# Patient Record
Sex: Male | Born: 1950 | Race: Black or African American | Hispanic: No | Marital: Married | Smoking: Never smoker
Health system: Southern US, Community
[De-identification: ages and names within clinical notes are randomized; demographics above are authoritative.]

## PROBLEM LIST (undated history)

## (undated) DIAGNOSIS — I10 Essential (primary) hypertension: Secondary | ICD-10-CM

## (undated) DIAGNOSIS — E119 Type 2 diabetes mellitus without complications: Secondary | ICD-10-CM

## (undated) DIAGNOSIS — E785 Hyperlipidemia, unspecified: Secondary | ICD-10-CM

## (undated) DIAGNOSIS — N400 Enlarged prostate without lower urinary tract symptoms: Secondary | ICD-10-CM

## (undated) HISTORY — DX: Type 2 diabetes mellitus without complications: E11.9

## (undated) HISTORY — DX: Essential (primary) hypertension: I10

## (undated) HISTORY — DX: Benign prostatic hyperplasia without lower urinary tract symptoms: N40.0

## (undated) HISTORY — DX: Hyperlipidemia, unspecified: E78.5

---

## 2012-11-16 ENCOUNTER — Encounter: Payer: Self-pay | Admitting: *Deleted

## 2013-01-29 ENCOUNTER — Encounter: Payer: Self-pay | Admitting: Nurse Practitioner

## 2013-01-29 ENCOUNTER — Ambulatory Visit (INDEPENDENT_AMBULATORY_CARE_PROVIDER_SITE_OTHER): Payer: PRIVATE HEALTH INSURANCE | Admitting: Nurse Practitioner

## 2013-01-29 VITALS — BP 159/89 | HR 86 | Temp 97.4°F | Ht 72.0 in | Wt 230.0 lb

## 2013-01-29 DIAGNOSIS — N400 Enlarged prostate without lower urinary tract symptoms: Secondary | ICD-10-CM

## 2013-01-29 DIAGNOSIS — E119 Type 2 diabetes mellitus without complications: Secondary | ICD-10-CM | POA: Insufficient documentation

## 2013-01-29 DIAGNOSIS — E1169 Type 2 diabetes mellitus with other specified complication: Secondary | ICD-10-CM | POA: Insufficient documentation

## 2013-01-29 DIAGNOSIS — I10 Essential (primary) hypertension: Secondary | ICD-10-CM

## 2013-01-29 DIAGNOSIS — E785 Hyperlipidemia, unspecified: Secondary | ICD-10-CM

## 2013-01-29 LAB — COMPLETE METABOLIC PANEL WITH GFR
ALT: 17 U/L (ref 0–53)
Alkaline Phosphatase: 59 U/L (ref 39–117)
Creat: 1.2 mg/dL (ref 0.50–1.35)
Sodium: 137 mEq/L (ref 135–145)
Total Bilirubin: 1.6 mg/dL — ABNORMAL HIGH (ref 0.3–1.2)
Total Protein: 7.2 g/dL (ref 6.0–8.3)

## 2013-01-29 LAB — POCT GLYCOSYLATED HEMOGLOBIN (HGB A1C): Hemoglobin A1C: 6.8

## 2013-01-29 MED ORDER — LISINOPRIL 20 MG PO TABS: 20.0000 mg | ORAL_TABLET | Freq: Every day | ORAL | Status: DC

## 2013-01-29 MED ORDER — ATORVASTATIN CALCIUM 40 MG PO TABS: 40.0000 mg | ORAL_TABLET | Freq: Every day | ORAL | Status: DC

## 2013-01-29 MED ORDER — METFORMIN HCL 500 MG PO TABS: 500.0000 mg | ORAL_TABLET | Freq: Two times a day (BID) | ORAL | Status: DC

## 2013-01-29 MED ORDER — AMLODIPINE BESYLATE 10 MG PO TABS: 10.0000 mg | ORAL_TABLET | Freq: Every day | ORAL | Status: DC

## 2013-01-29 MED ORDER — HYDROCHLOROTHIAZIDE 25 MG PO TABS: 25.0000 mg | ORAL_TABLET | Freq: Every day | ORAL | Status: DC

## 2013-01-29 NOTE — Patient Instructions (Addendum)
Health Maintenance, Males A healthy lifestyle and preventative care can promote health and wellness.  Maintain regular health, dental, and eye exams.  Eat a healthy diet. Foods like vegetables, fruits, whole grains, low-fat dairy products, and lean protein foods contain the nutrients you need without too many calories. Decrease your intake of foods high in solid fats, added sugars, and salt. Get information about a proper diet from your caregiver, if necessary.  Regular physical exercise is one of the most important things you can do for your health. Most adults should get at least 150 minutes of moderate-intensity exercise (any activity that increases your heart rate and causes you to sweat) each week. In addition, most adults need muscle-strengthening exercises on 2 or more days a week.   Maintain a healthy weight. The body mass index (BMI) is a screening tool to identify possible weight problems. It provides an estimate of body fat based on height and weight. Your caregiver can help determine your BMI, and can help you achieve or maintain a healthy weight. For adults 20 years and older:  A BMI below 18.5 is considered underweight.  A BMI of 18.5 to 24.9 is normal.  A BMI of 25 to 29.9 is considered overweight.  A BMI of 30 and above is considered obese.  Maintain normal blood lipids and cholesterol by exercising and minimizing your intake of saturated fat. Eat a balanced diet with plenty of fruits and vegetables. Blood tests for lipids and cholesterol should begin at age 20 and be repeated every 5 years. If your lipid or cholesterol levels are high, you are over 50, or you are a high risk for heart disease, you may need your cholesterol levels checked more frequently.Ongoing high lipid and cholesterol levels should be treated with medicines, if diet and exercise are not effective.  If you smoke, find out from your caregiver how to quit. If you do not use tobacco, do not start.  If you  choose to drink alcohol, do not exceed 2 drinks per day. One drink is considered to be 12 ounces (355 mL) of beer, 5 ounces (148 mL) of wine, or 1.5 ounces (44 mL) of liquor.  Avoid use of street drugs. Do not share needles with anyone. Ask for help if you need support or instructions about stopping the use of drugs.  High blood pressure causes heart disease and increases the risk of stroke. Blood pressure should be checked at least every 1 to 2 years. Ongoing high blood pressure should be treated with medicines if weight loss and exercise are not effective.  If you are 45 to 64 years old, ask your caregiver if you should take aspirin to prevent heart disease.  Diabetes screening involves taking a blood sample to check your fasting blood sugar level. This should be done once every 3 years, after age 45, if you are within normal weight and without risk factors for diabetes. Testing should be considered at a younger age or be carried out more frequently if you are overweight and have at least 1 risk factor for diabetes.  Colorectal cancer can be detected and often prevented. Most routine colorectal cancer screening begins at the age of 50 and continues through age 75. However, your caregiver may recommend screening at an earlier age if you have risk factors for colon cancer. On a yearly basis, your caregiver may provide home test kits to check for hidden blood in the stool. Use of a small camera at the end of a tube,   to directly examine the colon (sigmoidoscopy or colonoscopy), can detect the earliest forms of colorectal cancer. Talk to your caregiver about this at age 50, when routine screening begins. Direct examination of the colon should be repeated every 5 to 10 years through age 75, unless early forms of pre-cancerous polyps or small growths are found.  Hepatitis C blood testing is recommended for all people born from 1945 through 1965 and any individual with known risks for hepatitis C.  Healthy  men should no longer receive prostate-specific antigen (PSA) blood tests as part of routine cancer screening. Consult with your caregiver about prostate cancer screening.  Testicular cancer screening is not recommended for adolescents or adult males who have no symptoms. Screening includes self-exam, caregiver exam, and other screening tests. Consult with your caregiver about any symptoms you have or any concerns you have about testicular cancer.  Practice safe sex. Use condoms and avoid high-risk sexual practices to reduce the spread of sexually transmitted infections (STIs).  Use sunscreen with a sun protection factor (SPF) of 30 or greater. Apply sunscreen liberally and repeatedly throughout the day. You should seek shade when your shadow is shorter than you. Protect yourself by wearing long sleeves, pants, a wide-brimmed hat, and sunglasses year round, whenever you are outdoors.  Notify your caregiver of new moles or changes in moles, especially if there is a change in shape or color. Also notify your caregiver if a mole is larger than the size of a pencil eraser.  A one-time screening for abdominal aortic aneurysm (AAA) and surgical repair of large AAAs by sound wave imaging (ultrasonography) is recommended for ages 65 to 75 years who are current or former smokers.  Stay current with your immunizations. Document Released: 02/05/2008 Document Revised: 11/01/2011 Document Reviewed: 01/04/2011 [employer] Patient Information 2014 [employer].  

## 2013-01-29 NOTE — Progress Notes (Signed)
Subjective:    Patient ID: Jonathon Adkins, male    DOB: Dec 26, 1950, 62 y.o.   MRN: [id number]  Hypertension This is a chronic problem. The current episode started more than 1 year ago. The problem has been waxing and waning since onset. The problem is uncontrolled. Pertinent negatives include no blurred vision, chest pain, headaches, orthopnea, palpitations, peripheral edema or shortness of breath. There are no associated agents to hypertension. Risk factors for coronary artery disease include dyslipidemia, diabetes mellitus, male gender and obesity. Past treatments include diuretics and beta blockers. The current treatment provides moderate improvement. There are no compliance problems.   Hyperlipidemia This is a chronic problem. The current episode started more than 1 year ago. The problem is controlled. Recent lipid tests were reviewed and are normal. Exacerbating diseases include diabetes. Pertinent negatives include no chest pain or shortness of breath. Current antihyperlipidemic treatment includes statins. The current treatment provides significant improvement of lipids. There are no compliance problems.  Risk factors for coronary artery disease include diabetes mellitus, hypertension and male sex.  Diabetes He presents for his follow-up diabetic visit. He has type 2 diabetes mellitus. No MedicAlert identification noted. The initial diagnosis of diabetes was made 2 years ago. His disease course has been stable. There are no hypoglycemic associated symptoms. Pertinent negatives for hypoglycemia include no headaches. Pertinent negatives for diabetes include no blurred vision, no chest pain, no foot paresthesias, no foot ulcerations, no polydipsia, no polyphagia, no polyuria, no visual change and no weight loss. There are no hypoglycemic complications. Symptoms are stable. There are no diabetic complications. Risk factors for coronary artery disease include dyslipidemia, hypertension and male sex. Current  diabetic treatment includes oral agent (monotherapy) and diet. He is compliant with treatment all of the time. His weight is stable. He is following a generally healthy diet. When asked about meal planning, he reported none. He has not had a previous visit with a dietician. He participates in exercise daily. There is no change in his home blood glucose trend. (Patient doesn't check blood sugars at home.) An ACE inhibitor/angiotensin II receptor blocker is not being taken. He does not see a podiatrist.Eye exam current: October 2012- Patient encouraged too make appointment.  BPH Dr. Isabel Caprice- every 6 months- Last PSA was less than 0.4.    Review of Systems  Constitutional: Negative for weight loss.  Eyes: Negative for blurred vision.  Respiratory: Negative for shortness of breath.   Cardiovascular: Negative for chest pain, palpitations and orthopnea.  Endocrine: Negative for polydipsia, polyphagia and polyuria.  Neurological: Negative for headaches.  All other systems reviewed and are negative.       Objective:   Physical Exam  Constitutional: He is oriented to person, place, and time. He appears well-developed and well-nourished.  HENT:  Head: Normocephalic.  Right Ear: External ear normal.  Left Ear: External ear normal.  Nose: Nose normal.  Mouth/Throat: Oropharynx is clear and moist.  Eyes: EOM are normal. Pupils are equal, round, and reactive to light.  Neck: Normal range of motion. Neck supple. No thyromegaly present.  Cardiovascular: Normal rate, regular rhythm, normal heart sounds and intact distal pulses.   No murmur heard. Pulmonary/Chest: Effort normal and breath sounds normal. He has no wheezes. He has no rales.  Abdominal: Soft. Bowel sounds are normal.  Genitourinary:  DRE deferred to Dr. Isabel Caprice  Musculoskeletal: Normal range of motion.  Neurological: He is alert and oriented to person, place, and time.  Skin: Skin is warm and dry.  Psychiatric: He has a normal mood  and affect. His behavior is normal. Judgment and thought content normal.   BP 159/89  Pulse 86  Temp(Src) 97.4 F (36.3 C) (Oral)  Ht 6' (1.829 m)  Wt 230 lb (104.327 kg)  BMI 31.19 kg/m2 Results for orders placed in visit on 01/29/13  POCT GLYCOSYLATED HEMOGLOBIN (HGB A1C)      Result Value Range   Hemoglobin A1C 6.6 %            Assessment & Plan:   1. Diabetes   2. Other and unspecified hyperlipidemia   3. HTN (hypertension)   4. BPH (benign prostatic hyperplasia)    Orders Placed This Encounter  Procedures  . NMR Lipoprofile with Lipids  . COMPLETE METABOLIC PANEL WITH GFR  . POCT glycosylated hemoglobin (Hb A1C)   Meds ordered this encounter  Medications  . DISCONTD: amLODipine (NORVASC) 10 MG tablet    Sig: Take 10 mg by mouth daily.  Marland Kitchen DISCONTD: hydrochlorothiazide (HYDRODIURIL) 25 MG tablet    Sig: Take 25 mg by mouth daily.  Marland Kitchen DISCONTD: metFORMIN (GLUCOPHAGE) 500 MG tablet    Sig: Take 500 mg by mouth 2 (two) times daily with a meal.  . DISCONTD: atorvastatin (LIPITOR) 40 MG tablet    Sig: Take 40 mg by mouth daily.  . cholecalciferol (VITAMIN D) 1000 UNITS tablet    Sig: Take 1,000 Units by mouth daily.  . finasteride (PROSCAR) 5 MG tablet    Sig: Take 5 mg by mouth daily.  Marland Kitchen HYDROcodone-acetaminophen (VICODIN) 5-500 MG per tablet    Sig: Take 1 tablet by mouth every 6 (six) hours as needed for pain.  Marland Kitchen amLODipine (NORVASC) 10 MG tablet    Sig: Take 1 tablet (10 mg total) by mouth daily.    Dispense:  90 tablet    Refill:  1    Order Specific Question:  Supervising Provider    Answer:  Ernestina Penna [[id number]]  . atorvastatin (LIPITOR) 40 MG tablet    Sig: Take 1 tablet (40 mg total) by mouth daily.    Dispense:  90 tablet    Refill:  1    Order Specific Question:  Supervising Provider    Answer:  Ernestina Penna [[id number]]  . hydrochlorothiazide (HYDRODIURIL) 25 MG tablet    Sig: Take 1 tablet (25 mg total) by mouth daily.    Dispense:  90 tablet     Refill:  1    Order Specific Question:  Supervising Provider    Answer:  Ernestina Penna [[id number]]  . metFORMIN (GLUCOPHAGE) 500 MG tablet    Sig: Take 1 tablet (500 mg total) by mouth 2 (two) times daily with a meal.    Dispense:  180 tablet    Refill:  1    Order Specific Question:  Supervising Provider    Answer:  Ernestina Penna [[id number]]  . lisinopril (PRINIVIL,ZESTRIL) 20 MG tablet    Sig: Take 1 tablet (20 mg total) by mouth daily.    Dispense:  90 tablet    Refill:  1    Order Specific Question:  Supervising Provider    Answer:  Ernestina Penna [[id number]]   Started patient on lisinopril Diet and exercise encouraged Follow- up in 3 months  Mary-Margaret Daphine Deutscher, FNP

## 2013-01-30 LAB — NMR LIPOPROFILE WITH LIPIDS
HDL Particle Number: 36 umol/L (ref >=30.5)
HDL Size: 8.7 nm — ABNORMAL LOW (ref >=9.2)
HDL-C: 52 mg/dL (ref >=40)
LDL (calc): 58 mg/dL (ref <100)
LDL Particle Number: 768 nmol/L (ref <1000)
LDL Size: 20.9 nm (ref >20.5)
LP-IR Score: 59 — ABNORMAL HIGH (ref <=45)
VLDL Size: 50.2 nm — ABNORMAL HIGH (ref <=46.6)

## 2013-01-31 ENCOUNTER — Telehealth: Payer: Self-pay | Admitting: *Deleted

## 2013-01-31 NOTE — Telephone Encounter (Signed)
Pt has been on amlodipine and hctz but rx for lisinopril sent in was there a medication change.

## 2013-01-31 NOTE — Telephone Encounter (Signed)
Chart says on amlodipine, HCTZ and lisinopril- Call pharmacy and see how long has been on and who called in rx

## 2013-01-31 NOTE — Telephone Encounter (Signed)
Actually stated patient on lisinopril at appointment because blood pressure was elevated

## 2013-01-31 NOTE — Telephone Encounter (Signed)
PICKED UP AMLODIPINE 5/30- #90 PICKED UP HCTZ 6/9- #90   PICKED UP LISINOPRIL 20 ON 6/9- #90-BUT HASNT GOT THIS SINCE 2011 AND THEN IT WAS 10MG -PER [facility]  ??? WHAT TO DO  MMM TO ADDRESS

## 2013-01-31 NOTE — Telephone Encounter (Signed)
DUP NOTE 

## 2013-05-11 ENCOUNTER — Ambulatory Visit (INDEPENDENT_AMBULATORY_CARE_PROVIDER_SITE_OTHER): Payer: PRIVATE HEALTH INSURANCE | Admitting: Nurse Practitioner

## 2013-05-11 ENCOUNTER — Encounter: Payer: Self-pay | Admitting: Nurse Practitioner

## 2013-05-11 VITALS — BP 149/85 | HR 88 | Temp 98.0°F | Ht 72.0 in | Wt 228.0 lb

## 2013-05-11 DIAGNOSIS — E785 Hyperlipidemia, unspecified: Secondary | ICD-10-CM

## 2013-05-11 DIAGNOSIS — N390 Urinary tract infection, site not specified: Secondary | ICD-10-CM

## 2013-05-11 DIAGNOSIS — I10 Essential (primary) hypertension: Secondary | ICD-10-CM

## 2013-05-11 DIAGNOSIS — E119 Type 2 diabetes mellitus without complications: Secondary | ICD-10-CM

## 2013-05-11 DIAGNOSIS — R300 Dysuria: Secondary | ICD-10-CM

## 2013-05-11 DIAGNOSIS — N400 Enlarged prostate without lower urinary tract symptoms: Secondary | ICD-10-CM

## 2013-05-11 LAB — POCT UA - MICROSCOPIC ONLY: Crystals, Ur, HPF, POC: NEGATIVE

## 2013-05-11 LAB — POCT URINALYSIS DIPSTICK: pH, UA: 5

## 2013-05-11 LAB — POCT GLYCOSYLATED HEMOGLOBIN (HGB A1C): Hemoglobin A1C: 6.9

## 2013-05-11 LAB — POCT UA - MICROALBUMIN: Microalbumin Ur, POC: 100 mg/L

## 2013-05-11 MED ORDER — METFORMIN HCL 500 MG PO TABS: 500.0000 mg | ORAL_TABLET | Freq: Two times a day (BID) | ORAL | Status: DC

## 2013-05-11 MED ORDER — ATORVASTATIN CALCIUM 40 MG PO TABS: 40.0000 mg | ORAL_TABLET | Freq: Every day | ORAL | Status: DC

## 2013-05-11 MED ORDER — HYDROCHLOROTHIAZIDE 25 MG PO TABS: 25.0000 mg | ORAL_TABLET | Freq: Every day | ORAL | Status: DC

## 2013-05-11 MED ORDER — FINASTERIDE 5 MG PO TABS: 5.0000 mg | ORAL_TABLET | Freq: Every day | ORAL | Status: DC

## 2013-05-11 MED ORDER — CIPROFLOXACIN HCL 500 MG PO TABS: 500.0000 mg | ORAL_TABLET | Freq: Two times a day (BID) | ORAL | Status: DC

## 2013-05-11 MED ORDER — AMLODIPINE BESYLATE 10 MG PO TABS: 10.0000 mg | ORAL_TABLET | Freq: Every day | ORAL | Status: DC

## 2013-05-11 NOTE — Progress Notes (Signed)
Subjective:    Patient ID: Jonathon Adkins, male    DOB: 05-Aug-1951, 62 y.o.   MRN: [id number]  Hypertension This is a chronic problem. The current episode started more than 1 year ago. The problem has been waxing and waning since onset. The problem is uncontrolled. Pertinent negatives include no blurred vision, chest pain, headaches, orthopnea, palpitations, peripheral edema or shortness of breath. There are no associated agents to hypertension. Risk factors for coronary artery disease include dyslipidemia, diabetes mellitus, male gender, obesity and family history. Past treatments include diuretics and calcium channel blockers. The current treatment provides moderate improvement. There are no compliance problems.  There is no history of a thyroid problem. There is no history of sleep apnea.  Hyperlipidemia This is a chronic problem. The current episode started more than 1 year ago. The problem is controlled. Recent lipid tests were reviewed and are normal. Exacerbating diseases include diabetes. He has no history of hypothyroidism. Pertinent negatives include no chest pain or shortness of breath. Current antihyperlipidemic treatment includes statins. The current treatment provides significant improvement of lipids. There are no compliance problems.  Risk factors for coronary artery disease include diabetes mellitus, hypertension, male sex, family history and dyslipidemia.  Diabetes He presents for his follow-up diabetic visit. He has type 2 diabetes mellitus. No MedicAlert identification noted. The initial diagnosis of diabetes was made 2 years ago. His disease course has been stable. There are no hypoglycemic associated symptoms. Pertinent negatives for hypoglycemia include no headaches or tremors. Pertinent negatives for diabetes include no blurred vision, no chest pain, no fatigue, no foot paresthesias, no foot ulcerations, no polydipsia, no polyphagia, no polyuria, no visual change and no weight loss.  There are no hypoglycemic complications. Symptoms are stable. There are no diabetic complications. Risk factors for coronary artery disease include dyslipidemia, hypertension, male sex and family history. Current diabetic treatment includes oral agent (monotherapy) and diet. He is compliant with treatment all of the time. His weight is stable. He is following a generally healthy diet. When asked about meal planning, he reported none. He has not had a previous visit with a dietician. He participates in exercise daily. There is no change in his home blood glucose trend. His breakfast blood glucose is taken between 8-9 am. His breakfast blood glucose range is generally 90-110 mg/dl. (Patient doesn't check blood sugars at home.) An ACE inhibitor/angiotensin II receptor blocker is not being taken. He does not see a podiatrist.Eye exam is current (This year ( can't remember date)).  Dysuria  This is a new problem. The current episode started in the past 7 days (Sunday). The problem occurs intermittently. The problem has been gradually improving. The quality of the pain is described as burning. The pain is at a severity of 6/10. The pain is mild. There has been no fever. Associated symptoms include frequency and urgency. Pertinent negatives include no chills, nausea or vomiting. He has tried nothing for the symptoms. There is no history of kidney stones.  BPH Dr. Isabel Caprice- every 6 months- Last PSA was less than 0.4.    Review of Systems  Constitutional: Negative for chills, weight loss and fatigue.  Eyes: Negative for blurred vision.  Respiratory: Negative for shortness of breath.   Cardiovascular: Negative for chest pain, palpitations and orthopnea.  Gastrointestinal: Negative for nausea and vomiting.  Endocrine: Negative for polydipsia, polyphagia and polyuria.  Genitourinary: Positive for dysuria, urgency and frequency.  Neurological: Negative for tremors and headaches.  All other systems reviewed and are  negative.       Objective:   Physical Exam  Constitutional: He is oriented to person, place, and time. He appears well-developed and well-nourished.  HENT:  Head: Normocephalic.  Right Ear: External ear normal.  Left Ear: External ear normal.  Nose: Nose normal.  Mouth/Throat: Oropharynx is clear and moist.  Eyes: EOM are normal. Pupils are equal, round, and reactive to light.  Neck: Normal range of motion. Neck supple. No thyromegaly present.  Cardiovascular: Normal rate, regular rhythm, normal heart sounds and intact distal pulses.   No murmur heard. Pulmonary/Chest: Effort normal and breath sounds normal. He has no wheezes. He has no rales.  Abdominal: Soft. Bowel sounds are normal.  Genitourinary:  DRE deferred to Dr. Isabel Caprice  Musculoskeletal: Normal range of motion.  Neurological: He is alert and oriented to person, place, and time.  Skin: Skin is warm and dry.  Psychiatric: He has a normal mood and affect. His behavior is normal. Judgment and thought content normal.   BP 149/85  Pulse 88  Temp(Src) 98 F (36.7 C) (Oral)  Ht 6' (1.829 m)  Wt 228 lb (103.42 kg)  BMI 30.92 kg/m2 Results for orders placed in visit on 05/11/13  POCT GLYCOSYLATED HEMOGLOBIN (HGB A1C)      Result Value Range   Hemoglobin A1C 6.9%    POCT UA - MICROSCOPIC ONLY      Result Value Range   WBC, Ur, HPF, POC 20-40     RBC, urine, microscopic 30-40     Bacteria, U Microscopic few     Mucus, UA trace     Epithelial cells, urine per micros occ     Crystals, Ur, HPF, POC neg     Casts, Ur, LPF, POC neg     WBC Clumps few    POCT URINALYSIS DIPSTICK      Result Value Range   Color, UA yellow     Clarity, UA cloudy     Glucose, UA neg     Bilirubin, UA neg     Ketones, UA mod     Spec Grav, UA 1.020     Blood, UA mod     pH, UA 5.0     Protein, UA mod     Urobilinogen, UA negative     Nitrite, UA +     Leukocytes, UA moderate (2+)            Assessment & Plan:   1. Diabetes    2. Other and unspecified hyperlipidemia   3. HTN (hypertension)   4. Dysuria   5. BPH (benign prostatic hyperplasia)    Orders Placed This Encounter  Procedures  . Urine culture  . CMP14+EGFR  . NMR, lipoprofile  . POCT glycosylated hemoglobin (Hb A1C)  . POCT UA - Microscopic Only  . POCT urinalysis dipstick  . POCT UA - Microalbumin    Meds ordered this encounter  Medications  . metFORMIN (GLUCOPHAGE) 500 MG tablet    Sig: Take 1 tablet (500 mg total) by mouth 2 (two) times daily with a meal.    Dispense:  180 tablet    Refill:  1    Order Specific Question:  Supervising Provider    Answer:  Ernestina Penna [[id number]]  . hydrochlorothiazide (HYDRODIURIL) 25 MG tablet    Sig: Take 1 tablet (25 mg total) by mouth daily.    Dispense:  90 tablet    Refill:  1    Order Specific Question:  Supervising Provider  Answer:  Ernestina Penna [[id number]]  . finasteride (PROSCAR) 5 MG tablet    Sig: Take 1 tablet (5 mg total) by mouth daily.    Dispense:  90 tablet    Refill:  1  . atorvastatin (LIPITOR) 40 MG tablet    Sig: Take 1 tablet (40 mg total) by mouth daily.    Dispense:  90 tablet    Refill:  1    Order Specific Question:  Supervising Provider    Answer:  Ernestina Penna [[id number]]  . amLODipine (NORVASC) 10 MG tablet    Sig: Take 1 tablet (10 mg total) by mouth daily.    Dispense:  90 tablet    Refill:  1    Order Specific Question:  Supervising Provider    Answer:  Ernestina Penna [[id number]]  . ciprofloxacin (CIPRO) 500 MG tablet    Sig: Take 1 tablet (500 mg total) by mouth 2 (two) times daily.    Dispense:  10 tablet    Refill:  0    Order Specific Question:  Supervising Provider    Answer:  Deborra Medina   Continue all meds Force fluids Diet and exercise encouraged Follow- up in 3 months  Mary-Margaret Daphine Deutscher, FNP

## 2013-05-11 NOTE — Patient Instructions (Signed)
Health Maintenance, Males A healthy lifestyle and preventative care can promote health and wellness.  Maintain regular health, dental, and eye exams.  Eat a healthy diet. Foods like vegetables, fruits, whole grains, low-fat dairy products, and lean protein foods contain the nutrients you need without too many calories. Decrease your intake of foods high in solid fats, added sugars, and salt. Get information about a proper diet from your caregiver, if necessary.  Regular physical exercise is one of the most important things you can do for your health. Most adults should get at least 150 minutes of moderate-intensity exercise (any activity that increases your heart rate and causes you to sweat) each week. In addition, most adults need muscle-strengthening exercises on 2 or more days a week.   Maintain a healthy weight. The body mass index (BMI) is a screening tool to identify possible weight problems. It provides an estimate of body fat based on height and weight. Your caregiver can help determine your BMI, and can help you achieve or maintain a healthy weight. For adults 20 years and older:  A BMI below 18.5 is considered underweight.  A BMI of 18.5 to 24.9 is normal.  A BMI of 25 to 29.9 is considered overweight.  A BMI of 30 and above is considered obese.  Maintain normal blood lipids and cholesterol by exercising and minimizing your intake of saturated fat. Eat a balanced diet with plenty of fruits and vegetables. Blood tests for lipids and cholesterol should begin at age 20 and be repeated every 5 years. If your lipid or cholesterol levels are high, you are over 50, or you are a high risk for heart disease, you may need your cholesterol levels checked more frequently.Ongoing high lipid and cholesterol levels should be treated with medicines, if diet and exercise are not effective.  If you smoke, find out from your caregiver how to quit. If you do not use tobacco, do not start.  If you  choose to drink alcohol, do not exceed 2 drinks per day. One drink is considered to be 12 ounces (355 mL) of beer, 5 ounces (148 mL) of wine, or 1.5 ounces (44 mL) of liquor.  Avoid use of street drugs. Do not share needles with anyone. Ask for help if you need support or instructions about stopping the use of drugs.  High blood pressure causes heart disease and increases the risk of stroke. Blood pressure should be checked at least every 1 to 2 years. Ongoing high blood pressure should be treated with medicines if weight loss and exercise are not effective.  If you are 45 to 64 years old, ask your caregiver if you should take aspirin to prevent heart disease.  Diabetes screening involves taking a blood sample to check your fasting blood sugar level. This should be done once every 3 years, after age 45, if you are within normal weight and without risk factors for diabetes. Testing should be considered at a younger age or be carried out more frequently if you are overweight and have at least 1 risk factor for diabetes.  Colorectal cancer can be detected and often prevented. Most routine colorectal cancer screening begins at the age of 50 and continues through age 75. However, your caregiver may recommend screening at an earlier age if you have risk factors for colon cancer. On a yearly basis, your caregiver may provide home test kits to check for hidden blood in the stool. Use of a small camera at the end of a tube,   to directly examine the colon (sigmoidoscopy or colonoscopy), can detect the earliest forms of colorectal cancer. Talk to your caregiver about this at age 50, when routine screening begins. Direct examination of the colon should be repeated every 5 to 10 years through age 75, unless early forms of pre-cancerous polyps or small growths are found.  Hepatitis C blood testing is recommended for all people born from 1945 through 1965 and any individual with known risks for hepatitis C.  Healthy  men should no longer receive prostate-specific antigen (PSA) blood tests as part of routine cancer screening. Consult with your caregiver about prostate cancer screening.  Testicular cancer screening is not recommended for adolescents or adult males who have no symptoms. Screening includes self-exam, caregiver exam, and other screening tests. Consult with your caregiver about any symptoms you have or any concerns you have about testicular cancer.  Practice safe sex. Use condoms and avoid high-risk sexual practices to reduce the spread of sexually transmitted infections (STIs).  Use sunscreen with a sun protection factor (SPF) of 30 or greater. Apply sunscreen liberally and repeatedly throughout the day. You should seek shade when your shadow is shorter than you. Protect yourself by wearing long sleeves, pants, a wide-brimmed hat, and sunglasses year round, whenever you are outdoors.  Notify your caregiver of new moles or changes in moles, especially if there is a change in shape or color. Also notify your caregiver if a mole is larger than the size of a pencil eraser.  A one-time screening for abdominal aortic aneurysm (AAA) and surgical repair of large AAAs by sound wave imaging (ultrasonography) is recommended for ages 65 to 75 years who are current or former smokers.  Stay current with your immunizations. Document Released: 02/05/2008 Document Revised: 11/01/2011 Document Reviewed: 01/04/2011 [employer] Patient Information 2014 [employer].  

## 2013-05-11 NOTE — Addendum Note (Signed)
Addended by: Prescott Gum on: 05/11/2013 01:05 PM   Modules accepted: Orders

## 2013-05-13 ENCOUNTER — Other Ambulatory Visit: Payer: Self-pay | Admitting: Nurse Practitioner

## 2013-05-13 LAB — CMP14+EGFR
ALT: 21 IU/L (ref 0–44)
Albumin/Globulin Ratio: 1.7 (ref 1.1–2.5)
BUN: 19 mg/dL (ref 8–27)
Calcium: 9.4 mg/dL (ref 8.6–10.2)
Creatinine, Ser: 1.23 mg/dL (ref 0.76–1.27)
GFR calc non Af Amer: 62 mL/min/{1.73_m2} (ref >59)
Globulin, Total: 2.7 g/dL (ref 1.5–4.5)
Glucose: 146 mg/dL — ABNORMAL HIGH (ref 65–99)
Total Protein: 7.2 g/dL (ref 6.0–8.5)

## 2013-05-13 LAB — URINE CULTURE

## 2013-05-13 LAB — NMR, LIPOPROFILE
HDL Cholesterol by NMR: 51 mg/dL (ref >=40)
HDL Particle Number: 32.9 umol/L (ref >=30.5)
Small LDL Particle Number: 504 nmol/L (ref <=527)
Triglycerides by NMR: 61 mg/dL (ref <150)

## 2013-05-13 MED ORDER — LOSARTAN POTASSIUM 50 MG PO TABS: 50.0000 mg | ORAL_TABLET | Freq: Every day | ORAL | Status: DC

## 2013-08-27 ENCOUNTER — Encounter: Payer: Self-pay | Admitting: Nurse Practitioner

## 2013-08-27 ENCOUNTER — Ambulatory Visit (INDEPENDENT_AMBULATORY_CARE_PROVIDER_SITE_OTHER): Payer: PRIVATE HEALTH INSURANCE | Admitting: Nurse Practitioner

## 2013-08-27 VITALS — BP 155/81 | HR 76 | Temp 97.3°F | Ht 72.0 in | Wt 235.0 lb

## 2013-08-27 DIAGNOSIS — I10 Essential (primary) hypertension: Secondary | ICD-10-CM

## 2013-08-27 DIAGNOSIS — E785 Hyperlipidemia, unspecified: Secondary | ICD-10-CM

## 2013-08-27 DIAGNOSIS — E119 Type 2 diabetes mellitus without complications: Secondary | ICD-10-CM

## 2013-08-27 LAB — POCT GLYCOSYLATED HEMOGLOBIN (HGB A1C): Hemoglobin A1C: 6.5

## 2013-08-27 NOTE — Progress Notes (Signed)
Subjective:    Patient ID: Jonathon Adkins, male    DOB: 12-Sep-1950, 63 y.o.   MRN: [id number]  Patient here today for follow up- no changes since last visit.  Hypertension This is a chronic problem. The current episode started more than 1 year ago. The problem has been waxing and waning since onset. The problem is uncontrolled. Pertinent negatives include no blurred vision, chest pain, headaches, orthopnea, palpitations, peripheral edema or shortness of breath. There are no associated agents to hypertension. Risk factors for coronary artery disease include dyslipidemia, diabetes mellitus, male gender, obesity and family history. Past treatments include diuretics and calcium channel blockers. The current treatment provides moderate improvement. There are no compliance problems.  There is no history of a thyroid problem. There is no history of sleep apnea.  Hyperlipidemia This is a chronic problem. The current episode started more than 1 year ago. The problem is controlled. Recent lipid tests were reviewed and are normal. Exacerbating diseases include diabetes. He has no history of hypothyroidism. Pertinent negatives include no chest pain or shortness of breath. Current antihyperlipidemic treatment includes statins. The current treatment provides significant improvement of lipids. There are no compliance problems.  Risk factors for coronary artery disease include diabetes mellitus, hypertension, male sex, family history and dyslipidemia.  Diabetes He presents for his follow-up diabetic visit. He has type 2 diabetes mellitus. No MedicAlert identification noted. The initial diagnosis of diabetes was made 2 years ago. His disease course has been stable. There are no hypoglycemic associated symptoms. Pertinent negatives for hypoglycemia include no headaches or tremors. Pertinent negatives for diabetes include no blurred vision, no chest pain, no fatigue, no foot paresthesias, no foot ulcerations, no polydipsia,  no polyphagia, no polyuria, no visual change and no weight loss. There are no hypoglycemic complications. Symptoms are stable. There are no diabetic complications. Risk factors for coronary artery disease include dyslipidemia, hypertension, male sex and family history. Current diabetic treatment includes oral agent (monotherapy) and diet. He is compliant with treatment all of the time. His weight is stable. He is following a generally healthy diet. When asked about meal planning, he reported none. He has not had a previous visit with a dietician. He participates in exercise daily. There is no change in his home blood glucose trend. His breakfast blood glucose is taken between 8-9 am. His breakfast blood glucose range is generally 90-110 mg/dl. (Patient doesn't check blood sugars at home.) An ACE inhibitor/angiotensin II receptor blocker is not being taken. He does not see a podiatrist.Eye exam is current (This year ( can't remember date)).  Dysuria  This is a new problem. The current episode started in the past 7 days (Sunday). The problem occurs intermittently. The problem has been gradually improving. The quality of the pain is described as burning. The pain is at a severity of 6/10. The pain is mild. There has been no fever. Associated symptoms include frequency and urgency. Pertinent negatives include no chills, nausea or vomiting. He has tried nothing for the symptoms. There is no history of kidney stones.  BPH Dr. Risa Grill- every 6 months- Last PSA was less than 0.4.    Review of Systems  Constitutional: Negative for chills, weight loss and fatigue.  Eyes: Negative for blurred vision.  Respiratory: Negative for shortness of breath.   Cardiovascular: Negative for chest pain, palpitations and orthopnea.  Gastrointestinal: Negative for nausea and vomiting.  Endocrine: Negative for polydipsia, polyphagia and polyuria.  Genitourinary: Positive for dysuria, urgency and frequency.  Neurological:  Negative for tremors and headaches.  All other systems reviewed and are negative.       Objective:   Physical Exam  Constitutional: He is oriented to person, place, and time. He appears well-developed and well-nourished.  HENT:  Head: Normocephalic.  Right Ear: External ear normal.  Left Ear: External ear normal.  Nose: Nose normal.  Mouth/Throat: Oropharynx is clear and moist.  Eyes: EOM are normal. Pupils are equal, round, and reactive to light.  Neck: Normal range of motion. Neck supple. No thyromegaly present.  Cardiovascular: Normal rate, regular rhythm, normal heart sounds and intact distal pulses.   No murmur heard. Pulmonary/Chest: Effort normal and breath sounds normal. He has no wheezes. He has no rales.  Abdominal: Soft. Bowel sounds are normal.  Genitourinary:  DRE deferred to Dr. Risa Grill  Musculoskeletal: Normal range of motion.  Neurological: He is alert and oriented to person, place, and time.  Skin: Skin is warm and dry.  Psychiatric: He has a normal mood and affect. His behavior is normal. Judgment and thought content normal.   BP 155/81  Pulse 76  Temp(Src) 97.3 F (36.3 C) (Oral)  Ht 6' (1.829 m)  Wt 235 lb (106.595 kg)  BMI 31.86 kg/m2 Results for orders placed in visit on 08/27/13  POCT GLYCOSYLATED HEMOGLOBIN (HGB A1C)      Result Value Range   Hemoglobin A1C 6.5            Assessment & Plan:   1. Type II or unspecified type diabetes mellitus without mention of complication, not stated as uncontrolled   2. Essential hypertension, benign   3. Other and unspecified hyperlipidemia    Orders Placed This Encounter  Procedures  . CMP14+EGFR  . NMR, lipoprofile  . POCT glycosylated hemoglobin (Hb A1C)    Hemoccult cards given  Continue all meds Labs pending Diet and exercise encouraged Health maintenance reviewed Follow up in 3 months  [place], FNP

## 2013-08-27 NOTE — Patient Instructions (Signed)
Health Maintenance, Males A healthy lifestyle and preventative care can promote health and wellness.  Maintain regular health, dental, and eye exams.  Eat a healthy diet. Foods like vegetables, fruits, whole grains, low-fat dairy products, and lean protein foods contain the nutrients you need without too many calories. Decrease your intake of foods high in solid fats, added sugars, and salt. Get information about a proper diet from your caregiver, if necessary.  Regular physical exercise is one of the most important things you can do for your health. Most adults should get at least 150 minutes of moderate-intensity exercise (any activity that increases your heart rate and causes you to sweat) each week. In addition, most adults need muscle-strengthening exercises on 2 or more days a week.   Maintain a healthy weight. The body mass index (BMI) is a screening tool to identify possible weight problems. It provides an estimate of body fat based on height and weight. Your caregiver can help determine your BMI, and can help you achieve or maintain a healthy weight. For adults 20 years and older:  A BMI below 18.5 is considered underweight.  A BMI of 18.5 to 24.9 is normal.  A BMI of 25 to 29.9 is considered overweight.  A BMI of 30 and above is considered obese.  Maintain normal blood lipids and cholesterol by exercising and minimizing your intake of saturated fat. Eat a balanced diet with plenty of fruits and vegetables. Blood tests for lipids and cholesterol should begin at age 84 and be repeated every 5 years. If your lipid or cholesterol levels are high, you are over 50, or you are a high risk for heart disease, you may need your cholesterol levels checked more frequently.Ongoing high lipid and cholesterol levels should be treated with medicines, if diet and exercise are not effective.  If you smoke, find out from your caregiver how to quit. If you do not use tobacco, do not start.  Lung  cancer screening is recommended for adults aged 62 80 years who are at high risk for developing lung cancer because of a history of smoking. Yearly low-dose computed tomography (CT) is recommended for people who have at least a 30-pack-year history of smoking and are a current smoker or have quit within the past 15 years. A pack year of smoking is smoking an average of 1 pack of cigarettes a day for 1 year (for example: 1 pack a day for 30 years or 2 packs a day for 15 years). Yearly screening should continue until the smoker has stopped smoking for at least 15 years. Yearly screening should also be stopped for people who develop a health problem that would prevent them from having lung cancer treatment.  If you choose to drink alcohol, do not exceed 2 drinks per day. One drink is considered to be 12 ounces (355 mL) of beer, 5 ounces (148 mL) of wine, or 1.5 ounces (44 mL) of liquor.  Avoid use of street drugs. Do not share needles with anyone. Ask for help if you need support or instructions about stopping the use of drugs.  High blood pressure causes heart disease and increases the risk of stroke. Blood pressure should be checked at least every 1 to 2 years. Ongoing high blood pressure should be treated with medicines if weight loss and exercise are not effective.  If you are 32 to 65 years old, ask your caregiver if you should take aspirin to prevent heart disease.  Diabetes screening involves taking a blood  sample to check your fasting blood sugar level. This should be done once every 3 years, after age 80, if you are within normal weight and without risk factors for diabetes. Testing should be considered at a younger age or be carried out more frequently if you are overweight and have at least 1 risk factor for diabetes.  Colorectal cancer can be detected and often prevented. Most routine colorectal cancer screening begins at the age of 72 and continues through age 62. However, your caregiver may  recommend screening at an earlier age if you have risk factors for colon cancer. On a yearly basis, your caregiver may provide home test kits to check for hidden blood in the stool. Use of a small camera at the end of a tube, to directly examine the colon (sigmoidoscopy or colonoscopy), can detect the earliest forms of colorectal cancer. Talk to your caregiver about this at age 14, when routine screening begins. Direct examination of the colon should be repeated every 5 to 10 years through age 100, unless early forms of pre-cancerous polyps or small growths are found.  Hepatitis C blood testing is recommended for all people born from [birth year] through 1965 and any individual with known risks for hepatitis C.  Healthy men should no longer receive prostate-specific antigen (PSA) blood tests as part of routine cancer screening. Consult with your caregiver about prostate cancer screening.  Testicular cancer screening is not recommended for adolescents or adult males who have no symptoms. Screening includes self-exam, caregiver exam, and other screening tests. Consult with your caregiver about any symptoms you have or any concerns you have about testicular cancer.  Practice safe sex. Use condoms and avoid high-risk sexual practices to reduce the spread of sexually transmitted infections (STIs).  Use sunscreen. Apply sunscreen liberally and repeatedly throughout the day. You should seek shade when your shadow is shorter than you. Protect yourself by wearing long sleeves, pants, a wide-brimmed hat, and sunglasses year round, whenever you are outdoors.  Notify your caregiver of new moles or changes in moles, especially if there is a change in shape or color. Also notify your caregiver if a mole is larger than the size of a pencil eraser.  A one-time screening for abdominal aortic aneurysm (AAA) and surgical repair of large AAAs by sound wave imaging (ultrasonography) is recommended for ages 8 to 68 years who are  current or former smokers.  Stay current with your immunizations. Document Released: 02/05/2008 Document Revised: 12/04/2012 Document Reviewed: 01/04/2011 [facility] Patient Information 2014 [place], [place].

## 2013-08-28 LAB — NMR, LIPOPROFILE
Cholesterol: 148 mg/dL (ref <200)
HDL Cholesterol by NMR: 55 mg/dL (ref >=40)
HDL Particle Number: 37.4 umol/L (ref >=30.5)
LDL PARTICLE NUMBER: 1149 nmol/L — AB (ref <1000)
LDL Size: 20.6 nm (ref >20.5)
LDLC SERPL CALC-MCNC: 80 mg/dL (ref <100)
LP-IR Score: 46 — ABNORMAL HIGH (ref <=45)
Small LDL Particle Number: 675 nmol/L — ABNORMAL HIGH (ref <=527)
TRIGLYCERIDES BY NMR: 64 mg/dL (ref <150)

## 2013-08-28 LAB — CMP14+EGFR
ALT: 18 IU/L (ref 0–44)
AST: 18 IU/L (ref 0–40)
Albumin/Globulin Ratio: 1.4 (ref 1.1–2.5)
Albumin: 4 g/dL (ref 3.6–4.8)
Alkaline Phosphatase: 64 IU/L (ref 39–117)
BILIRUBIN TOTAL: 1 mg/dL (ref 0.0–1.2)
BUN/Creatinine Ratio: 14 (ref 10–22)
BUN: 18 mg/dL (ref 8–27)
CALCIUM: 9.7 mg/dL (ref 8.6–10.2)
CHLORIDE: 103 mmol/L (ref 97–108)
CO2: 25 mmol/L (ref 18–29)
Creatinine, Ser: 1.33 mg/dL — ABNORMAL HIGH (ref 0.76–1.27)
GFR calc Af Amer: 65 mL/min/{1.73_m2} (ref >59)
GFR calc non Af Amer: 56 mL/min/{1.73_m2} — ABNORMAL LOW (ref >59)
Globulin, Total: 2.9 g/dL (ref 1.5–4.5)
Glucose: 161 mg/dL — ABNORMAL HIGH (ref 65–99)
POTASSIUM: 4 mmol/L (ref 3.5–5.2)
SODIUM: 141 mmol/L (ref 134–144)
TOTAL PROTEIN: 6.9 g/dL (ref 6.0–8.5)

## 2013-08-31 ENCOUNTER — Other Ambulatory Visit: Payer: Self-pay

## 2013-09-05 ENCOUNTER — Telehealth: Payer: Self-pay | Admitting: *Deleted

## 2013-09-05 NOTE — Telephone Encounter (Signed)
Labs discussed  

## 2013-10-18 LAB — SPECIMEN STATUS REPORT

## 2013-10-18 LAB — FECAL OCCULT BLOOD, IMMUNOCHEMICAL: FECAL OCCULT BLD: POSITIVE — AB

## 2013-10-25 ENCOUNTER — Encounter: Payer: Self-pay | Admitting: *Deleted

## 2013-10-31 ENCOUNTER — Telehealth: Payer: Self-pay | Admitting: Nurse Practitioner

## 2013-10-31 NOTE — Telephone Encounter (Signed)
Patient had hemocult cards and they were positive mmm wants him to repeat and patient will come by and pick and repeat and bring back to [place]

## 2013-11-14 ENCOUNTER — Other Ambulatory Visit: Payer: Self-pay | Admitting: *Deleted

## 2013-11-14 DIAGNOSIS — N400 Enlarged prostate without lower urinary tract symptoms: Secondary | ICD-10-CM

## 2013-11-14 MED ORDER — FINASTERIDE 5 MG PO TABS: 5.0000 mg | ORAL_TABLET | Freq: Every day | ORAL | Status: DC

## 2013-11-26 ENCOUNTER — Encounter: Payer: Self-pay | Admitting: Nurse Practitioner

## 2013-11-26 ENCOUNTER — Ambulatory Visit (INDEPENDENT_AMBULATORY_CARE_PROVIDER_SITE_OTHER): Payer: PRIVATE HEALTH INSURANCE | Admitting: Nurse Practitioner

## 2013-11-26 VITALS — BP 147/84 | HR 74 | Temp 96.7°F | Ht 72.0 in | Wt 231.0 lb

## 2013-11-26 DIAGNOSIS — I10 Essential (primary) hypertension: Secondary | ICD-10-CM

## 2013-11-26 DIAGNOSIS — N400 Enlarged prostate without lower urinary tract symptoms: Secondary | ICD-10-CM

## 2013-11-26 DIAGNOSIS — E119 Type 2 diabetes mellitus without complications: Secondary | ICD-10-CM

## 2013-11-26 DIAGNOSIS — E559 Vitamin D deficiency, unspecified: Secondary | ICD-10-CM

## 2013-11-26 DIAGNOSIS — E785 Hyperlipidemia, unspecified: Secondary | ICD-10-CM

## 2013-11-26 DIAGNOSIS — Z1212 Encounter for screening for malignant neoplasm of rectum: Secondary | ICD-10-CM

## 2013-11-26 LAB — POCT GLYCOSYLATED HEMOGLOBIN (HGB A1C)

## 2013-11-26 MED ORDER — ATORVASTATIN CALCIUM 40 MG PO TABS: 40.0000 mg | ORAL_TABLET | Freq: Every day | ORAL | Status: DC

## 2013-11-26 MED ORDER — VITAMIN D 1000 UNITS PO TABS: 1000.0000 [IU] | ORAL_TABLET | Freq: Every day | ORAL | Status: AC

## 2013-11-26 MED ORDER — AMLODIPINE BESYLATE 10 MG PO TABS: 10.0000 mg | ORAL_TABLET | Freq: Every day | ORAL | Status: DC

## 2013-11-26 MED ORDER — LOSARTAN POTASSIUM 50 MG PO TABS: 50.0000 mg | ORAL_TABLET | Freq: Every day | ORAL | Status: DC

## 2013-11-26 MED ORDER — HYDROCHLOROTHIAZIDE 25 MG PO TABS: 25.0000 mg | ORAL_TABLET | Freq: Every day | ORAL | Status: DC

## 2013-11-26 MED ORDER — METFORMIN HCL 500 MG PO TABS: 500.0000 mg | ORAL_TABLET | Freq: Two times a day (BID) | ORAL | Status: DC

## 2013-11-26 NOTE — Patient Instructions (Signed)

## 2013-11-26 NOTE — Progress Notes (Signed)
Subjective:    Patient ID: Jonathon Adkins, male    DOB: 1951-08-11, 63 y.o.   MRN: [id number]  Patient here today for follow up- no changes since last visit.  Hypertension This is a chronic problem. The current episode started more than 1 year ago. The problem has been waxing and waning since onset. The problem is uncontrolled. Pertinent negatives include no blurred vision, chest pain, headaches, orthopnea, palpitations, peripheral edema or shortness of breath. There are no associated agents to hypertension. Risk factors for coronary artery disease include dyslipidemia, diabetes mellitus, male gender, obesity and family history. Past treatments include diuretics and calcium channel blockers. The current treatment provides moderate improvement. There are no compliance problems.  There is no history of a thyroid problem. There is no history of sleep apnea.  Hyperlipidemia This is a chronic problem. The current episode started more than 1 year ago. The problem is controlled. Recent lipid tests were reviewed and are normal. Exacerbating diseases include diabetes. He has no history of hypothyroidism. Pertinent negatives include no chest pain or shortness of breath. Current antihyperlipidemic treatment includes statins. The current treatment provides significant improvement of lipids. There are no compliance problems.  Risk factors for coronary artery disease include diabetes mellitus, hypertension, male sex, family history and dyslipidemia.  Diabetes He presents for his follow-up diabetic visit. He has type 2 diabetes mellitus. No MedicAlert identification noted. The initial diagnosis of diabetes was made 2 years ago. His disease course has been stable. There are no hypoglycemic associated symptoms. Pertinent negatives for hypoglycemia include no headaches or tremors. Pertinent negatives for diabetes include no blurred vision, no chest pain, no fatigue, no foot paresthesias, no foot ulcerations, no polydipsia,  no polyphagia, no polyuria, no visual change and no weight loss. There are no hypoglycemic complications. Symptoms are stable. There are no diabetic complications. Risk factors for coronary artery disease include dyslipidemia, hypertension, male sex and family history. Current diabetic treatment includes oral agent (monotherapy) and diet. He is compliant with treatment all of the time. His weight is stable. He is following a generally healthy diet. When asked about meal planning, he reported none. He has not had a previous visit with a dietician. He participates in exercise daily. There is no change in his home blood glucose trend. His breakfast blood glucose is taken between 8-9 am. His breakfast blood glucose range is generally 90-110 mg/dl. (Patient doesn't check blood sugars at home.) An ACE inhibitor/angiotensin II receptor blocker is not being taken. He does not see a podiatrist.Eye exam is current (This year ( can't remember date)).  Dysuria  This is a new problem. The current episode started in the past 7 days (Sunday). The problem occurs intermittently. The problem has been gradually improving. The quality of the pain is described as burning. The pain is at a severity of 6/10. The pain is mild. There has been no fever. Associated symptoms include frequency and urgency. Pertinent negatives include no chills, nausea or vomiting. He has tried nothing for the symptoms. There is no history of kidney stones.  BPH Dr. Isabel Caprice- every 6 months- Last PSA was less than 0.4.    Review of Systems  Constitutional: Negative for chills, weight loss and fatigue.  Eyes: Negative for blurred vision.  Respiratory: Negative for shortness of breath.   Cardiovascular: Negative for chest pain, palpitations and orthopnea.  Gastrointestinal: Negative for nausea and vomiting.  Endocrine: Negative for polydipsia, polyphagia and polyuria.  Genitourinary: Positive for dysuria, urgency and frequency.  Neurological:  Negative for tremors and headaches.  All other systems reviewed and are negative.       Objective:   Physical Exam  Constitutional: He is oriented to person, place, and time. He appears well-developed and well-nourished.  HENT:  Head: Normocephalic.  Right Ear: External ear normal.  Left Ear: External ear normal.  Nose: Nose normal.  Mouth/Throat: Oropharynx is clear and moist.  Eyes: EOM are normal. Pupils are equal, round, and reactive to light.  Neck: Normal range of motion. Neck supple. No thyromegaly present.  Cardiovascular: Normal rate, regular rhythm, normal heart sounds and intact distal pulses.   No murmur heard. Pulmonary/Chest: Effort normal and breath sounds normal. He has no wheezes. He has no rales.  Abdominal: Soft. Bowel sounds are normal.  Genitourinary:  DRE deferred to Dr. Risa Grill  Musculoskeletal: Normal range of motion.  Neurological: He is alert and oriented to person, place, and time.  Skin: Skin is warm and dry.  Psychiatric: He has a normal mood and affect. His behavior is normal. Judgment and thought content normal.   BP 147/84  Pulse 74  Temp(Src) 96.7 F (35.9 C) (Oral)  Ht 6' (1.829 m)  Wt 231 lb (104.781 kg)  BMI 31.32 kg/m2 Results for orders placed in visit on 11/26/13  POCT GLYCOSYLATED HEMOGLOBIN (HGB A1C)      Result Value Ref Range   Hemoglobin A1C 6.6%            Assessment & Plan:   1. Screening for malignant neoplasm of the rectum   2. BPH (benign prostatic hyperplasia)   3. Diabetes   4. HTN (hypertension)   5. Other and unspecified hyperlipidemia   6. Unspecified vitamin D deficiency    Orders Placed This Encounter  Procedures  . Fecal occult blood, imunochemical  . CMP14+EGFR  . NMR, lipoprofile  . POCT glycosylated hemoglobin (Hb A1C)   Meds ordered this encounter  Medications  . metFORMIN (GLUCOPHAGE) 500 MG tablet    Sig: Take 1 tablet (500 mg total) by mouth 2 (two) times daily with a meal.    Dispense:   180 tablet    Refill:  1    Order Specific Question:  Supervising Provider    Answer:  Chipper Herb [[id number]]  . amLODipine (NORVASC) 10 MG tablet    Sig: Take 1 tablet (10 mg total) by mouth daily.    Dispense:  90 tablet    Refill:  1    Order Specific Question:  Supervising Provider    Answer:  Chipper Herb [[id number]]  . hydrochlorothiazide (HYDRODIURIL) 25 MG tablet    Sig: Take 1 tablet (25 mg total) by mouth daily.    Dispense:  90 tablet    Refill:  1    Order Specific Question:  Supervising Provider    Answer:  Chipper Herb [[id number]]  . atorvastatin (LIPITOR) 40 MG tablet    Sig: Take 1 tablet (40 mg total) by mouth daily.    Dispense:  90 tablet    Refill:  1    Order Specific Question:  Supervising Provider    Answer:  Chipper Herb [[id number]]  . losartan (COZAAR) 50 MG tablet    Sig: Take 1 tablet (50 mg total) by mouth daily.    Dispense:  90 tablet    Refill:  1    Order Specific Question:  Supervising Provider    Answer:  Chipper Herb [[id number]]  . cholecalciferol (VITAMIN D) 1000 UNITS tablet  Sig: Take 1 tablet (1,000 Units total) by mouth daily.    Dispense:  90 tablet    Refill:  1    Order Specific Question:  Supervising Provider    Answer:  Chipper Herb [[id number]]    Labs pending Health maintenance reviewed Diet and exercise encouraged Continue all meds Follow up  In 3 months    [place], FNP

## 2013-11-28 ENCOUNTER — Other Ambulatory Visit: Payer: Self-pay | Admitting: Nurse Practitioner

## 2013-11-28 LAB — CMP14+EGFR
ALK PHOS: 71 IU/L (ref 39–117)
ALT: 16 IU/L (ref 0–44)
AST: 11 IU/L (ref 0–40)
Albumin/Globulin Ratio: 1.6 (ref 1.1–2.5)
Albumin: 4.4 g/dL (ref 3.6–4.8)
BUN/Creatinine Ratio: 11 (ref 10–22)
BUN: 13 mg/dL (ref 8–27)
CALCIUM: 10 mg/dL (ref 8.6–10.2)
CHLORIDE: 100 mmol/L (ref 97–108)
CO2: 24 mmol/L (ref 18–29)
CREATININE: 1.23 mg/dL (ref 0.76–1.27)
GFR calc Af Amer: 71 mL/min/{1.73_m2} (ref >59)
GFR calc non Af Amer: 62 mL/min/{1.73_m2} (ref >59)
GLOBULIN, TOTAL: 2.8 g/dL (ref 1.5–4.5)
Glucose: 144 mg/dL — ABNORMAL HIGH (ref 65–99)
POTASSIUM: 4.2 mmol/L (ref 3.5–5.2)
Sodium: 141 mmol/L (ref 134–144)
Total Bilirubin: 0.9 mg/dL (ref 0.0–1.2)
Total Protein: 7.2 g/dL (ref 6.0–8.5)

## 2013-11-28 LAB — NMR, LIPOPROFILE
Cholesterol: 135 mg/dL (ref <200)
HDL CHOLESTEROL BY NMR: 49 mg/dL (ref >=40)
HDL Particle Number: 34.7 umol/L (ref >=30.5)
LDL PARTICLE NUMBER: 1037 nmol/L — AB (ref <1000)
LDL Size: 20.6 nm (ref >20.5)
LDLC SERPL CALC-MCNC: 72 mg/dL (ref <100)
LP-IR SCORE: 68 — AB (ref <=45)
Small LDL Particle Number: 578 nmol/L — ABNORMAL HIGH (ref <=527)
Triglycerides by NMR: 69 mg/dL (ref <150)

## 2013-11-28 LAB — FECAL OCCULT BLOOD, IMMUNOCHEMICAL: FECAL OCCULT BLD: POSITIVE — AB

## 2013-12-03 ENCOUNTER — Telehealth: Payer: Self-pay | Admitting: Nurse Practitioner

## 2013-12-03 DIAGNOSIS — R195 Other fecal abnormalities: Secondary | ICD-10-CM

## 2013-12-03 NOTE — Telephone Encounter (Signed)
Heme positive stool. Patient needs to see a GI. Any preference? Left message for patient to return call.

## 2013-12-10 ENCOUNTER — Encounter: Payer: Self-pay | Admitting: *Deleted

## 2013-12-12 NOTE — Telephone Encounter (Signed)
Patient prefers [facility]. Referral placed.

## 2013-12-18 ENCOUNTER — Encounter (INDEPENDENT_AMBULATORY_CARE_PROVIDER_SITE_OTHER): Payer: Self-pay | Admitting: *Deleted

## 2014-01-09 ENCOUNTER — Other Ambulatory Visit (INDEPENDENT_AMBULATORY_CARE_PROVIDER_SITE_OTHER): Payer: Self-pay | Admitting: *Deleted

## 2014-01-09 ENCOUNTER — Encounter (INDEPENDENT_AMBULATORY_CARE_PROVIDER_SITE_OTHER): Payer: Self-pay | Admitting: Internal Medicine

## 2014-01-09 ENCOUNTER — Ambulatory Visit (INDEPENDENT_AMBULATORY_CARE_PROVIDER_SITE_OTHER): Payer: PRIVATE HEALTH INSURANCE | Admitting: Internal Medicine

## 2014-01-09 ENCOUNTER — Telehealth (INDEPENDENT_AMBULATORY_CARE_PROVIDER_SITE_OTHER): Payer: Self-pay | Admitting: *Deleted

## 2014-01-09 VITALS — BP 148/70 | HR 72 | Temp 98.1°F | Ht 73.0 in | Wt 234.3 lb

## 2014-01-09 DIAGNOSIS — R195 Other fecal abnormalities: Secondary | ICD-10-CM

## 2014-01-09 DIAGNOSIS — Z1211 Encounter for screening for malignant neoplasm of colon: Secondary | ICD-10-CM

## 2014-01-09 MED ORDER — PEG-KCL-NACL-NASULF-NA ASC-C 100 G PO SOLR: 1.0000 | Freq: Once | ORAL | Status: DC

## 2014-01-09 NOTE — Telephone Encounter (Signed)
Patient needs movi prep 

## 2014-01-09 NOTE — Patient Instructions (Signed)
Colonoscopy with Dr. Rehman. The risks and benefits such as perforation, bleeding, and infection were reviewed with the patient and is agreeable. 

## 2014-01-09 NOTE — Progress Notes (Signed)
Subjective:     Patient ID: Jonathon Adkins, male   DOB: 30-Jul-1951, 63 y.o.   MRN: [id number]  HPI Referred to our office by Dr Laurance Flatten for hem positive stools. Has had 2 positive stool cards over the past 4 months. He tells me has has never noticed any blood. There has been no change in his stools. Stools are usually brown in color. No black stools. Appetite is good. No weight loss. No abdominal pain. Sometimes he will have rectal pain if he sits on something hard. He has never undergone a colonoscopy in the past.  Review of Systems see hpi      Past Medical History  Diagnosis Date  . Hypertension   . Hyperlipidemia   . Diabetes mellitus without complication   . Enlarged prostate     History reviewed. No pertinent past surgical history.  Allergies  Allergen Reactions  . Codeine   . Lisinopril     Current Outpatient Prescriptions on File Prior to Visit  Medication Sig Dispense Refill  . amLODipine (NORVASC) 10 MG tablet Take 1 tablet (10 mg total) by mouth daily.  90 tablet  1  . atorvastatin (LIPITOR) 40 MG tablet Take 1 tablet (40 mg total) by mouth daily.  90 tablet  1  . cholecalciferol (VITAMIN D) 1000 UNITS tablet Take 1 tablet (1,000 Units total) by mouth daily.  90 tablet  1  . finasteride (PROSCAR) 5 MG tablet Take 1 tablet (5 mg total) by mouth daily.  90 tablet  0  . losartan (COZAAR) 50 MG tablet Take 1 tablet (50 mg total) by mouth daily.  90 tablet  1  . metFORMIN (GLUCOPHAGE) 500 MG tablet Take 1 tablet (500 mg total) by mouth 2 (two) times daily with a meal.  180 tablet  1  . hydrochlorothiazide (HYDRODIURIL) 25 MG tablet Take 1 tablet (25 mg total) by mouth daily.  90 tablet  1   No current facility-administered medications on file prior to visit.   Married, one child in good health.  Objective:   Physical Exam  Filed Vitals:   01/09/14 1056  BP: 148/70  Pulse: 72  Temp: 98.1 F (36.7 C)  Height: 6\' 1"  (1.854 m)  Weight: 234 lb 4.8 oz (106.278 kg)    Alert and oriented. Skin warm and dry. Oral mucosa is moist.   . Sclera anicteric, conjunctivae is pink. Thyroid not enlarged. No cervical lymphadenopathy. Lungs clear. Heart regular rate and rhythm.  Abdomen is soft. Bowel sounds are positive. No hepatomegaly. No abdominal masses felt. No tenderness.  No edema to lower extremities. Stool brown and guaiac negative.      Assessment:     Heme positive stool. Colonic neoplasm needs to be ruled out. Colonic polyps are also in the differential. Patient has never undergone a colonoscopy in the past.    Plan:     Colonoscopy with Dr. Laural Golden. The risks and benefits such as perforation, bleeding, and infection were reviewed with the patient and is agreeable.

## 2014-01-22 ENCOUNTER — Encounter (HOSPITAL_COMMUNITY): Payer: Self-pay | Admitting: Pharmacy Technician

## 2014-02-06 ENCOUNTER — Encounter (HOSPITAL_COMMUNITY)
Admission: RE | Disposition: A | Discharge status: Home / Self Care | Payer: Self-pay | Source: Ambulatory Visit | Attending: Internal Medicine

## 2014-02-06 ENCOUNTER — Ambulatory Visit (HOSPITAL_COMMUNITY)
Admission: RE | Admit: 2014-02-06 | Discharge: 2014-02-06 | Disposition: A | Discharge status: Home / Self Care | Payer: PRIVATE HEALTH INSURANCE | Source: Ambulatory Visit | Attending: Internal Medicine | Admitting: Internal Medicine

## 2014-02-06 ENCOUNTER — Encounter (HOSPITAL_COMMUNITY): Payer: Self-pay | Admitting: *Deleted

## 2014-02-06 DIAGNOSIS — E785 Hyperlipidemia, unspecified: Secondary | ICD-10-CM | POA: Insufficient documentation

## 2014-02-06 DIAGNOSIS — D126 Benign neoplasm of colon, unspecified: Secondary | ICD-10-CM | POA: Insufficient documentation

## 2014-02-06 DIAGNOSIS — Z885 Allergy status to narcotic agent status: Secondary | ICD-10-CM | POA: Insufficient documentation

## 2014-02-06 DIAGNOSIS — K5730 Diverticulosis of large intestine without perforation or abscess without bleeding: Secondary | ICD-10-CM | POA: Insufficient documentation

## 2014-02-06 DIAGNOSIS — Z888 Allergy status to other drugs, medicaments and biological substances status: Secondary | ICD-10-CM | POA: Insufficient documentation

## 2014-02-06 DIAGNOSIS — I10 Essential (primary) hypertension: Secondary | ICD-10-CM | POA: Insufficient documentation

## 2014-02-06 DIAGNOSIS — E119 Type 2 diabetes mellitus without complications: Secondary | ICD-10-CM | POA: Insufficient documentation

## 2014-02-06 DIAGNOSIS — K921 Melena: Secondary | ICD-10-CM | POA: Insufficient documentation

## 2014-02-06 DIAGNOSIS — N400 Enlarged prostate without lower urinary tract symptoms: Secondary | ICD-10-CM | POA: Insufficient documentation

## 2014-02-06 DIAGNOSIS — Z79899 Other long term (current) drug therapy: Secondary | ICD-10-CM | POA: Insufficient documentation

## 2014-02-06 HISTORY — PX: COLONOSCOPY: SHX5424

## 2014-02-06 LAB — GLUCOSE, CAPILLARY: Glucose-Capillary: 123 mg/dL — ABNORMAL HIGH (ref 70–99)

## 2014-02-06 SURGERY — COLONOSCOPY
Anesthesia: Moderate Sedation

## 2014-02-06 MED ORDER — MEPERIDINE HCL 50 MG/ML IJ SOLN
INTRAMUSCULAR | Status: AC
  Filled 2014-02-06: qty 1

## 2014-02-06 MED ORDER — STERILE WATER FOR IRRIGATION IR SOLN
Status: DC | PRN
  Administered 2014-02-06: 13:00:00

## 2014-02-06 MED ORDER — MIDAZOLAM HCL 5 MG/5ML IJ SOLN
INTRAMUSCULAR | Status: DC | PRN
  Administered 2014-02-06: 2 mg via INTRAVENOUS
  Administered 2014-02-06: 1 mg via INTRAVENOUS
  Administered 2014-02-06: 2 mg via INTRAVENOUS

## 2014-02-06 MED ORDER — MEPERIDINE HCL 50 MG/ML IJ SOLN
INTRAMUSCULAR | Status: DC | PRN
  Administered 2014-02-06 (×2): 25 mg via INTRAVENOUS

## 2014-02-06 MED ORDER — MIDAZOLAM HCL 5 MG/5ML IJ SOLN
INTRAMUSCULAR | Status: AC
  Filled 2014-02-06: qty 10

## 2014-02-06 MED ORDER — SPOT INK MARKER SYRINGE KIT
PACK | SUBMUCOSAL | Status: DC | PRN
  Administered 2014-02-06: 1 mL via SUBMUCOSAL

## 2014-02-06 NOTE — Op Note (Signed)
COLONOSCOPY PROCEDURE REPORT  PATIENT:  Jonathon Adkins  MR#:  [id number] Birthdate:  [birth year], 63 y.o., male Endoscopist:  Dr. Rogene Houston, MD Referred By:  Ms. Chevis Pretty, FNP  Procedure Date: 02/06/2014  Procedure:   Colonoscopy with snare polypectomy.  Indications:  Patient is 63 year old African American male who has heme positive stools. He has no GI symptoms. He does not take NSAIDs.  Informed Consent:  The procedure and risks were reviewed with the patient and informed consent was obtained.  Medications:  Demerol 50 mg IV Versed 5 mg IV  Description of procedure:  After a digital rectal exam was performed, that colonoscope was advanced from the anus through the rectum and colon to the area of the cecum, ileocecal valve and appendiceal orifice. The cecum was deeply intubated. These structures were well-seen and photographed for the record. From the level of the cecum and ileocecal valve, the scope was slowly and cautiously withdrawn. The mucosal surfaces were carefully surveyed utilizing scope tip to flexion to facilitate fold flattening as needed. The scope was pulled down into the rectum where a thorough exam including retroflexion was performed.  Findings:   Prep satisfactory. 7 mm polyp snared from cecum. 20 mm polyp covered with fresh blood on a short stalk snared from proximal sigmoid colon. Spot dye injected proximal to polypectomy site. 3 mm polyp on a stalk removed using snare and submitted separately. Mild sigmoid colon diverticulosis.   Therapeutic/Diagnostic Maneuvers Performed:  See above  Complications: None  Cecal Withdrawal Time:  24  minutes  Impression:  Examination performed to cecum. 7 mm polyp snared from cecum. 20 mm polyp with stigmata of bleeding snared from proximal sigmoid colon. Spot dye injected proximal to polypectomy site. Small polyp snared from stalk of large sigmoid colon polyp Mild sigmoid colon  diverticulosis   Recommendations:  Standard instructions given. No aspirin or NSAIDs for 10 days. I will contact patient with biopsy results and further recommendations.  REHMAN,NAJEEB U  02/06/2014 1:33 PM  CC: Dr. Redge Gainer, MD & Dr. Rayne Du ref. provider found

## 2014-02-06 NOTE — H&P (Addendum)
Jonathon Adkins is an 63 y.o. male.   Chief Complaint: Patient's here for colonoscopy. HPI: Patient is 63 year old African male who was recently found to have heme-positive stool therefore referred over for colonoscopy. He denies rectal bleeding or melena. He has had intermittent constipation secondary to his medications but he is trying to control dietary measures. Patient does not take OTC NSAIDs.  Family history is negative for CRC.  Past Medical History  Diagnosis Date  . Hypertension   . Hyperlipidemia   . Diabetes mellitus without complication   . Enlarged prostate     History reviewed. No pertinent past surgical history.  Family History  Problem Relation Age of Onset  . Diabetes Mother   . Coronary artery disease Father    Social History:  reports that he has never smoked. He does not have any smokeless tobacco history on file. He reports that he does not drink alcohol or use illicit drugs.  Allergies:  Allergies  Allergen Reactions  . Codeine   . Lisinopril     Medications Prior to Admission  Medication Sig Dispense Refill  . acetaminophen (TYLENOL) 500 MG chewable tablet Chew 500 mg by mouth every 6 (six) hours as needed for pain.      Marland Kitchen amLODipine (NORVASC) 10 MG tablet Take 1 tablet (10 mg total) by mouth daily.  90 tablet  1  . atorvastatin (LIPITOR) 40 MG tablet Take 1 tablet (40 mg total) by mouth daily.  90 tablet  1  . cholecalciferol (VITAMIN D) 1000 UNITS tablet Take 1 tablet (1,000 Units total) by mouth daily.  90 tablet  1  . finasteride (PROSCAR) 5 MG tablet Take 1 tablet (5 mg total) by mouth daily.  90 tablet  0  . hydrochlorothiazide (HYDRODIURIL) 25 MG tablet Take 1 tablet (25 mg total) by mouth daily.  90 tablet  1  . losartan (COZAAR) 50 MG tablet Take 1 tablet (50 mg total) by mouth daily.  90 tablet  1  . metFORMIN (GLUCOPHAGE) 500 MG tablet Take 1 tablet (500 mg total) by mouth 2 (two) times daily with a meal.  180 tablet  1    Results for orders  placed during the hospital encounter of 02/06/14 (from the past 48 hour(s))  GLUCOSE, CAPILLARY     Status: Abnormal   Collection Time    02/06/14 11:46 AM      Result Value Ref Range   Glucose-Capillary 123 (*) 70 - 99 mg/dL   Comment 1 Documented in Chart     No results found.  ROS  Blood pressure 150/96, pulse 90, temperature 98.2 F (36.8 C), temperature source Oral, resp. rate 18, height 6\' 1"  (1.854 m), weight 234 lb (106.142 kg), SpO2 97.00%. Physical Exam  Constitutional: He appears well-developed and well-nourished.  HENT:  Mouth/Throat: Oropharynx is clear and moist.  Eyes: Conjunctivae are normal. No scleral icterus.  Neck: No thyromegaly present.  Cardiovascular: Normal rate, regular rhythm and normal heart sounds.   No murmur heard. Respiratory: Effort normal and breath sounds normal.  GI: Soft. He exhibits no distension and no mass. There is no tenderness.  Musculoskeletal: He exhibits no edema.  Lymphadenopathy:    He has no cervical adenopathy.  Neurological: He is alert.  Skin: Skin is warm and dry.     Assessment/Plan Heme-positive stool. Diagnostic colonoscopy.  Jonathon Adkins U 02/06/2014, 12:41 PM

## 2014-02-06 NOTE — Discharge Instructions (Signed)
Resume usual medications and high fiber diet. No aspirin or NSAIDs for 10 days. No driving for 24 hours. Physician will call with biopsy results    Colonoscopy, Care After Refer to this sheet in the next few weeks. These instructions provide you with information on caring for yourself after your procedure. Your health care provider may also give you more specific instructions. Your treatment has been planned according to current medical practices, but problems sometimes occur. Call your health care provider if you have any problems or questions after your procedure. WHAT TO EXPECT AFTER THE PROCEDURE  After your procedure, it is typical to have the following:  A small amount of blood in your stool.  Moderate amounts of gas and mild abdominal cramping or bloating. HOME CARE INSTRUCTIONS  Do not drive, operate machinery, or sign important documents for 24 hours.  You may shower and resume your regular physical activities, but move at a slower pace for the first 24 hours.  Take frequent rest periods for the first 24 hours.  Walk around or put a warm pack on your abdomen to help reduce abdominal cramping and bloating.  Drink enough fluids to keep your urine clear or pale yellow.  You may resume your normal diet as instructed by your health care provider. Avoid heavy or fried foods that are hard to digest.  Avoid drinking alcohol for 24 hours or as instructed by your health care provider.  Only take over-the-counter or prescription medicines as directed by your health care provider.  If a tissue sample (biopsy) was taken during your procedure:  Do not take aspirin or blood thinners for 7 days, or as instructed by your health care provider.  Do not drink alcohol for 7 days, or as instructed by your health care provider.  Eat soft foods for the first 24 hours. SEEK MEDICAL CARE IF: You have persistent spotting of blood in your stool 2-3 days after the procedure. SEEK IMMEDIATE  MEDICAL CARE IF:  You have more than a small spotting of blood in your stool.  You pass large blood clots in your stool.  Your abdomen is swollen (distended).  You have nausea or vomiting.  You have a fever.  You have increasing abdominal pain that is not relieved with medicine. Document Released: 03/23/2004 Document Revised: 05/30/2013 Document Reviewed: 04/16/2013 [facility] Patient Information 2015 [place], [place]. This information is not intended to replace advice given to you by your health care provider. Make sure you discuss any questions you have with your health care provider. High-Fiber Diet Fiber is found in fruits, vegetables, and grains. A high-fiber diet encourages the addition of more whole grains, legumes, fruits, and vegetables in your diet. The recommended amount of fiber for adult males is 38 g per day. For adult females, it is 25 g per day. Pregnant and lactating women should get 28 g of fiber per day. If you have a digestive or bowel problem, ask your caregiver for advice before adding high-fiber foods to your diet. Eat a variety of high-fiber foods instead of only a select few type of foods.  PURPOSE  To increase stool bulk.  To make bowel movements more regular to prevent constipation.  To lower cholesterol.  To prevent overeating. WHEN IS THIS DIET USED?  It may be used if you have constipation and hemorrhoids.  It may be used if you have uncomplicated diverticulosis (intestine condition) and irritable bowel syndrome.  It may be used if you need help with weight management.  It may be used if you want to add it to your diet as a protective measure against atherosclerosis, diabetes, and cancer. SOURCES OF FIBER  Whole-grain breads and cereals.  Fruits, such as apples, oranges, bananas, berries, prunes, and pears.  Vegetables, such as green peas, carrots, sweet potatoes, beets, broccoli, cabbage, spinach, and artichokes.  Legumes, such split peas, soy,  lentils.  Almonds. FIBER CONTENT IN FOODS Starches and Grains / Dietary Fiber (g)  Cheerios, 1 cup / 3 g  Corn Flakes cereal, 1 cup / 0.7 g  Rice crispy treat cereal, 1 cup / 0.3 g  Instant oatmeal (cooked),  cup / 2 g  Frosted wheat cereal, 1 cup / 5.1 g  Brown, long-grain rice (cooked), 1 cup / 3.5 g  White, long-grain rice (cooked), 1 cup / 0.6 g  Enriched macaroni (cooked), 1 cup / 2.5 g Legumes / Dietary Fiber (g)  Baked beans (canned, plain, or vegetarian),  cup / 5.2 g  Kidney beans (canned),  cup / 6.8 g  Pinto beans (cooked),  cup / 5.5 g Breads and Crackers / Dietary Fiber (g)  Plain or honey graham crackers, 2 squares / 0.7 g  Saltine crackers, 3 squares / 0.3 g  Plain, salted pretzels, 10 pieces / 1.8 g  Whole-wheat bread, 1 slice / 1.9 g  White bread, 1 slice / 0.7 g  Raisin bread, 1 slice / 1.2 g  Plain bagel, 3 oz / 2 g  Flour tortilla, 1 oz / 0.9 g  Corn tortilla, 1 small / 1.5 g  Hamburger or hotdog bun, 1 small / 0.9 g Fruits / Dietary Fiber (g)  Apple with skin, 1 medium / 4.4 g  Sweetened applesauce,  cup / 1.5 g  Banana,  medium / 1.5 g  Grapes, 10 grapes / 0.4 g  Orange, 1 small / 2.3 g  Raisin, 1.5 oz / 1.6 g  Melon, 1 cup / 1.4 g Vegetables / Dietary Fiber (g)  Green beans (canned),  cup / 1.3 g  Carrots (cooked),  cup / 2.3 g  Broccoli (cooked),  cup / 2.8 g  Peas (cooked),  cup / 4.4 g  Mashed potatoes,  cup / 1.6 g  Lettuce, 1 cup / 0.5 g  Corn (canned),  cup / 1.6 g  Tomato,  cup / 1.1 g Document Released: 08/09/2005 Document Revised: 02/08/2012 Document Reviewed: 11/11/2011 [employer] Patient Information 2015 [place], [place]. This information is not intended to replace advice given to you by your health care provider. Make sure you discuss any questions you have with your health care provider. Diverticulosis Diverticulosis is the condition that develops when small pouches (diverticula) form in  the wall of your colon. Your colon, or large intestine, is where water is absorbed and stool is formed. The pouches form when the inside layer of your colon pushes through weak spots in the outer layers of your colon. CAUSES  No one knows exactly what causes diverticulosis. RISK FACTORS  Being older than 24. Your risk for this condition increases with age. Diverticulosis is rare in people younger than 40 years. By age 50, almost everyone has it.  Eating a low-fiber diet.  Being frequently constipated.  Being overweight.  Not getting enough exercise.  Smoking.  Taking over-the-counter pain medicines, like aspirin and ibuprofen. SYMPTOMS  Most people with diverticulosis do not have symptoms. DIAGNOSIS  Because diverticulosis often has no symptoms, health care providers often discover the condition during an exam for other colon  problems. In many cases, a health care provider will diagnose diverticulosis while using a flexible scope to examine the colon (colonoscopy). TREATMENT  If you have never developed an infection related to diverticulosis, you may not need treatment. If you have had an infection before, treatment may include:  Eating more fruits, vegetables, and grains.  Taking a fiber supplement.  Taking a live bacteria supplement (probiotic).  Taking medicine to relax your colon. HOME CARE INSTRUCTIONS   Drink at least 6-8 glasses of water each day to prevent constipation.  Try not to strain when you have a bowel movement.  Keep all follow-up appointments. If you have had an infection before:  Increase the fiber in your diet as directed by your health care provider or dietitian.  Take a dietary fiber supplement if your health care provider approves.  Only take medicines as directed by your health care provider. SEEK MEDICAL CARE IF:   You have abdominal pain.  You have bloating.  You have cramps.  You have not gone to the bathroom in 3 days. SEEK IMMEDIATE  MEDICAL CARE IF:   Your pain gets worse.  Yourbloating becomes very bad.  You have a fever or chills, and your symptoms suddenly get worse.  You begin vomiting.  You have bowel movements that are bloody or black. MAKE SURE YOU:  Understand these instructions.  Will watch your condition.  Will get help right away if you are not doing well or get worse. Document Released: 05/06/2004 Document Revised: 08/14/2013 Document Reviewed: 07/04/2013 [facility] Patient Information 2015 [place], [place]. This information is not intended to replace advice given to you by your health care provider. Make sure you discuss any questions you have with your health care provider. Colon Polyps Polyps are lumps of extra tissue growing inside the body. Polyps can grow in the large intestine (colon). Most colon polyps are noncancerous (benign). However, some colon polyps can become cancerous over time. Polyps that are larger than a pea may be harmful. To be safe, caregivers remove and test all polyps. CAUSES  Polyps form when mutations in the genes cause your cells to grow and divide even though no more tissue is needed. RISK FACTORS There are a number of risk factors that can increase your chances of getting colon polyps. They include:  Being older than 50 years.  Family history of colon polyps or colon cancer.  Long-term colon diseases, such as colitis or Crohn disease.  Being overweight.  Smoking.  Being inactive.  Drinking too much alcohol. SYMPTOMS  Most small polyps do not cause symptoms. If symptoms are present, they may include:  Blood in the stool. The stool may look dark red or black.  Constipation or diarrhea that lasts longer than 1 week. DIAGNOSIS People often do not know they have polyps until their caregiver finds them during a regular checkup. Your caregiver can use 4 tests to check for polyps:  Digital rectal exam. The caregiver wears gloves and feels inside the rectum. This  test would find polyps only in the rectum.  Barium enema. The caregiver puts a liquid called barium into your rectum before taking X-rays of your colon. Barium makes your colon look white. Polyps are dark, so they are easy to see in the X-ray pictures.  Sigmoidoscopy. A thin, flexible tube (sigmoidoscope) is placed into your rectum. The sigmoidoscope has a light and tiny camera in it. The caregiver uses the sigmoidoscope to look at the last third of your colon.  Colonoscopy. This test  is like sigmoidoscopy, but the caregiver looks at the entire colon. This is the most common method for finding and removing polyps. TREATMENT  Any polyps will be removed during a sigmoidoscopy or colonoscopy. The polyps are then tested for cancer. PREVENTION  To help lower your risk of getting more colon polyps:  Eat plenty of fruits and vegetables. Avoid eating fatty foods.  Do not smoke.  Avoid drinking alcohol.  Exercise every day.  Lose weight if recommended by your caregiver.  Eat plenty of calcium and folate. Foods that are rich in calcium include milk, cheese, and broccoli. Foods that are rich in folate include chickpeas, kidney beans, and spinach. HOME CARE INSTRUCTIONS Keep all follow-up appointments as directed by your caregiver. You may need periodic exams to check for polyps. SEEK MEDICAL CARE IF: You notice bleeding during a bowel movement. Document Released: 05/05/2004 Document Revised: 11/01/2011 Document Reviewed: 10/19/2011 [facility] Patient Information 2015 [place], [place]. This information is not intended to replace advice given to you by your health care provider. Make sure you discuss any questions you have with your health care provider.

## 2014-02-08 ENCOUNTER — Encounter (HOSPITAL_COMMUNITY): Payer: Self-pay | Admitting: Internal Medicine

## 2014-02-11 ENCOUNTER — Encounter (INDEPENDENT_AMBULATORY_CARE_PROVIDER_SITE_OTHER): Payer: Self-pay | Admitting: *Deleted

## 2014-02-11 ENCOUNTER — Other Ambulatory Visit: Payer: Self-pay | Admitting: Nurse Practitioner

## 2014-03-01 ENCOUNTER — Encounter: Payer: Self-pay | Admitting: Nurse Practitioner

## 2014-03-01 ENCOUNTER — Ambulatory Visit (INDEPENDENT_AMBULATORY_CARE_PROVIDER_SITE_OTHER): Payer: 59 | Admitting: Nurse Practitioner

## 2014-03-01 VITALS — BP 138/66 | HR 70 | Temp 98.1°F | Ht 73.0 in | Wt 236.0 lb

## 2014-03-01 DIAGNOSIS — I10 Essential (primary) hypertension: Secondary | ICD-10-CM

## 2014-03-01 DIAGNOSIS — Z6831 Body mass index (BMI) 31.0-31.9, adult: Secondary | ICD-10-CM

## 2014-03-01 DIAGNOSIS — Z713 Dietary counseling and surveillance: Secondary | ICD-10-CM

## 2014-03-01 DIAGNOSIS — E119 Type 2 diabetes mellitus without complications: Secondary | ICD-10-CM

## 2014-03-01 DIAGNOSIS — E785 Hyperlipidemia, unspecified: Secondary | ICD-10-CM

## 2014-03-01 DIAGNOSIS — N400 Enlarged prostate without lower urinary tract symptoms: Secondary | ICD-10-CM

## 2014-03-01 LAB — POCT GLYCOSYLATED HEMOGLOBIN (HGB A1C): Hemoglobin A1C: 6.8

## 2014-03-01 NOTE — Progress Notes (Signed)
Subjective:    Patient ID: Jonathon Adkins, male    DOB: 1951-08-07, 63 y.o.   MRN: [id number]  Patient here today for follow up- no changes since last visit.  Hypertension This is a chronic problem. The current episode started more than 1 year ago. The problem has been waxing and waning since onset. The problem is uncontrolled. Pertinent negatives include no blurred vision, chest pain, headaches, orthopnea, palpitations, peripheral edema or shortness of breath. There are no associated agents to hypertension. Risk factors for coronary artery disease include dyslipidemia, diabetes mellitus, male gender, obesity and family history. Past treatments include diuretics and calcium channel blockers. The current treatment provides moderate improvement. There are no compliance problems.  There is no history of a thyroid problem. There is no history of sleep apnea.  Hyperlipidemia This is a chronic problem. The current episode started more than 1 year ago. The problem is controlled. Recent lipid tests were reviewed and are normal. Exacerbating diseases include diabetes. He has no history of hypothyroidism. Pertinent negatives include no chest pain or shortness of breath. Current antihyperlipidemic treatment includes statins. The current treatment provides significant improvement of lipids. There are no compliance problems.  Risk factors for coronary artery disease include diabetes mellitus, hypertension, male sex, family history and dyslipidemia.  Diabetes He presents for his follow-up diabetic visit. He has type 2 diabetes mellitus. No MedicAlert identification noted. The initial diagnosis of diabetes was made 2 years ago. His disease course has been stable. There are no hypoglycemic associated symptoms. Pertinent negatives for hypoglycemia include no headaches or tremors. Pertinent negatives for diabetes include no blurred vision, no chest pain, no fatigue, no foot paresthesias, no foot ulcerations, no polydipsia,  no polyphagia, no polyuria, no visual change and no weight loss. There are no hypoglycemic complications. Symptoms are stable. There are no diabetic complications. Risk factors for coronary artery disease include dyslipidemia, hypertension, male sex and family history. Current diabetic treatment includes oral agent (monotherapy) and diet. He is compliant with treatment all of the time. His weight is stable. He is following a generally healthy diet. When asked about meal planning, he reported none. He has not had a previous visit with a dietician. He participates in exercise daily. There is no change in his home blood glucose trend. His breakfast blood glucose is taken between 8-9 am. His breakfast blood glucose range is generally 90-110 mg/dl. (Patient doesn't check blood sugars at home.) An ACE inhibitor/angiotensin II receptor blocker is not being taken. He does not see a podiatrist.Eye exam is current (This year ( can't remember date)).  BPH Dr. Risa Grill- every 6 months- Last PSA was less than 0.4.    Review of Systems  Constitutional: Negative for weight loss and fatigue.  Eyes: Negative for blurred vision.  Respiratory: Negative for shortness of breath.   Cardiovascular: Negative for chest pain, palpitations and orthopnea.  Endocrine: Negative for polydipsia, polyphagia and polyuria.  Neurological: Negative for tremors and headaches.  All other systems reviewed and are negative.      Objective:   Physical Exam  Constitutional: He is oriented to person, place, and time. He appears well-developed and well-nourished.  HENT:  Head: Normocephalic.  Right Ear: External ear normal.  Left Ear: External ear normal.  Nose: Nose normal.  Mouth/Throat: Oropharynx is clear and moist.  Eyes: EOM are normal. Pupils are equal, round, and reactive to light.  Neck: Normal range of motion. Neck supple. No thyromegaly present.  Cardiovascular: Normal rate, regular rhythm, normal heart  sounds and intact  distal pulses.   No murmur heard. Pulmonary/Chest: Effort normal and breath sounds normal. He has no wheezes. He has no rales.  Abdominal: Soft. Bowel sounds are normal.  Genitourinary:  DRE deferred to Dr. Risa Grill  Musculoskeletal: Normal range of motion.  Neurological: He is alert and oriented to person, place, and time.  Skin: Skin is warm and dry.  Psychiatric: He has a normal mood and affect. His behavior is normal. Judgment and thought content normal.   BP 138/66  Pulse 70  Temp(Src) 98.1 F (36.7 C) (Oral)  Ht $R'6\' 1"'Aw$  (1.854 m)  Wt 236 lb (107.049 kg)  BMI 31.14 kg/m2 Results for orders placed in visit on 03/01/14  POCT GLYCOSYLATED HEMOGLOBIN (HGB A1C)      Result Value Ref Range   Hemoglobin A1C 6.8            Assessment & Plan:   1. Hyperlipidemia   2. Essential hypertension   3. Type II or unspecified type diabetes mellitus without mention of complication, not stated as uncontrolled   4. BPH (benign prostatic hyperplasia)    Orders Placed This Encounter  Procedures  . CMP14+EGFR  . NMR, lipoprofile  . POCT glycosylated hemoglobin (Hb A1C)    Labs pending Health maintenance reviewed Diet and exercise encouraged Continue all meds Follow up  In 3 months    [place], FNP

## 2014-03-01 NOTE — Patient Instructions (Signed)

## 2014-03-02 LAB — CMP14+EGFR
A/G RATIO: 1.5 (ref 1.1–2.5)
ALBUMIN: 4.3 g/dL (ref 3.6–4.8)
ALT: 18 IU/L (ref 0–44)
AST: 16 IU/L (ref 0–40)
Alkaline Phosphatase: 69 IU/L (ref 39–117)
BILIRUBIN TOTAL: 1.6 mg/dL — AB (ref 0.0–1.2)
BUN / CREAT RATIO: 11 (ref 10–22)
BUN: 13 mg/dL (ref 8–27)
CO2: 24 mmol/L (ref 18–29)
Calcium: 10 mg/dL (ref 8.6–10.2)
Chloride: 99 mmol/L (ref 97–108)
Creatinine, Ser: 1.18 mg/dL (ref 0.76–1.27)
GFR calc non Af Amer: 65 mL/min/{1.73_m2} (ref >59)
GFR, EST AFRICAN AMERICAN: 75 mL/min/{1.73_m2} (ref >59)
Globulin, Total: 2.8 g/dL (ref 1.5–4.5)
Glucose: 153 mg/dL — ABNORMAL HIGH (ref 65–99)
Potassium: 4.2 mmol/L (ref 3.5–5.2)
Sodium: 137 mmol/L (ref 134–144)
Total Protein: 7.1 g/dL (ref 6.0–8.5)

## 2014-03-02 LAB — NMR, LIPOPROFILE
CHOLESTEROL: 153 mg/dL (ref 100–199)
HDL Cholesterol by NMR: 51 mg/dL (ref >39)
HDL Particle Number: 35.3 umol/L (ref >=30.5)
LDL PARTICLE NUMBER: 741 nmol/L (ref <1000)
LDL SIZE: 20.3 nm (ref >20.5)
LDLC SERPL CALC-MCNC: 83 mg/dL (ref 0–99)
LP-IR Score: 49 — ABNORMAL HIGH (ref <=45)
SMALL LDL PARTICLE NUMBER: 399 nmol/L (ref <=527)
Triglycerides by NMR: 97 mg/dL (ref 0–149)

## 2014-05-12 ENCOUNTER — Other Ambulatory Visit: Payer: Self-pay | Admitting: Nurse Practitioner

## 2014-06-12 ENCOUNTER — Ambulatory Visit (INDEPENDENT_AMBULATORY_CARE_PROVIDER_SITE_OTHER): Payer: 59 | Admitting: Nurse Practitioner

## 2014-06-12 ENCOUNTER — Encounter: Payer: Self-pay | Admitting: Nurse Practitioner

## 2014-06-12 VITALS — BP 147/81 | HR 79 | Temp 97.5°F | Ht 73.0 in | Wt 236.8 lb

## 2014-06-12 DIAGNOSIS — N400 Enlarged prostate without lower urinary tract symptoms: Secondary | ICD-10-CM

## 2014-06-12 DIAGNOSIS — E1310 Other specified diabetes mellitus with ketoacidosis without coma: Secondary | ICD-10-CM

## 2014-06-12 DIAGNOSIS — E119 Type 2 diabetes mellitus without complications: Secondary | ICD-10-CM

## 2014-06-12 DIAGNOSIS — E785 Hyperlipidemia, unspecified: Secondary | ICD-10-CM

## 2014-06-12 DIAGNOSIS — I10 Essential (primary) hypertension: Secondary | ICD-10-CM

## 2014-06-12 DIAGNOSIS — E1110 Type 2 diabetes mellitus with ketoacidosis without coma: Secondary | ICD-10-CM

## 2014-06-12 LAB — POCT GLYCOSYLATED HEMOGLOBIN (HGB A1C): Hemoglobin A1C: 6.5

## 2014-06-12 LAB — POCT UA - MICROALBUMIN: MICROALBUMIN (UR) POC: NEGATIVE mg/L

## 2014-06-12 MED ORDER — LOSARTAN POTASSIUM 50 MG PO TABS
50.0000 mg | ORAL_TABLET | Freq: Every day | ORAL | Status: DC
Start: 2014-06-12 — End: 2014-12-23

## 2014-06-12 MED ORDER — METFORMIN HCL 500 MG PO TABS: 500.0000 mg | ORAL_TABLET | Freq: Two times a day (BID) | ORAL | Status: DC

## 2014-06-12 MED ORDER — AMLODIPINE BESYLATE 10 MG PO TABS: 10.0000 mg | ORAL_TABLET | Freq: Every day | ORAL | Status: DC

## 2014-06-12 MED ORDER — HYDROCHLOROTHIAZIDE 25 MG PO TABS: 25.0000 mg | ORAL_TABLET | Freq: Every day | ORAL | Status: DC

## 2014-06-12 MED ORDER — FINASTERIDE 5 MG PO TABS: ORAL_TABLET | ORAL | Status: DC

## 2014-06-12 MED ORDER — ATORVASTATIN CALCIUM 40 MG PO TABS: 40.0000 mg | ORAL_TABLET | Freq: Every day | ORAL | Status: DC

## 2014-06-12 NOTE — Progress Notes (Signed)
Subjective:    Patient ID: Jonathon Adkins, male    DOB: Sep 05, 1950, 63 y.o.   MRN: [id number]  Patient here today for follow up- no changes since last visit.  Hypertension This is a chronic problem. The current episode started more than 1 year ago. The problem has been waxing and waning since onset. The problem is uncontrolled. Pertinent negatives include no blurred vision, chest pain, headaches, orthopnea, palpitations, peripheral edema or shortness of breath. There are no associated agents to hypertension. Risk factors for coronary artery disease include dyslipidemia, diabetes mellitus, male gender, obesity and family history. Past treatments include diuretics and calcium channel blockers. The current treatment provides moderate improvement. There are no compliance problems.  There is no history of a thyroid problem. There is no history of sleep apnea.  Hyperlipidemia This is a chronic problem. The current episode started more than 1 year ago. The problem is controlled. Recent lipid tests were reviewed and are normal. Exacerbating diseases include diabetes. He has no history of hypothyroidism. Pertinent negatives include no chest pain or shortness of breath. Current antihyperlipidemic treatment includes statins. The current treatment provides significant improvement of lipids. There are no compliance problems.  Risk factors for coronary artery disease include diabetes mellitus, hypertension, male sex, family history and dyslipidemia.  Diabetes He presents for his follow-up diabetic visit. He has type 2 diabetes mellitus. No MedicAlert identification noted. The initial diagnosis of diabetes was made 2 years ago. His disease course has been stable. There are no hypoglycemic associated symptoms. Pertinent negatives for hypoglycemia include no headaches or tremors. Pertinent negatives for diabetes include no blurred vision, no chest pain, no fatigue, no foot paresthesias, no foot ulcerations, no polydipsia,  no polyphagia, no polyuria, no visual change and no weight loss. There are no hypoglycemic complications. Symptoms are stable. There are no diabetic complications. Risk factors for coronary artery disease include dyslipidemia, hypertension, male sex and family history. Current diabetic treatment includes oral agent (monotherapy) and diet. He is compliant with treatment all of the time. His weight is stable. He is following a generally healthy diet. When asked about meal planning, he reported none. He has not had a previous visit with a dietician. He participates in exercise daily. There is no change in his home blood glucose trend. His breakfast blood glucose is taken between 8-9 am. His breakfast blood glucose range is generally 90-110 mg/dl. (Patient doesn't check blood sugars at home.) An ACE inhibitor/angiotensin II receptor blocker is not being taken. He does not see a podiatrist.Eye exam is current (This year ( can't remember date)).  BPH Dr. Risa Grill- every 6 months- Last PSA was less than 0.4.    Review of Systems  Constitutional: Negative for weight loss and fatigue.  Eyes: Negative for blurred vision.  Respiratory: Negative for shortness of breath.   Cardiovascular: Negative for chest pain, palpitations and orthopnea.  Endocrine: Negative for polydipsia, polyphagia and polyuria.  Neurological: Negative for tremors and headaches.  All other systems reviewed and are negative.      Objective:   Physical Exam  Constitutional: He is oriented to person, place, and time. He appears well-developed and well-nourished.  HENT:  Head: Normocephalic.  Right Ear: External ear normal.  Left Ear: External ear normal.  Nose: Nose normal.  Mouth/Throat: Oropharynx is clear and moist.  Eyes: EOM are normal. Pupils are equal, round, and reactive to light.  Neck: Normal range of motion. Neck supple. No thyromegaly present.  Cardiovascular: Normal rate, regular rhythm, normal heart  sounds and intact  distal pulses.   No murmur heard. Pulmonary/Chest: Effort normal and breath sounds normal. He has no wheezes. He has no rales.  Abdominal: Soft. Bowel sounds are normal.  Genitourinary:  DRE deferred to Dr. Risa Grill  Musculoskeletal: Normal range of motion.  Neurological: He is alert and oriented to person, place, and time.  Skin: Skin is warm and dry.  Psychiatric: He has a normal mood and affect. His behavior is normal. Judgment and thought content normal.   BP 147/81  Pulse 79  Temp(Src) 97.5 F (36.4 C) (Oral)  Ht _0  (1.854 m)  Wt 236 lb 12.8 oz (107.412 kg)  BMI 31.25 kg/m2 Results for orders placed in visit on 06/12/14  POCT GLYCOSYLATED HEMOGLOBIN (HGB A1C)      Result Value Ref Range   Hemoglobin A1C 6.5            Assessment & Plan:   1. Hyperlipidemia with target LDL less than 100 - atorvastatin (LIPITOR) 40 MG tablet; Take 1 tablet (40 mg total) by mouth daily.  Dispense: 90 tablet; Refill: 1 Low fat diet - NMR, lipoprofile  2. Essential hypertension Avoid NA+ in diet - CMP14+EGFR - losartan (COZAAR) 50 MG tablet; Take 1 tablet (50 mg total) by mouth daily.  Dispense: 90 tablet; Refill: 1 - hydrochlorothiazide (HYDRODIURIL) 25 MG tablet; Take 1 tablet (25 mg total) by mouth daily.  Dispense: 90 tablet; Refill: 1 - amLODipine (NORVASC) 10 MG tablet; Take 1 tablet (10 mg total) by mouth daily.  Dispense: 90 tablet; Refill: 1  3. BPH (benign prostatic hyperplasia) - finasteride (PROSCAR) 5 MG tablet; TAKE ONE TABLET BY MOUTH ONCE DAILY  Dispense: 90 tablet; Refill: 1  4. Type 2 diabetes mellitus without complication Low carb diet - POCT glycosylated hemoglobin (Hb A1C) - POCT UA - Microalbumin  - metFORMIN (GLUCOPHAGE) 500 MG tablet; Take 1 tablet (500 mg total) by mouth 2 (two) times daily with a meal.  Dispense: 180 tablet; Refill: 1    Labs pending Health maintenance reviewed Diet and exercise encouraged Continue all meds Follow up  In 3 months    [place], FNP

## 2014-06-12 NOTE — Patient Instructions (Signed)
What are Advance Directives? A living will allows you to document your wishes concerning medical treatments at the end of life.   Before your living will can guide medical decision-making two physicians must certify: You are unable to make medical decisions,  You are in the medical condition specified in the state's living will law (such as "terminal illness" or "permanent unconsciousness"),  Other requirements also may apply, depending upon the state. A medical power of attorney (or healthcare proxy) allows you to appoint a person you trust as your healthcare agent (or surrogate decision maker), who is authorized to make medical decisions on your behalf.   Before a medical power of attorney goes into effect a person's physician must conclude that they are unable to make their own medical decisions. In addition: If a person regains the ability to make decisions, the agent cannot continue to act on the person's behalf.  Many states have additional requirements that apply only to decisions about life-sustaining medical treatments.  For example, before your agent can refuse a life-sustaining treatment on your behalf, a second physician may have to confirm your doctor's assessment that you are incapable of making treatment decisions. What Else Do I Need to Know?  Advance directives are legally valid throughout the [place]. While you do not need a lawyer to fill out an advance directive, your advance directive becomes legally valid as soon as you sign them in front of the required witnesses. The laws governing advance directives vary from state to state, so it is important to complete and sign advance directives that comply with your state's law. Also, advance directives can have different titles in different states.  Emergency medical technicians cannot honor living wills or medical powers of attorney. Once emergency personnel have been called, they must do what is necessary to stabilize a person  for transfer to a hospital, both from accident sites and from a home or other facility. After a physician fully evaluates the person's condition and determines the underlying conditions, advance directives can be implemented.  One state's advance directive does not always work in another state. Some states do honor advance directives from another state; others will honor out-of-state advance directives as long as they are similar to the state's own law; and some states do not have an answer to this question. The best solution is if you spend a significant amount of time in more than one state, you should complete the advance directives for all the states you spend a significant amount of time in.  Advance directives do not expire. An advance directive remains in effect until you change it. If you complete a new advance directive, it invalidates the previous one.  You should review your advance directives periodically to ensure that they still reflect your wishes. If you want to change anything in an advance directive once you have completed it, you should complete a whole new document. Search         [facility], [email or web address]

## 2014-06-13 ENCOUNTER — Telehealth: Payer: Self-pay | Admitting: *Deleted

## 2014-06-13 LAB — CMP14+EGFR
ALK PHOS: 70 IU/L (ref 39–117)
ALT: 19 IU/L (ref 0–44)
AST: 17 IU/L (ref 0–40)
Albumin/Globulin Ratio: 1.4 (ref 1.1–2.5)
Albumin: 4.2 g/dL (ref 3.6–4.8)
BILIRUBIN TOTAL: 1.8 mg/dL — AB (ref 0.0–1.2)
BUN/Creatinine Ratio: 12 (ref 10–22)
BUN: 13 mg/dL (ref 8–27)
CHLORIDE: 97 mmol/L (ref 97–108)
CO2: 26 mmol/L (ref 18–29)
Calcium: 10 mg/dL (ref 8.6–10.2)
Creatinine, Ser: 1.11 mg/dL (ref 0.76–1.27)
GFR calc non Af Amer: 69 mL/min/{1.73_m2} (ref >59)
GFR, EST AFRICAN AMERICAN: 80 mL/min/{1.73_m2} (ref >59)
Globulin, Total: 3.1 g/dL (ref 1.5–4.5)
Glucose: 141 mg/dL — ABNORMAL HIGH (ref 65–99)
POTASSIUM: 4 mmol/L (ref 3.5–5.2)
Sodium: 137 mmol/L (ref 134–144)
Total Protein: 7.3 g/dL (ref 6.0–8.5)

## 2014-06-13 LAB — NMR, LIPOPROFILE
Cholesterol: 138 mg/dL (ref 100–199)
HDL Cholesterol by NMR: 52 mg/dL (ref >39)
HDL Particle Number: 36.4 umol/L (ref >=30.5)
LDL PARTICLE NUMBER: 919 nmol/L (ref <1000)
LDL SIZE: 20.6 nm (ref >20.5)
LDLC SERPL CALC-MCNC: 72 mg/dL (ref 0–99)
LP-IR Score: 47 — ABNORMAL HIGH (ref <=45)
Small LDL Particle Number: 417 nmol/L (ref <=527)
Triglycerides by NMR: 71 mg/dL (ref 0–149)

## 2014-06-13 NOTE — Telephone Encounter (Signed)
Message copied by Shelbie Ammons on Thu Jun 13, 2014 10:07 AM ------      Message from: Chevis Pretty      Created: Thu Jun 13, 2014  8:53 AM       mircoalbumin negative      Hgba1c discussed at appointment      Kidney and liver function stable      Cholesterol looks good      Continue current meds- low fat diet and exercise and recheck in 3 months       ------

## 2014-06-13 NOTE — Telephone Encounter (Signed)
Labs are good.

## 2014-09-18 ENCOUNTER — Encounter (INDEPENDENT_AMBULATORY_CARE_PROVIDER_SITE_OTHER): Payer: Self-pay

## 2014-09-18 ENCOUNTER — Ambulatory Visit (INDEPENDENT_AMBULATORY_CARE_PROVIDER_SITE_OTHER): Payer: 59 | Admitting: Nurse Practitioner

## 2014-09-18 ENCOUNTER — Encounter: Payer: Self-pay | Admitting: Nurse Practitioner

## 2014-09-18 VITALS — BP 143/83 | HR 88 | Temp 97.9°F | Ht 73.0 in | Wt 239.0 lb

## 2014-09-18 DIAGNOSIS — E785 Hyperlipidemia, unspecified: Secondary | ICD-10-CM

## 2014-09-18 DIAGNOSIS — I10 Essential (primary) hypertension: Secondary | ICD-10-CM | POA: Diagnosis not present

## 2014-09-18 DIAGNOSIS — E1110 Type 2 diabetes mellitus with ketoacidosis without coma: Secondary | ICD-10-CM

## 2014-09-18 DIAGNOSIS — N400 Enlarged prostate without lower urinary tract symptoms: Secondary | ICD-10-CM

## 2014-09-18 DIAGNOSIS — E1310 Other specified diabetes mellitus with ketoacidosis without coma: Secondary | ICD-10-CM | POA: Diagnosis not present

## 2014-09-18 LAB — POCT GLYCOSYLATED HEMOGLOBIN (HGB A1C): HEMOGLOBIN A1C: 6.9

## 2014-09-18 NOTE — Patient Instructions (Signed)

## 2014-09-18 NOTE — Progress Notes (Signed)
Subjective:    Patient ID: Jonathon Adkins, male    DOB: 05-13-1951, 64 y.o.   MRN: [id number]  Patient here today for follow up- no changes since last visit.  Hypertension This is a chronic problem. The current episode started more than 1 year ago. The problem is unchanged. The problem is controlled. Pertinent negatives include no chest pain, headaches, palpitations or shortness of breath. Risk factors for coronary artery disease include dyslipidemia, diabetes mellitus and obesity. Past treatments include angiotensin blockers, calcium channel blockers and diuretics. The current treatment provides moderate improvement. Compliance problems include diet and exercise.   Hyperlipidemia This is a chronic problem. The current episode started more than 1 year ago. The problem is controlled. Recent lipid tests were reviewed and are normal. Exacerbating diseases include diabetes and obesity. He has no history of hypothyroidism. Pertinent negatives include no chest pain or shortness of breath. Current antihyperlipidemic treatment includes statins. The current treatment provides moderate improvement of lipids. Compliance problems include adherence to diet and adherence to exercise.  Risk factors for coronary artery disease include diabetes mellitus, dyslipidemia, hypertension and male sex.  Diabetes He presents for his follow-up diabetic visit. He has type 2 diabetes mellitus. No MedicAlert identification noted. His disease course has been stable. Pertinent negatives for hypoglycemia include no headaches or tremors. Pertinent negatives for diabetes include no chest pain, no fatigue, no polydipsia, no polyphagia, no polyuria and no visual change. There are no hypoglycemic complications. Symptoms are stable. There are no diabetic complications. Risk factors for coronary artery disease include diabetes mellitus, dyslipidemia, hypertension, male sex and obesity. Current diabetic treatment includes oral agent (monotherapy).  He is compliant with treatment most of the time. His weight is stable. When asked about meal planning, he reported none. He has not had a previous visit with a dietitian. He rarely participates in exercise. His breakfast blood glucose is taken between 8-9 am. His breakfast blood glucose range is generally 130-140 mg/dl. His overall blood glucose range is 130-140 mg/dl. An ACE inhibitor/angiotensin II receptor blocker is being taken. He does not see a podiatrist.Eye exam is not current.  BPH Dr. Risa Grill- every 6 months- Last PSA was less than 0.4.    Review of Systems  Constitutional: Negative for fatigue.  Respiratory: Negative for shortness of breath.   Cardiovascular: Negative for chest pain and palpitations.  Endocrine: Negative for polydipsia, polyphagia and polyuria.  Neurological: Negative for tremors and headaches.  All other systems reviewed and are negative.      Objective:   Physical Exam  Constitutional: He is oriented to person, place, and time. He appears well-developed and well-nourished.  HENT:  Head: Normocephalic.  Right Ear: External ear normal.  Left Ear: External ear normal.  Nose: Nose normal.  Mouth/Throat: Oropharynx is clear and moist.  Eyes: EOM are normal. Pupils are equal, round, and reactive to light.  Neck: Normal range of motion. Neck supple. No thyromegaly present.  Cardiovascular: Normal rate, regular rhythm, normal heart sounds and intact distal pulses.   No murmur heard. Pulmonary/Chest: Effort normal and breath sounds normal. He has no wheezes. He has no rales.  Abdominal: Soft. Bowel sounds are normal.  Genitourinary:  DRE deferred to Dr. Risa Grill  Musculoskeletal: Normal range of motion.  Neurological: He is alert and oriented to person, place, and time.  Skin: Skin is warm and dry.  Psychiatric: He has a normal mood and affect. His behavior is normal. Judgment and thought content normal.   BP 143/83 mmHg  Pulse 88  Temp(Src) 97.9 F (36.6 C)  (Oral)  Ht _0  (1.854 m)  Wt 239 lb (108.41 kg)  BMI 31.54 kg/m2  Results for orders placed or performed in visit on 09/18/14  POCT glycosylated hemoglobin (Hb A1C)  Result Value Ref Range   Hemoglobin A1C 6.9%           Assessment & Plan:    1. DM (diabetes mellitus) type 2, uncontrolled, with ketoacidosis Low carb diet - POCT glycosylated hemoglobin (Hb A1C)  2. Hyperlipidemia Low fat diet - NMR, lipoprofile  3. Essential hypertension Do not add salt to diet - CMP14+EGFR  4. BPH (benign prostatic hyperplasia) Keep follow up appointment with Dr. Risa Grill    Labs pending Health maintenance reviewed Diet and exercise encouraged Continue all meds Follow up  In 3 months   [place], FNP

## 2014-09-19 LAB — CMP14+EGFR
ALBUMIN: 4.3 g/dL (ref 3.6–4.8)
ALK PHOS: 76 IU/L (ref 39–117)
ALT: 19 IU/L (ref 0–44)
AST: 19 IU/L (ref 0–40)
Albumin/Globulin Ratio: 1.3 (ref 1.1–2.5)
BUN/Creatinine Ratio: 14 (ref 10–22)
BUN: 14 mg/dL (ref 8–27)
CO2: 25 mmol/L (ref 18–29)
Calcium: 10 mg/dL (ref 8.6–10.2)
Chloride: 100 mmol/L (ref 97–108)
Creatinine, Ser: 1.03 mg/dL (ref 0.76–1.27)
GFR calc Af Amer: 88 mL/min/{1.73_m2} (ref >59)
GFR calc non Af Amer: 76 mL/min/{1.73_m2} (ref >59)
Globulin, Total: 3.2 g/dL (ref 1.5–4.5)
Glucose: 162 mg/dL — ABNORMAL HIGH (ref 65–99)
Potassium: 4.1 mmol/L (ref 3.5–5.2)
SODIUM: 139 mmol/L (ref 134–144)
TOTAL PROTEIN: 7.5 g/dL (ref 6.0–8.5)
Total Bilirubin: 1.5 mg/dL — ABNORMAL HIGH (ref 0.0–1.2)

## 2014-09-19 LAB — NMR, LIPOPROFILE
CHOLESTEROL: 135 mg/dL (ref 100–199)
HDL Cholesterol by NMR: 53 mg/dL (ref >39)
HDL PARTICLE NUMBER: 36 umol/L (ref >=30.5)
LDL PARTICLE NUMBER: 906 nmol/L (ref <1000)
LDL Size: 20.5 nm (ref >20.5)
LDL-C: 68 mg/dL (ref 0–99)
LP-IR Score: 79 — ABNORMAL HIGH (ref <=45)
SMALL LDL PARTICLE NUMBER: 443 nmol/L (ref <=527)
TRIGLYCERIDES BY NMR: 72 mg/dL (ref 0–149)

## 2014-12-23 ENCOUNTER — Encounter: Payer: Self-pay | Admitting: Nurse Practitioner

## 2014-12-23 ENCOUNTER — Ambulatory Visit (INDEPENDENT_AMBULATORY_CARE_PROVIDER_SITE_OTHER): Payer: BLUE CROSS/BLUE SHIELD | Admitting: Nurse Practitioner

## 2014-12-23 VITALS — BP 128/87 | HR 90 | Temp 97.0°F | Ht 73.0 in | Wt 242.0 lb

## 2014-12-23 DIAGNOSIS — E1110 Type 2 diabetes mellitus with ketoacidosis without coma: Secondary | ICD-10-CM

## 2014-12-23 DIAGNOSIS — E119 Type 2 diabetes mellitus without complications: Secondary | ICD-10-CM | POA: Diagnosis not present

## 2014-12-23 DIAGNOSIS — I10 Essential (primary) hypertension: Secondary | ICD-10-CM

## 2014-12-23 DIAGNOSIS — E785 Hyperlipidemia, unspecified: Secondary | ICD-10-CM

## 2014-12-23 DIAGNOSIS — N400 Enlarged prostate without lower urinary tract symptoms: Secondary | ICD-10-CM | POA: Diagnosis not present

## 2014-12-23 DIAGNOSIS — E1310 Other specified diabetes mellitus with ketoacidosis without coma: Secondary | ICD-10-CM | POA: Diagnosis not present

## 2014-12-23 LAB — POCT GLYCOSYLATED HEMOGLOBIN (HGB A1C): HEMOGLOBIN A1C: 7.3

## 2014-12-23 MED ORDER — HYDROCHLOROTHIAZIDE 25 MG PO TABS: 25.0000 mg | ORAL_TABLET | Freq: Every day | ORAL | Status: DC

## 2014-12-23 MED ORDER — LOSARTAN POTASSIUM 50 MG PO TABS: 50.0000 mg | ORAL_TABLET | Freq: Every day | ORAL | Status: DC

## 2014-12-23 MED ORDER — FINASTERIDE 5 MG PO TABS: ORAL_TABLET | ORAL | Status: DC

## 2014-12-23 MED ORDER — ATORVASTATIN CALCIUM 40 MG PO TABS: 40.0000 mg | ORAL_TABLET | Freq: Every day | ORAL | Status: DC

## 2014-12-23 MED ORDER — METFORMIN HCL 500 MG PO TABS: 500.0000 mg | ORAL_TABLET | Freq: Two times a day (BID) | ORAL | Status: DC

## 2014-12-23 MED ORDER — AMLODIPINE BESYLATE 10 MG PO TABS: 10.0000 mg | ORAL_TABLET | Freq: Every day | ORAL | Status: DC

## 2014-12-23 NOTE — Patient Instructions (Signed)
Fat and Cholesterol Control Diet Fat and cholesterol levels in your blood and organs are influenced by your diet. High levels of fat and cholesterol may lead to diseases of the heart, small and large blood vessels, gallbladder, liver, and pancreas. CONTROLLING FAT AND CHOLESTEROL WITH DIET Although exercise and lifestyle factors are important, your diet is key. That is because certain foods are known to raise cholesterol and others to lower it. The goal is to balance foods for their effect on cholesterol and more importantly, to replace saturated and trans fat with other types of fat, such as monounsaturated fat, polyunsaturated fat, and omega-3 fatty acids. On average, a person should consume no more than 15 to 17 g of saturated fat daily. Saturated and trans fats are considered "bad" fats, and they will raise LDL cholesterol. Saturated fats are primarily found in animal products such as meats, butter, and cream. However, that does not mean you need to give up all your favorite foods. Today, there are good tasting, low-fat, low-cholesterol substitutes for most of the things you like to eat. Choose low-fat or nonfat alternatives. Choose round or loin cuts of red meat. These types of cuts are lowest in fat and cholesterol. Chicken (without the skin), fish, veal, and ground turkey breast are great choices. Eliminate fatty meats, such as hot dogs and salami. Even shellfish have little or no saturated fat. Have a 3 oz (85 g) portion when you eat lean meat, poultry, or fish. Trans fats are also called "partially hydrogenated oils." They are oils that have been scientifically manipulated so that they are solid at room temperature resulting in a longer shelf life and improved taste and texture of foods in which they are added. Trans fats are found in stick margarine, some tub margarines, cookies, crackers, and baked goods.  When baking and cooking, oils are a great substitute for butter. The monounsaturated oils are  especially beneficial since it is believed they lower LDL and raise HDL. The oils you should avoid entirely are saturated tropical oils, such as coconut and palm.  Remember to eat a lot from food groups that are naturally free of saturated and trans fat, including fish, fruit, vegetables, beans, grains (barley, rice, couscous, bulgur wheat), and pasta (without cream sauces).  IDENTIFYING FOODS THAT LOWER FAT AND CHOLESTEROL  Soluble fiber may lower your cholesterol. This type of fiber is found in fruits such as apples, vegetables such as broccoli, potatoes, and carrots, legumes such as beans, peas, and lentils, and grains such as barley. Foods fortified with plant sterols (phytosterol) may also lower cholesterol. You should eat at least 2 g per day of these foods for a cholesterol lowering effect.  Read package labels to identify low-saturated fats, trans fat free, and low-fat foods at the supermarket. Select cheeses that have only 2 to 3 g saturated fat per ounce. Use a heart-healthy tub margarine that is free of trans fats or partially hydrogenated oil. When buying baked goods (cookies, crackers), avoid partially hydrogenated oils. Breads and muffins should be made from whole grains (whole-wheat or whole oat flour, instead of "flour" or "enriched flour"). Buy non-creamy canned soups with reduced salt and no added fats.  FOOD PREPARATION TECHNIQUES  Never deep-fry. If you must fry, either stir-fry, which uses very little fat, or use non-stick cooking sprays. When possible, broil, bake, or roast meats, and steam vegetables. Instead of putting butter or margarine on vegetables, use lemon and herbs, applesauce, and cinnamon (for squash and sweet potatoes). Use nonfat   yogurt, salsa, and low-fat dressings for salads.  LOW-SATURATED FAT / LOW-FAT FOOD SUBSTITUTES Meats / Saturated Fat (g)  Avoid: Steak, marbled (3 oz/85 g) / 11 g  Choose: Steak, lean (3 oz/85 g) / 4 g  Avoid: Hamburger (3 oz/85 g) / 7  g  Choose: Hamburger, lean (3 oz/85 g) / 5 g  Avoid: Ham (3 oz/85 g) / 6 g  Choose: Ham, lean cut (3 oz/85 g) / 2.4 g  Avoid: Chicken, with skin, dark meat (3 oz/85 g) / 4 g  Choose: Chicken, skin removed, dark meat (3 oz/85 g) / 2 g  Avoid: Chicken, with skin, light meat (3 oz/85 g) / 2.5 g  Choose: Chicken, skin removed, light meat (3 oz/85 g) / 1 g Dairy / Saturated Fat (g)  Avoid: Whole milk (1 cup) / 5 g  Choose: Low-fat milk, 2% (1 cup) / 3 g  Choose: Low-fat milk, 1% (1 cup) / 1.5 g  Choose: Skim milk (1 cup) / 0.3 g  Avoid: Hard cheese (1 oz/28 g) / 6 g  Choose: Skim milk cheese (1 oz/28 g) / 2 to 3 g  Avoid: Cottage cheese, 4% fat (1 cup) / 6.5 g  Choose: Low-fat cottage cheese, 1% fat (1 cup) / 1.5 g  Avoid: Ice cream (1 cup) / 9 g  Choose: Sherbet (1 cup) / 2.5 g  Choose: Nonfat frozen yogurt (1 cup) / 0.3 g  Choose: Frozen fruit bar / trace  Avoid: Whipped cream (1 tbs) / 3.5 g  Choose: Nondairy whipped topping (1 tbs) / 1 g Condiments / Saturated Fat (g)  Avoid: Mayonnaise (1 tbs) / 2 g  Choose: Low-fat mayonnaise (1 tbs) / 1 g  Avoid: Butter (1 tbs) / 7 g  Choose: Extra light margarine (1 tbs) / 1 g  Avoid: Coconut oil (1 tbs) / 11.8 g  Choose: Olive oil (1 tbs) / 1.8 g  Choose: Corn oil (1 tbs) / 1.7 g  Choose: Safflower oil (1 tbs) / 1.2 g  Choose: Sunflower oil (1 tbs) / 1.4 g  Choose: Soybean oil (1 tbs) / 2.4 g  Choose: Canola oil (1 tbs) / 1 g Document Released: 08/09/2005 Document Revised: 12/04/2012 Document Reviewed: 11/07/2013 [employer] Patient Information 2015 [employer]. This information is not intended to replace advice given to you by your health care provider. Make sure you discuss any questions you have with your health care provider.  

## 2014-12-23 NOTE — Progress Notes (Signed)
Subjective:    Patient ID: Jonathon Adkins, male    DOB: 1951/02/17, 64 y.o.   MRN: [id number]  Patient here today for follow up- no changes since last visit.  Hypertension This is a chronic problem. The current episode started more than 1 year ago. The problem is unchanged. The problem is controlled. Pertinent negatives include no chest pain, headaches, palpitations or shortness of breath. Risk factors for coronary artery disease include dyslipidemia, diabetes mellitus and obesity. Past treatments include angiotensin blockers, calcium channel blockers and diuretics. The current treatment provides moderate improvement. Compliance problems include diet and exercise.   Hyperlipidemia This is a chronic problem. The current episode started more than 1 year ago. The problem is controlled. Recent lipid tests were reviewed and are normal. Exacerbating diseases include diabetes and obesity. He has no history of hypothyroidism. Pertinent negatives include no chest pain or shortness of breath. Current antihyperlipidemic treatment includes statins. The current treatment provides moderate improvement of lipids. Compliance problems include adherence to diet and adherence to exercise.  Risk factors for coronary artery disease include diabetes mellitus, dyslipidemia, hypertension and male sex.  Diabetes He presents for his follow-up diabetic visit. He has type 2 diabetes mellitus. No MedicAlert identification noted. His disease course has been stable. Pertinent negatives for hypoglycemia include no headaches or tremors. Pertinent negatives for diabetes include no chest pain, no fatigue, no polydipsia, no polyphagia, no polyuria and no visual change. There are no hypoglycemic complications. Symptoms are stable. There are no diabetic complications. Risk factors for coronary artery disease include diabetes mellitus, dyslipidemia, hypertension, male sex and obesity. Current diabetic treatment includes oral agent (monotherapy).  He is compliant with treatment most of the time. His weight is stable. When asked about meal planning, he reported none. He has not had a previous visit with a dietitian. He rarely participates in exercise. His breakfast blood glucose is taken between 8-9 am. His breakfast blood glucose range is generally 130-140 mg/dl. His overall blood glucose range is 130-140 mg/dl. An ACE inhibitor/angiotensin II receptor blocker is being taken. He does not see a podiatrist.Eye exam is not current.  BPH Dr. Risa Grill- every 6 months- Last PSA was less than 0.4.    Review of Systems  Constitutional: Negative.  Negative for fatigue.  HENT: Negative.   Respiratory: Negative for shortness of breath.   Cardiovascular: Negative for chest pain and palpitations.  Endocrine: Negative for polydipsia, polyphagia and polyuria.  Neurological: Negative.  Negative for tremors and headaches.  Psychiatric/Behavioral: Negative.   All other systems reviewed and are negative.      Objective:   Physical Exam  Constitutional: He is oriented to person, place, and time. He appears well-developed and well-nourished.  HENT:  Head: Normocephalic.  Right Ear: External ear normal.  Left Ear: External ear normal.  Nose: Nose normal.  Mouth/Throat: Oropharynx is clear and moist.  Eyes: EOM are normal. Pupils are equal, round, and reactive to light.  Neck: Normal range of motion. Neck supple. No thyromegaly present.  Cardiovascular: Normal rate, regular rhythm, normal heart sounds and intact distal pulses.   No murmur heard. Pulmonary/Chest: Effort normal and breath sounds normal. He has no wheezes. He has no rales.  Abdominal: Soft. Bowel sounds are normal.  Genitourinary:  DRE deferred to Dr. Risa Grill  Musculoskeletal: Normal range of motion.  Neurological: He is alert and oriented to person, place, and time.  Skin: Skin is warm and dry.  Psychiatric: He has a normal mood and affect. His behavior is  normal. Judgment and  thought content normal.     Results for orders placed or performed in visit on 12/23/14  POCT glycosylated hemoglobin (Hb A1C)  Result Value Ref Range   Hemoglobin A1C 7.3    BP 128/87 mmHg  Pulse 90  Temp(Src) 97 F (36.1 C) (Oral)  Ht $R'6\' 1"'Uj$  (1.854 m)  Wt 242 lb (109.77 kg)  BMI 31.93 kg/m2        Assessment & Plan:   1. DM (diabetes mellitus) type 2, controlled without complications Watch carbs in diet - POCT glycosylated hemoglobin (Hb A1C) - metFORMIN (GLUCOPHAGE) 500 MG tablet; Take 1 tablet (500 mg total) by mouth 2 (two) times daily with a meal.  Dispense: 180 tablet; Refill: 1  2. Hyperlipidemia Low fta diet - NMR, lipoprofile - atorvastatin (LIPITOR) 40 MG tablet; Take 1 tablet (40 mg total) by mouth daily.  Dispense: 90 tablet; Refill: 1   3. Essential hypertension Do not add salt to diet - CMP14+EGFR - losartan (COZAAR) 50 MG tablet; Take 1 tablet (50 mg total) by mouth daily.  Dispense: 90 tablet; Refill: 1 - hydrochlorothiazide (HYDRODIURIL) 25 MG tablet; Take 1 tablet (25 mg total) by mouth daily.  Dispense: 90 tablet; Refill: 1 - amLODipine (NORVASC) 10 MG tablet; Take 1 tablet (10 mg total) by mouth daily.  Dispense: 90 tablet; Refill: 1  4. BPH (benign prostatic hyperplasia) - finasteride (PROSCAR) 5 MG tablet; TAKE ONE TABLET BY MOUTH ONCE DAILY  Dispense: 90 tablet; Refill: 1     Labs pending Health maintenance reviewed Diet and exercise encouraged Continue all meds Follow up  In 3 month   [place], FNP

## 2014-12-24 LAB — CMP14+EGFR
A/G RATIO: 1.5 (ref 1.1–2.5)
ALBUMIN: 4.3 g/dL (ref 3.6–4.8)
ALT: 21 IU/L (ref 0–44)
AST: 16 IU/L (ref 0–40)
Alkaline Phosphatase: 71 IU/L (ref 39–117)
BUN/Creatinine Ratio: 11 (ref 10–22)
BUN: 12 mg/dL (ref 8–27)
Bilirubin Total: 1.2 mg/dL (ref 0.0–1.2)
CALCIUM: 9.8 mg/dL (ref 8.6–10.2)
CO2: 24 mmol/L (ref 18–29)
CREATININE: 1.12 mg/dL (ref 0.76–1.27)
Chloride: 99 mmol/L (ref 97–108)
GFR calc non Af Amer: 69 mL/min/{1.73_m2} (ref >59)
GFR, EST AFRICAN AMERICAN: 79 mL/min/{1.73_m2} (ref >59)
GLUCOSE: 148 mg/dL — AB (ref 65–99)
Globulin, Total: 2.9 g/dL (ref 1.5–4.5)
POTASSIUM: 3.8 mmol/L (ref 3.5–5.2)
Sodium: 140 mmol/L (ref 134–144)
TOTAL PROTEIN: 7.2 g/dL (ref 6.0–8.5)

## 2014-12-24 LAB — NMR, LIPOPROFILE
Cholesterol: 131 mg/dL (ref 100–199)
HDL Cholesterol by NMR: 52 mg/dL (ref >39)
HDL Particle Number: 34.9 umol/L (ref >=30.5)
LDL PARTICLE NUMBER: 887 nmol/L (ref <1000)
LDL Size: 20.6 nm (ref >20.5)
LDL-C: 63 mg/dL (ref 0–99)
LP-IR SCORE: 69 — AB (ref <=45)
Small LDL Particle Number: 404 nmol/L (ref <=527)
Triglycerides by NMR: 78 mg/dL (ref 0–149)

## 2015-03-28 ENCOUNTER — Encounter (INDEPENDENT_AMBULATORY_CARE_PROVIDER_SITE_OTHER): Payer: Self-pay

## 2015-03-28 ENCOUNTER — Ambulatory Visit (INDEPENDENT_AMBULATORY_CARE_PROVIDER_SITE_OTHER): Payer: BLUE CROSS/BLUE SHIELD | Admitting: Nurse Practitioner

## 2015-03-28 ENCOUNTER — Encounter: Payer: Self-pay | Admitting: Nurse Practitioner

## 2015-03-28 VITALS — BP 144/86 | HR 72 | Temp 96.8°F | Ht 73.0 in | Wt 243.0 lb

## 2015-03-28 DIAGNOSIS — E785 Hyperlipidemia, unspecified: Secondary | ICD-10-CM | POA: Diagnosis not present

## 2015-03-28 DIAGNOSIS — I10 Essential (primary) hypertension: Secondary | ICD-10-CM

## 2015-03-28 DIAGNOSIS — Z6832 Body mass index (BMI) 32.0-32.9, adult: Secondary | ICD-10-CM

## 2015-03-28 DIAGNOSIS — E1110 Type 2 diabetes mellitus with ketoacidosis without coma: Secondary | ICD-10-CM

## 2015-03-28 DIAGNOSIS — Z1212 Encounter for screening for malignant neoplasm of rectum: Secondary | ICD-10-CM

## 2015-03-28 DIAGNOSIS — N400 Enlarged prostate without lower urinary tract symptoms: Secondary | ICD-10-CM

## 2015-03-28 DIAGNOSIS — E1310 Other specified diabetes mellitus with ketoacidosis without coma: Secondary | ICD-10-CM | POA: Diagnosis not present

## 2015-03-28 LAB — POCT GLYCOSYLATED HEMOGLOBIN (HGB A1C): HEMOGLOBIN A1C: 7.2

## 2015-03-28 NOTE — Progress Notes (Signed)
Subjective:    Patient ID: Jonathon Adkins, male    DOB: [birth year], 64 y.o.   MRN: [id number]  Patient here today for follow up- no changes since last visit.  Hypertension This is a chronic problem. The current episode started more than 1 year ago. The problem is unchanged. The problem is controlled. Pertinent negatives include no chest pain, headaches, palpitations or shortness of breath. Risk factors for coronary artery disease include dyslipidemia, diabetes mellitus and obesity. Past treatments include angiotensin blockers, calcium channel blockers and diuretics. The current treatment provides moderate improvement. Compliance problems include diet and exercise.   Hyperlipidemia This is a chronic problem. The current episode started more than 1 year ago. The problem is controlled. Recent lipid tests were reviewed and are normal. Exacerbating diseases include diabetes and obesity. He has no history of hypothyroidism. Pertinent negatives include no chest pain or shortness of breath. Current antihyperlipidemic treatment includes statins. The current treatment provides moderate improvement of lipids. Compliance problems include adherence to diet and adherence to exercise.  Risk factors for coronary artery disease include diabetes mellitus, dyslipidemia, hypertension and male sex.  Diabetes He presents for his follow-up diabetic visit. He has type 2 diabetes mellitus. No MedicAlert identification noted. His disease course has been stable. Pertinent negatives for hypoglycemia include no headaches or tremors. Pertinent negatives for diabetes include no chest pain, no fatigue, no polydipsia, no polyphagia, no polyuria and no visual change. There are no hypoglycemic complications. Symptoms are stable. There are no diabetic complications. Risk factors for coronary artery disease include diabetes mellitus, dyslipidemia, hypertension, male sex and obesity. Current diabetic treatment includes oral agent (monotherapy).  He is compliant with treatment most of the time. His weight is stable. When asked about meal planning, he reported none. He has not had a previous visit with a dietitian. He rarely participates in exercise. His breakfast blood glucose is taken between 8-9 am. His breakfast blood glucose range is generally 130-140 mg/dl. His overall blood glucose range is 130-140 mg/dl. An ACE inhibitor/angiotensin II receptor blocker is being taken. He does not see a podiatrist.Eye exam is not current.  BPH Dr. Risa Grill- every 6 months- Last PSA was less than 0.4.    Review of Systems  Constitutional: Negative.  Negative for fatigue.  HENT: Negative.   Respiratory: Negative for shortness of breath.   Cardiovascular: Negative for chest pain and palpitations.  Endocrine: Negative for polydipsia, polyphagia and polyuria.  Neurological: Negative.  Negative for tremors and headaches.  Psychiatric/Behavioral: Negative.   All other systems reviewed and are negative.      Objective:   Physical Exam  Constitutional: He is oriented to person, place, and time. He appears well-developed and well-nourished.  HENT:  Head: Normocephalic.  Right Ear: External ear normal.  Left Ear: External ear normal.  Nose: Nose normal.  Mouth/Throat: Oropharynx is clear and moist.  Eyes: EOM are normal. Pupils are equal, round, and reactive to light.  Neck: Normal range of motion. Neck supple. No thyromegaly present.  Cardiovascular: Normal rate, regular rhythm, normal heart sounds and intact distal pulses.   No murmur heard. Pulmonary/Chest: Effort normal and breath sounds normal. He has no wheezes. He has no rales.  Abdominal: Soft. Bowel sounds are normal.  Genitourinary:  DRE deferred to Dr. Risa Grill  Musculoskeletal: Normal range of motion.  Neurological: He is alert and oriented to person, place, and time.  Skin: Skin is warm and dry.  Large nodule post neck- no change  Psychiatric: He has a  normal mood and affect. His  behavior is normal. Judgment and thought content normal.    BP 144/86 mmHg  Pulse 72  Temp(Src) 96.8 F (36 C) (Oral)  Ht $R'6\' 1"'pK$  (1.854 m)  Wt 243 lb (110.224 kg)  BMI 32.07 kg/m2  Results for orders placed or performed in visit on 03/28/15  POCT glycosylated hemoglobin (Hb A1C)  Result Value Ref Range   Hemoglobin A1C 7.2             Assessment & Plan:  1. DM (diabetes mellitus) type 2, uncontrolled, with ketoacidosis Continue to watch carbs in diet - POCT glycosylated hemoglobin (Hb A1C)  2. Hyperlipidemia Low fat diet - Lipid panel  3. Essential hypertension Do not add salt to diet - CMP14+EGFR  4. BPH (benign prostatic hyperplasia) Keep follow  Up with urologist  5. Screening for malignant neoplasm of the rectum - Fecal occult blood, imunochemical; Future  6. BMI 32.0-32.9,adult Discussed diet and exercise for person with BMI >25 Will recheck weight in 3-6 months     Labs pending Health maintenance reviewed Diet and exercise encouraged Continue all meds Follow up  In 3 month   [place], FNP

## 2015-03-28 NOTE — Patient Instructions (Signed)

## 2015-03-29 LAB — LIPID PANEL
CHOL/HDL RATIO: 2.5 ratio (ref 0.0–5.0)
CHOLESTEROL TOTAL: 122 mg/dL (ref 100–199)
HDL: 49 mg/dL (ref >39)
LDL Calculated: 62 mg/dL (ref 0–99)
TRIGLYCERIDES: 55 mg/dL (ref 0–149)
VLDL Cholesterol Cal: 11 mg/dL (ref 5–40)

## 2015-03-29 LAB — CMP14+EGFR
ALK PHOS: 75 IU/L (ref 39–117)
ALT: 22 IU/L (ref 0–44)
AST: 22 IU/L (ref 0–40)
Albumin/Globulin Ratio: 1.6 (ref 1.1–2.5)
Albumin: 4.3 g/dL (ref 3.6–4.8)
BUN/Creatinine Ratio: 12 (ref 10–22)
BUN: 15 mg/dL (ref 8–27)
Bilirubin Total: 1.4 mg/dL — ABNORMAL HIGH (ref 0.0–1.2)
CHLORIDE: 98 mmol/L (ref 97–108)
CO2: 26 mmol/L (ref 18–29)
Calcium: 9.8 mg/dL (ref 8.6–10.2)
Creatinine, Ser: 1.21 mg/dL (ref 0.76–1.27)
GFR calc non Af Amer: 62 mL/min/{1.73_m2} (ref >59)
GFR, EST AFRICAN AMERICAN: 72 mL/min/{1.73_m2} (ref >59)
GLOBULIN, TOTAL: 2.7 g/dL (ref 1.5–4.5)
Glucose: 150 mg/dL — ABNORMAL HIGH (ref 65–99)
Potassium: 3.5 mmol/L (ref 3.5–5.2)
SODIUM: 138 mmol/L (ref 134–144)
Total Protein: 7 g/dL (ref 6.0–8.5)

## 2015-04-02 ENCOUNTER — Telehealth: Payer: Self-pay | Admitting: Nurse Practitioner

## 2015-04-14 ENCOUNTER — Other Ambulatory Visit: Payer: BLUE CROSS/BLUE SHIELD

## 2015-04-14 DIAGNOSIS — Z1212 Encounter for screening for malignant neoplasm of rectum: Secondary | ICD-10-CM

## 2015-04-16 LAB — FECAL OCCULT BLOOD, IMMUNOCHEMICAL: FECAL OCCULT BLD: NEGATIVE

## 2015-06-10 ENCOUNTER — Encounter: Payer: Self-pay | Admitting: Nurse Practitioner

## 2015-07-10 ENCOUNTER — Encounter: Payer: Self-pay | Admitting: Nurse Practitioner

## 2015-07-10 ENCOUNTER — Ambulatory Visit (INDEPENDENT_AMBULATORY_CARE_PROVIDER_SITE_OTHER): Payer: BLUE CROSS/BLUE SHIELD | Admitting: Nurse Practitioner

## 2015-07-10 VITALS — BP 132/88 | HR 85 | Temp 97.7°F | Ht 73.0 in | Wt 232.0 lb

## 2015-07-10 DIAGNOSIS — E1110 Type 2 diabetes mellitus with ketoacidosis without coma: Secondary | ICD-10-CM

## 2015-07-10 DIAGNOSIS — I10 Essential (primary) hypertension: Secondary | ICD-10-CM | POA: Diagnosis not present

## 2015-07-10 DIAGNOSIS — Z1159 Encounter for screening for other viral diseases: Secondary | ICD-10-CM

## 2015-07-10 DIAGNOSIS — E785 Hyperlipidemia, unspecified: Secondary | ICD-10-CM | POA: Diagnosis not present

## 2015-07-10 DIAGNOSIS — E119 Type 2 diabetes mellitus without complications: Secondary | ICD-10-CM

## 2015-07-10 DIAGNOSIS — Z6832 Body mass index (BMI) 32.0-32.9, adult: Secondary | ICD-10-CM | POA: Diagnosis not present

## 2015-07-10 DIAGNOSIS — Z23 Encounter for immunization: Secondary | ICD-10-CM

## 2015-07-10 DIAGNOSIS — N400 Enlarged prostate without lower urinary tract symptoms: Secondary | ICD-10-CM

## 2015-07-10 DIAGNOSIS — E1310 Other specified diabetes mellitus with ketoacidosis without coma: Secondary | ICD-10-CM

## 2015-07-10 LAB — POCT GLYCOSYLATED HEMOGLOBIN (HGB A1C): HEMOGLOBIN A1C: 7

## 2015-07-10 MED ORDER — LOSARTAN POTASSIUM 50 MG PO TABS: 50.0000 mg | ORAL_TABLET | Freq: Every day | ORAL | Status: DC

## 2015-07-10 MED ORDER — AMLODIPINE BESYLATE 10 MG PO TABS: 10.0000 mg | ORAL_TABLET | Freq: Every day | ORAL | Status: DC

## 2015-07-10 MED ORDER — FINASTERIDE 5 MG PO TABS: ORAL_TABLET | ORAL | Status: DC

## 2015-07-10 MED ORDER — ATORVASTATIN CALCIUM 40 MG PO TABS
40.0000 mg | ORAL_TABLET | Freq: Every day | ORAL | Status: DC
Start: 2015-07-10 — End: 2015-10-13

## 2015-07-10 MED ORDER — METFORMIN HCL 500 MG PO TABS: 500.0000 mg | ORAL_TABLET | Freq: Two times a day (BID) | ORAL | Status: DC

## 2015-07-10 MED ORDER — HYDROCHLOROTHIAZIDE 25 MG PO TABS: 25.0000 mg | ORAL_TABLET | Freq: Every day | ORAL | Status: DC

## 2015-07-10 NOTE — Progress Notes (Signed)
Subjective:    Patient ID: Jonathon Adkins, male    DOB: [birth year], 64 y.o.   MRN: [id number]  Patient here today for follow up- no changes since last visit.  Hypertension This is a chronic problem. The current episode started more than 1 year ago. The problem is unchanged. The problem is controlled. Pertinent negatives include no chest pain, headaches, palpitations or shortness of breath. Risk factors for coronary artery disease include dyslipidemia, diabetes mellitus and obesity. Past treatments include angiotensin blockers, calcium channel blockers and diuretics. The current treatment provides moderate improvement. Compliance problems include diet and exercise.   Hyperlipidemia This is a chronic problem. The current episode started more than 1 year ago. The problem is controlled. Recent lipid tests were reviewed and are normal. Exacerbating diseases include diabetes and obesity. He has no history of hypothyroidism. Pertinent negatives include no chest pain or shortness of breath. Current antihyperlipidemic treatment includes statins. The current treatment provides moderate improvement of lipids. Compliance problems include adherence to diet and adherence to exercise.  Risk factors for coronary artery disease include diabetes mellitus, dyslipidemia, hypertension and male sex.  Diabetes He presents for his follow-up diabetic visit. He has type 2 diabetes mellitus. No MedicAlert identification noted. His disease course has been stable. Pertinent negatives for hypoglycemia include no headaches or tremors. Pertinent negatives for diabetes include no chest pain, no fatigue, no polydipsia, no polyphagia, no polyuria and no visual change. There are no hypoglycemic complications. Symptoms are stable. There are no diabetic complications. Risk factors for coronary artery disease include diabetes mellitus, dyslipidemia, hypertension, male sex and obesity. Current diabetic treatment includes oral agent (monotherapy).  He is compliant with treatment most of the time. His weight is stable. When asked about meal planning, he reported none. He has not had a previous visit with a dietitian. He rarely participates in exercise. His breakfast blood glucose is taken between 8-9 am. His breakfast blood glucose range is generally 130-140 mg/dl. His overall blood glucose range is 130-140 mg/dl. An ACE inhibitor/angiotensin II receptor blocker is being taken. He does not see a podiatrist.Eye exam is not current.  BPH Dr. Risa Grill- every 6 months- Last PSA was less than 0.4.    Review of Systems  Constitutional: Negative.  Negative for fatigue.  HENT: Negative.   Respiratory: Negative for shortness of breath.   Cardiovascular: Negative for chest pain and palpitations.  Endocrine: Negative for polydipsia, polyphagia and polyuria.  Neurological: Negative.  Negative for tremors and headaches.  Psychiatric/Behavioral: Negative.   All other systems reviewed and are negative.      Objective:   Physical Exam  Constitutional: He is oriented to person, place, and time. He appears well-developed and well-nourished.  HENT:  Head: Normocephalic.  Right Ear: External ear normal.  Left Ear: External ear normal.  Nose: Nose normal.  Mouth/Throat: Oropharynx is clear and moist.  Eyes: EOM are normal. Pupils are equal, round, and reactive to light.  Neck: Normal range of motion. Neck supple. No thyromegaly present.  Cardiovascular: Normal rate, regular rhythm, normal heart sounds and intact distal pulses.   No murmur heard. Pulmonary/Chest: Effort normal and breath sounds normal. He has no wheezes. He has no rales.  Abdominal: Soft. Bowel sounds are normal.  Genitourinary:  DRE deferred to Dr. Risa Grill  Musculoskeletal: Normal range of motion.  Neurological: He is alert and oriented to person, place, and time.  Skin: Skin is warm and dry.  Large nodule post neck- no change  Psychiatric: He has a  normal mood and affect. His  behavior is normal. Judgment and thought content normal.   BP 132/88 mmHg  Pulse 85  Temp(Src) 97.7 F (36.5 C) (Oral)  Ht _0  (1.854 m)  Wt 232 lb (105.235 kg)  BMI 30.62 kg/m2    Results for orders placed or performed in visit on 07/10/15  POCT glycosylated hemoglobin (Hb A1C)  Result Value Ref Range   Hemoglobin A1C 7.0             Assessment & Plan:   1. Uncontrolled type 2 diabetes mellitus with ketoacidosis without coma, without long-term current use of insulin (HCC) Continue to watch carbs in diet - POCT glycosylated hemoglobin (Hb A1C) - Microalbumin, urine  2. Hyperlipidemia *low fat diet** - Lipid panel  3. Essential hypertension Do not add saltto diet - CMP14+EGFR - losartan (COZAAR) 50 MG tablet; Take 1 tablet (50 mg total) by mouth daily.  Dispense: 90 tablet; Refill: 1 - hydrochlorothiazide (HYDRODIURIL) 25 MG tablet; Take 1 tablet (25 mg total) by mouth daily.  Dispense: 90 tablet; Refill: 1 - amLODipine (NORVASC) 10 MG tablet; Take 1 tablet (10 mg total) by mouth daily.  Dispense: 90 tablet; Refill: 1  4. BPH (benign prostatic hyperplasia) - finasteride (PROSCAR) 5 MG tablet; TAKE ONE TABLET BY MOUTH ONCE DAILY  Dispense: 90 tablet; Refill: 1  5. BMI 32.0-32.9,adult Discussed diet and exercise for person with BMI >25 Will recheck weight in 3-6 months  6. Hyperlipidemia with target LDL less than 100  - atorvastatin (LIPITOR) 40 MG tablet; Take 1 tablet (40 mg total) by mouth daily.  Dispense: 90 tablet; Refill: 1  7. Type 2 diabetes mellitus without complication, without long-term current use of insulin (HCC)  - metFORMIN (GLUCOPHAGE) 500 MG tablet; Take 1 tablet (500 mg total) by mouth 2 (two) times daily with a meal.  Dispense: 180 tablet; Refill: 1   Counseling done on all immunizations given today- flu Labs pending Health maintenance reviewed Diet and exercise encouraged Continue all meds Follow up  In 3 months   [place], FNP

## 2015-07-10 NOTE — Patient Instructions (Signed)

## 2015-07-11 LAB — LIPID PANEL
CHOLESTEROL TOTAL: 128 mg/dL (ref 100–199)
Chol/HDL Ratio: 2.5 ratio units (ref 0.0–5.0)
HDL: 52 mg/dL (ref >39)
LDL Calculated: 63 mg/dL (ref 0–99)
TRIGLYCERIDES: 65 mg/dL (ref 0–149)
VLDL CHOLESTEROL CAL: 13 mg/dL (ref 5–40)

## 2015-07-11 LAB — CMP14+EGFR
A/G RATIO: 1.4 (ref 1.1–2.5)
ALT: 19 IU/L (ref 0–44)
AST: 15 IU/L (ref 0–40)
Albumin: 4.3 g/dL (ref 3.6–4.8)
Alkaline Phosphatase: 79 IU/L (ref 39–117)
BUN/Creatinine Ratio: 12 (ref 10–22)
BUN: 13 mg/dL (ref 8–27)
Bilirubin Total: 1.2 mg/dL (ref 0.0–1.2)
CALCIUM: 10 mg/dL (ref 8.6–10.2)
CHLORIDE: 100 mmol/L (ref 97–106)
CO2: 23 mmol/L (ref 18–29)
Creatinine, Ser: 1.1 mg/dL (ref 0.76–1.27)
GFR calc Af Amer: 80 mL/min/{1.73_m2} (ref >59)
GFR calc non Af Amer: 70 mL/min/{1.73_m2} (ref >59)
GLUCOSE: 139 mg/dL — AB (ref 65–99)
Globulin, Total: 3 g/dL (ref 1.5–4.5)
Potassium: 3.9 mmol/L (ref 3.5–5.2)
Sodium: 139 mmol/L (ref 136–144)
Total Protein: 7.3 g/dL (ref 6.0–8.5)

## 2015-07-11 LAB — MICROALBUMIN, URINE: Microalbumin, Urine: 16.8 ug/mL

## 2015-07-11 LAB — HEPATITIS C ANTIBODY: HEP C VIRUS AB: 0.2 {s_co_ratio} (ref 0.0–0.9)

## 2015-10-13 ENCOUNTER — Encounter: Payer: Self-pay | Admitting: Nurse Practitioner

## 2015-10-13 ENCOUNTER — Ambulatory Visit (INDEPENDENT_AMBULATORY_CARE_PROVIDER_SITE_OTHER): Payer: BLUE CROSS/BLUE SHIELD | Admitting: Nurse Practitioner

## 2015-10-13 VITALS — BP 147/89 | HR 85 | Temp 97.8°F | Ht 73.0 in | Wt 234.0 lb

## 2015-10-13 DIAGNOSIS — E1110 Type 2 diabetes mellitus with ketoacidosis without coma: Secondary | ICD-10-CM

## 2015-10-13 DIAGNOSIS — Z23 Encounter for immunization: Secondary | ICD-10-CM

## 2015-10-13 DIAGNOSIS — E785 Hyperlipidemia, unspecified: Secondary | ICD-10-CM

## 2015-10-13 DIAGNOSIS — E1310 Other specified diabetes mellitus with ketoacidosis without coma: Secondary | ICD-10-CM

## 2015-10-13 DIAGNOSIS — E119 Type 2 diabetes mellitus without complications: Secondary | ICD-10-CM

## 2015-10-13 DIAGNOSIS — I10 Essential (primary) hypertension: Secondary | ICD-10-CM | POA: Diagnosis not present

## 2015-10-13 DIAGNOSIS — N400 Enlarged prostate without lower urinary tract symptoms: Secondary | ICD-10-CM

## 2015-10-13 DIAGNOSIS — Z6832 Body mass index (BMI) 32.0-32.9, adult: Secondary | ICD-10-CM

## 2015-10-13 LAB — POCT GLYCOSYLATED HEMOGLOBIN (HGB A1C): Hemoglobin A1C: 7

## 2015-10-13 MED ORDER — ATORVASTATIN CALCIUM 40 MG PO TABS: 40.0000 mg | ORAL_TABLET | Freq: Every day | ORAL | Status: DC

## 2015-10-13 MED ORDER — LOSARTAN POTASSIUM 50 MG PO TABS: 50.0000 mg | ORAL_TABLET | Freq: Every day | ORAL | Status: DC

## 2015-10-13 MED ORDER — METFORMIN HCL 500 MG PO TABS: 500.0000 mg | ORAL_TABLET | Freq: Two times a day (BID) | ORAL | Status: DC

## 2015-10-13 MED ORDER — AMLODIPINE BESYLATE 10 MG PO TABS: 10.0000 mg | ORAL_TABLET | Freq: Every day | ORAL | Status: DC

## 2015-10-13 MED ORDER — HYDROCHLOROTHIAZIDE 25 MG PO TABS: 25.0000 mg | ORAL_TABLET | Freq: Every day | ORAL | Status: DC

## 2015-10-13 MED ORDER — FINASTERIDE 5 MG PO TABS: ORAL_TABLET | ORAL | Status: DC

## 2015-10-13 NOTE — Progress Notes (Signed)
Subjective:    Patient ID: Jonathon Adkins, male    DOB: 1951/01/22, 65 y.o.   MRN: [id number]  Patient here today for follow up of chronic medical problems.  Outpatient Encounter Prescriptions as of 10/13/2015  Medication Sig  . amLODipine (NORVASC) 10 MG tablet Take 1 tablet (10 mg total) by mouth daily.  Marland Kitchen atorvastatin (LIPITOR) 40 MG tablet Take 1 tablet (40 mg total) by mouth daily.  . cholecalciferol (VITAMIN D) 1000 UNITS tablet Take 1 tablet (1,000 Units total) by mouth daily.  . finasteride (PROSCAR) 5 MG tablet TAKE ONE TABLET BY MOUTH ONCE DAILY  . hydrochlorothiazide (HYDRODIURIL) 25 MG tablet Take 1 tablet (25 mg total) by mouth daily.  Marland Kitchen losartan (COZAAR) 50 MG tablet Take 1 tablet (50 mg total) by mouth daily.  . metFORMIN (GLUCOPHAGE) 500 MG tablet Take 1 tablet (500 mg total) by mouth 2 (two) times daily with a meal.   No facility-administered encounter medications on file as of 10/13/2015.     Hypertension This is a chronic problem. The current episode started more than 1 year ago. The problem is unchanged. The problem is controlled. Pertinent negatives include no chest pain, headaches, palpitations or shortness of breath. Risk factors for coronary artery disease include dyslipidemia, diabetes mellitus and obesity. Past treatments include angiotensin blockers, calcium channel blockers and diuretics. The current treatment provides moderate improvement. Compliance problems include diet and exercise.   Hyperlipidemia This is a chronic problem. The current episode started more than 1 year ago. The problem is controlled. Recent lipid tests were reviewed and are normal. Exacerbating diseases include diabetes and obesity. He has no history of hypothyroidism. Pertinent negatives include no chest pain or shortness of breath. Current antihyperlipidemic treatment includes statins. The current treatment provides moderate improvement of lipids. Compliance problems include adherence to diet and  adherence to exercise.  Risk factors for coronary artery disease include diabetes mellitus, dyslipidemia, hypertension and male sex.  Diabetes He presents for his follow-up diabetic visit. He has type 2 diabetes mellitus. No MedicAlert identification noted. His disease course has been stable. Pertinent negatives for hypoglycemia include no headaches or tremors. Pertinent negatives for diabetes include no chest pain, no fatigue, no polydipsia, no polyphagia, no polyuria and no visual change. There are no hypoglycemic complications. Symptoms are stable. There are no diabetic complications. Risk factors for coronary artery disease include diabetes mellitus, dyslipidemia, hypertension, male sex and obesity. Current diabetic treatment includes oral agent (monotherapy). He is compliant with treatment most of the time. His weight is stable. When asked about meal planning, he reported none. He has not had a previous visit with a dietitian. He rarely participates in exercise. His breakfast blood glucose is taken between 8-9 am. His breakfast blood glucose range is generally 130-140 mg/dl. His overall blood glucose range is 130-140 mg/dl. An ACE inhibitor/angiotensin II receptor blocker is being taken. He does not see a podiatrist.Eye exam is not current.  BPH Dr. Risa Grill- every 6 months- Last PSA was less than 0.4.    Review of Systems  Constitutional: Negative.  Negative for fatigue.  HENT: Negative.   Respiratory: Negative for shortness of breath.   Cardiovascular: Negative for chest pain and palpitations.  Endocrine: Negative for polydipsia, polyphagia and polyuria.  Neurological: Negative.  Negative for tremors and headaches.  Psychiatric/Behavioral: Negative.   All other systems reviewed and are negative.      Objective:   Physical Exam  Constitutional: He is oriented to person, place, and time. He appears  well-developed and well-nourished.  HENT:  Head: Normocephalic.  Right Ear: External ear  normal.  Left Ear: External ear normal.  Nose: Nose normal.  Mouth/Throat: Oropharynx is clear and moist.  Eyes: EOM are normal. Pupils are equal, round, and reactive to light.  Neck: Normal range of motion. Neck supple. No thyromegaly present.  Cardiovascular: Normal rate, regular rhythm, normal heart sounds and intact distal pulses.   No murmur heard. Pulmonary/Chest: Effort normal and breath sounds normal. He has no wheezes. He has no rales.  Abdominal: Soft. Bowel sounds are normal.  Genitourinary:  DRE deferred to Dr. Risa Grill  Musculoskeletal: Normal range of motion.  Neurological: He is alert and oriented to person, place, and time.  Skin: Skin is warm and dry.  Large nodule post neck- no change  Psychiatric: He has a normal mood and affect. His behavior is normal. Judgment and thought content normal.    BP 147/89 mmHg  Pulse 85  Temp(Src) 97.8 F (36.6 C) (Oral)  Ht '6\' 1"'$  (1.854 m)  Wt 234 lb (106.142 kg)  BMI 30.88 kg/m2  Results for orders placed or performed in visit on 10/13/15  POCT glycosylated hemoglobin (Hb A1C)  Result Value Ref Range   Hemoglobin A1C 7.0        Assessment & Plan:   1. Uncontrolled type 2 diabetes mellitus with ketoacidosis without coma, without long-term current use of insulin (HCC) Continue to watch carbs in diet - POCT glycosylated hemoglobin (Hb A1C)- metFORMIN (GLUCOPHAGE) 500 MG tablet; Take 1 tablet (500 mg total) by mouth 2 (two) times daily with a meal.  Dispense: 180 tablet; Refill: 1  2. Hyperlipidemia Low fat diet - Lipid panel  3. Essential hypertension Do not add salt to diet - CMP14+EGFR - amLODipine (NORVASC) 10 MG tablet; Take 1 tablet (10 mg total) by mouth daily.  Dispense: 90 tablet; Refill: 1 - hydrochlorothiazide (HYDRODIURIL) 25 MG tablet; Take 1 tablet (25 mg total) by mouth daily.  Dispense: 90 tablet; Refill: 1 - losartan (COZAAR) 50 MG tablet; Take 1 tablet (50 mg total) by mouth daily.  Dispense: 90 tablet;  Refill: 1  4. BMI 32.0-32.9,adult Discussed diet and exercise for person with BMI >25 Will recheck weight in 3-6 months   5. BPH (benign prostatic hyperplasia) - finasteride (PROSCAR) 5 MG tablet; TAKE ONE TABLET BY MOUTH ONCE DAILY  Dispense: 90 tablet; Refill: 1  6. Hyperlipidemia with target LDL less than 100 Low fat diet - atorvastatin (LIPITOR) 40 MG tablet; Take 1 tablet (40 mg total) by mouth daily.  Dispense: 90 tablet; Refill: 1    Labs pending Health maintenance reviewed Diet and exercise encouraged Continue all meds Follow up  In 3 month   [place], FNP

## 2015-10-13 NOTE — Patient Instructions (Signed)
Bone Health Bones protect organs, store calcium, and anchor muscles. Good health habits, such as eating nutritious foods and exercising regularly, are important for maintaining healthy bones. They can also help to prevent a condition that causes bones to lose density and become weak and brittle (osteoporosis). WHY IS BONE MASS IMPORTANT? Bone mass refers to the amount of bone tissue that you have. The higher your bone mass, the stronger your bones. An important step toward having healthy bones throughout life is to have strong and dense bones during childhood. A young adult who has a high bone mass is more likely to have a high bone mass later in life. Bone mass at its greatest it is called peak bone mass. A large decline in bone mass occurs in older adults. In women, it occurs about the time of menopause. During this time, it is important to practice good health habits, because if more bone is lost than what is replaced, the bones will become less healthy and more likely to break (fracture). If you find that you have a low bone mass, you may be able to prevent osteoporosis or further bone loss by changing your diet and lifestyle. HOW CAN I FIND OUT IF MY BONE MASS IS LOW? Bone mass can be measured with an X-ray test that is called a bone mineral density (BMD) test. This test is recommended for all women who are age 65 or older. It may also be recommended for men who are age 70 or older, or for people who are more likely to develop osteoporosis due to:  Having bones that break easily.  Having a long-term disease that weakens bones, such as kidney disease or rheumatoid arthritis.  Having menopause earlier than normal.  Taking medicine that weakens bones, such as steroids, thyroid hormones, or hormone treatment for breast cancer or prostate cancer.  Smoking.  Drinking three or more alcoholic drinks each day. WHAT ARE THE NUTRITIONAL RECOMMENDATIONS FOR HEALTHY BONES? To have healthy bones, you need  to get enough of the right minerals and vitamins. Most nutrition experts recommend getting these nutrients from the foods that you eat. Nutritional recommendations vary from person to person. Ask your health care provider what is healthy for you. Here are some general guidelines. Calcium Recommendations Calcium is the most important (essential) mineral for bone health. Most people can get enough calcium from their diet, but supplements may be recommended for people who are at risk for osteoporosis. Good sources of calcium include:  Dairy products, such as low-fat or nonfat milk, cheese, and yogurt.  Dark green leafy vegetables, such as bok choy and broccoli.  Calcium-fortified foods, such as orange juice, cereal, bread, soy beverages, and tofu products.  Nuts, such as almonds. Follow these recommended amounts for daily calcium intake:  Children, age 1-3: 700 mg.  Children, age 4-8: 1,000 mg.  Children, age 9-13: 1,300 mg.  Teens, age 14-18: 1,300 mg.  Adults, age 19-50: 1,000 mg.  Adults, age 51-70:  Men: 1,000 mg.  Women: 1,200 mg.  Adults, age 71 or older: 1,200 mg.  Pregnant and breastfeeding females:  Teens: 1,300 mg.  Adults: 1,000 mg. Vitamin D Recommendations Vitamin D is the most essential vitamin for bone health. It helps the body to absorb calcium. Sunlight stimulates the skin to make vitamin D, so be sure to get enough sunlight. If you live in a cold climate or you do not get outside often, your health care provider may recommend that you take vitamin D supplements. Good   sources of vitamin D in your diet include:  Egg yolks.  Saltwater fish.  Milk and cereal fortified with vitamin D. Follow these recommended amounts for daily vitamin D intake:  Children and teens, age 1-18: 600 international units.  Adults, age 50 or younger: 400-800 international units.  Adults, age 51 or older: 800-1,000 international units. Other Nutrients Other nutrients for bone  health include:  Phosphorus. This mineral is found in meat, poultry, dairy foods, nuts, and legumes. The recommended daily intake for adult men and adult women is 700 mg.  Magnesium. This mineral is found in seeds, nuts, dark green vegetables, and legumes. The recommended daily intake for adult men is 400-420 mg. For adult women, it is 310-320 mg.  Vitamin K. This vitamin is found in green leafy vegetables. The recommended daily intake is 120 mg for adult men and 90 mg for adult women. WHAT TYPE OF PHYSICAL ACTIVITY IS BEST FOR BUILDING AND MAINTAINING HEALTHY BONES? Weight-bearing and strength-building activities are important for building and maintaining peak bone mass. Weight-bearing activities cause muscles and bones to work against gravity. Strength-building activities increases muscle strength that supports bones. Weight-bearing and muscle-building activities include:  Walking and hiking.  Jogging and running.  Dancing.  Gym exercises.  Lifting weights.  Tennis and racquetball.  Climbing stairs.  Aerobics. Adults should get at least 30 minutes of moderate physical activity on most days. Children should get at least 60 minutes of moderate physical activity on most days. Ask your health care provide what type of exercise is best for you. WHERE CAN I FIND MORE INFORMATION? For more information, check out the following websites:  National Osteoporosis Foundation: http://nof.org/learn/basics  National Institutes of Health: http://www.niams.nih.gov/Health_Info/Bone/Bone_Health/bone_health_for_life.asp   This information is not intended to replace advice given to you by your health care provider. Make sure you discuss any questions you have with your health care provider.   Document Released: 10/30/2003 Document Revised: 12/24/2014 Document Reviewed: 08/14/2014 Elsevier Interactive Patient Education 2016 [employer]  

## 2015-10-13 NOTE — Addendum Note (Signed)
Addended by: Rolena Infante on: 10/13/2015 09:42 AM   Modules accepted: Orders

## 2015-10-14 LAB — LIPID PANEL
CHOLESTEROL TOTAL: 139 mg/dL (ref 100–199)
Chol/HDL Ratio: 2.6 ratio units (ref 0.0–5.0)
HDL: 53 mg/dL (ref >39)
LDL CALC: 71 mg/dL (ref 0–99)
Triglycerides: 73 mg/dL (ref 0–149)
VLDL CHOLESTEROL CAL: 15 mg/dL (ref 5–40)

## 2015-10-14 LAB — CMP14+EGFR
ALBUMIN: 4.1 g/dL (ref 3.6–4.8)
ALT: 22 IU/L (ref 0–44)
AST: 18 IU/L (ref 0–40)
Albumin/Globulin Ratio: 1.3 (ref 1.1–2.5)
Alkaline Phosphatase: 81 IU/L (ref 39–117)
BILIRUBIN TOTAL: 0.9 mg/dL (ref 0.0–1.2)
BUN / CREAT RATIO: 13 (ref 10–22)
BUN: 15 mg/dL (ref 8–27)
CHLORIDE: 101 mmol/L (ref 96–106)
CO2: 25 mmol/L (ref 18–29)
CREATININE: 1.14 mg/dL (ref 0.76–1.27)
Calcium: 9.8 mg/dL (ref 8.6–10.2)
GFR calc non Af Amer: 67 mL/min/{1.73_m2} (ref >59)
GFR, EST AFRICAN AMERICAN: 77 mL/min/{1.73_m2} (ref >59)
GLUCOSE: 157 mg/dL — AB (ref 65–99)
Globulin, Total: 3.2 g/dL (ref 1.5–4.5)
Potassium: 3.6 mmol/L (ref 3.5–5.2)
Sodium: 143 mmol/L (ref 134–144)
TOTAL PROTEIN: 7.3 g/dL (ref 6.0–8.5)

## 2016-01-13 ENCOUNTER — Encounter: Payer: Self-pay | Admitting: Nurse Practitioner

## 2016-01-13 ENCOUNTER — Ambulatory Visit (INDEPENDENT_AMBULATORY_CARE_PROVIDER_SITE_OTHER): Payer: BLUE CROSS/BLUE SHIELD | Admitting: Nurse Practitioner

## 2016-01-13 VITALS — BP 139/86 | HR 75 | Temp 97.3°F | Ht 73.0 in | Wt 229.0 lb

## 2016-01-13 DIAGNOSIS — Z6832 Body mass index (BMI) 32.0-32.9, adult: Secondary | ICD-10-CM | POA: Diagnosis not present

## 2016-01-13 DIAGNOSIS — N400 Enlarged prostate without lower urinary tract symptoms: Secondary | ICD-10-CM

## 2016-01-13 DIAGNOSIS — E785 Hyperlipidemia, unspecified: Secondary | ICD-10-CM | POA: Diagnosis not present

## 2016-01-13 DIAGNOSIS — I10 Essential (primary) hypertension: Secondary | ICD-10-CM | POA: Diagnosis not present

## 2016-01-13 DIAGNOSIS — E1110 Type 2 diabetes mellitus with ketoacidosis without coma: Secondary | ICD-10-CM

## 2016-01-13 DIAGNOSIS — E1310 Other specified diabetes mellitus with ketoacidosis without coma: Secondary | ICD-10-CM | POA: Diagnosis not present

## 2016-01-13 LAB — CMP14+EGFR
ALT: 15 IU/L (ref 0–44)
AST: 14 IU/L (ref 0–40)
Albumin/Globulin Ratio: 1.5 (ref 1.2–2.2)
Albumin: 4.1 g/dL (ref 3.6–4.8)
Alkaline Phosphatase: 65 IU/L (ref 39–117)
BUN/Creatinine Ratio: 11 (ref 10–24)
BUN: 12 mg/dL (ref 8–27)
Bilirubin Total: 1 mg/dL (ref 0.0–1.2)
CO2: 24 mmol/L (ref 18–29)
Calcium: 9.7 mg/dL (ref 8.6–10.2)
Chloride: 102 mmol/L (ref 96–106)
Creatinine, Ser: 1.12 mg/dL (ref 0.76–1.27)
GFR calc Af Amer: 79 mL/min/{1.73_m2} (ref >59)
GFR calc non Af Amer: 68 mL/min/{1.73_m2} (ref >59)
Globulin, Total: 2.8 g/dL (ref 1.5–4.5)
Glucose: 134 mg/dL — ABNORMAL HIGH (ref 65–99)
Potassium: 3.9 mmol/L (ref 3.5–5.2)
Sodium: 142 mmol/L (ref 134–144)
Total Protein: 6.9 g/dL (ref 6.0–8.5)

## 2016-01-13 LAB — BAYER DCA HB A1C WAIVED: HB A1C: 7.1 % — AB (ref <7.0)

## 2016-01-13 LAB — LIPID PANEL
Chol/HDL Ratio: 2.4 ratio units (ref 0.0–5.0)
Cholesterol, Total: 127 mg/dL (ref 100–199)
HDL: 53 mg/dL (ref >39)
LDL CALC: 62 mg/dL (ref 0–99)
Triglycerides: 62 mg/dL (ref 0–149)
VLDL CHOLESTEROL CAL: 12 mg/dL (ref 5–40)

## 2016-01-13 NOTE — Progress Notes (Signed)
Subjective:    Patient ID: Jonathon Adkins, male    DOB: June 09, 1951, 65 y.o.   MRN: [id number]  Patient here today for follow up of chronic medical problems.  Outpatient Encounter Prescriptions as of 01/13/2016  Medication Sig  . amLODipine (NORVASC) 10 MG tablet Take 1 tablet (10 mg total) by mouth daily.  Marland Kitchen atorvastatin (LIPITOR) 40 MG tablet Take 1 tablet (40 mg total) by mouth daily.  . cholecalciferol (VITAMIN D) 1000 UNITS tablet Take 1 tablet (1,000 Units total) by mouth daily.  . finasteride (PROSCAR) 5 MG tablet TAKE ONE TABLET BY MOUTH ONCE DAILY  . hydrochlorothiazide (HYDRODIURIL) 25 MG tablet Take 1 tablet (25 mg total) by mouth daily.  Marland Kitchen losartan (COZAAR) 50 MG tablet Take 1 tablet (50 mg total) by mouth daily.  . metFORMIN (GLUCOPHAGE) 500 MG tablet Take 1 tablet (500 mg total) by mouth 2 (two) times daily with a meal.   No facility-administered encounter medications on file as of 01/13/2016.     Hypertension This is a chronic problem. The current episode started more than 1 year ago. The problem is unchanged. The problem is controlled. Pertinent negatives include no chest pain, headaches, palpitations or shortness of breath. Risk factors for coronary artery disease include dyslipidemia, diabetes mellitus and obesity. Past treatments include angiotensin blockers, calcium channel blockers and diuretics. The current treatment provides moderate improvement. Compliance problems include diet and exercise.   Hyperlipidemia This is a chronic problem. The current episode started more than 1 year ago. The problem is controlled. Recent lipid tests were reviewed and are normal. Exacerbating diseases include diabetes and obesity. He has no history of hypothyroidism. Pertinent negatives include no chest pain or shortness of breath. Current antihyperlipidemic treatment includes statins. The current treatment provides moderate improvement of lipids. Compliance problems include adherence to diet and  adherence to exercise.  Risk factors for coronary artery disease include diabetes mellitus, dyslipidemia, hypertension and male sex.  Diabetes He presents for his follow-up diabetic visit. He has type 2 diabetes mellitus. No MedicAlert identification noted. His disease course has been stable. Pertinent negatives for hypoglycemia include no headaches or tremors. Pertinent negatives for diabetes include no chest pain, no fatigue, no polydipsia, no polyphagia, no polyuria and no visual change. There are no hypoglycemic complications. Symptoms are stable. There are no diabetic complications. Risk factors for coronary artery disease include diabetes mellitus, dyslipidemia, hypertension, male sex and obesity. Current diabetic treatment includes oral agent (monotherapy). He is compliant with treatment most of the time. His weight is stable. When asked about meal planning, he reported none. He has not had a previous visit with a dietitian. He rarely participates in exercise. His breakfast blood glucose is taken between 8-9 am. His breakfast blood glucose range is generally 130-140 mg/dl. His overall blood glucose range is 130-140 mg/dl. An ACE inhibitor/angiotensin II receptor blocker is being taken. He does not see a podiatrist.Eye exam is not current.  BPH Dr. Risa Grill- every 6 months- Last PSA was less than 0.4.    Review of Systems  Constitutional: Negative.  Negative for fatigue.  HENT: Negative.   Respiratory: Negative for shortness of breath.   Cardiovascular: Negative for chest pain and palpitations.  Endocrine: Negative for polydipsia, polyphagia and polyuria.  Neurological: Negative.  Negative for tremors and headaches.  Psychiatric/Behavioral: Negative.   All other systems reviewed and are negative.      Objective:   Physical Exam  Constitutional: He is oriented to person, place, and time. He appears  well-developed and well-nourished.  HENT:  Head: Normocephalic.  Right Ear: External ear  normal.  Left Ear: External ear normal.  Nose: Nose normal.  Mouth/Throat: Oropharynx is clear and moist.  Eyes: EOM are normal. Pupils are equal, round, and reactive to light.  Neck: Normal range of motion. Neck supple. No thyromegaly present.  Cardiovascular: Normal rate, regular rhythm, normal heart sounds and intact distal pulses.   No murmur heard. Pulmonary/Chest: Effort normal and breath sounds normal. He has no wheezes. He has no rales.  Abdominal: Soft. Bowel sounds are normal.  Genitourinary:  DRE deferred to Dr. Risa Grill  Musculoskeletal: Normal range of motion.  Neurological: He is alert and oriented to person, place, and time.  Skin: Skin is warm and dry.  Large nodule post neck- no change  Psychiatric: He has a normal mood and affect. His behavior is normal. Judgment and thought content normal.    BP 139/86 mmHg  Pulse 75  Temp(Src) 97.3 F (36.3 C) (Oral)  Ht _0  (1.854 m)  Wt 229 lb (103.874 kg)  BMI 30.22 kg/m2  hgba1c-7.1% up from 7.0% at last visit     Assessment & Plan:   1. Uncontrolled type 2 diabetes mellitus with ketoacidosis without coma, without long-term current use of insulin (HCC) Continue to watch carbs in diet - Bayer DCA Hb A1c Waived  2. Hyperlipidemia Low fat diet - Lipid panel  3. Essential hypertension Do not add salt to diet - CMP14+EGFR  4. BPH (benign prostatic hyperplasia) Keep follow up appointment with DR. Grapey  5. BMI 32.0-32.9,adult Discussed diet and exercise for person with BMI >25 Will recheck weight in 3-6 months    Labs pending Health maintenance reviewed Diet and exercise encouraged Continue all meds Follow up  In 3 month   [place], FNP

## 2016-01-13 NOTE — Patient Instructions (Signed)

## 2016-03-17 LAB — HM DIABETES EYE EXAM

## 2016-04-19 ENCOUNTER — Encounter: Payer: Self-pay | Admitting: Nurse Practitioner

## 2016-04-19 ENCOUNTER — Ambulatory Visit (INDEPENDENT_AMBULATORY_CARE_PROVIDER_SITE_OTHER): Payer: BLUE CROSS/BLUE SHIELD | Admitting: Nurse Practitioner

## 2016-04-19 VITALS — BP 132/83 | HR 80 | Temp 98.2°F | Ht 73.0 in | Wt 230.0 lb

## 2016-04-19 DIAGNOSIS — E1310 Other specified diabetes mellitus with ketoacidosis without coma: Secondary | ICD-10-CM

## 2016-04-19 DIAGNOSIS — Z6832 Body mass index (BMI) 32.0-32.9, adult: Secondary | ICD-10-CM

## 2016-04-19 DIAGNOSIS — E119 Type 2 diabetes mellitus without complications: Secondary | ICD-10-CM

## 2016-04-19 DIAGNOSIS — N400 Enlarged prostate without lower urinary tract symptoms: Secondary | ICD-10-CM

## 2016-04-19 DIAGNOSIS — E785 Hyperlipidemia, unspecified: Secondary | ICD-10-CM | POA: Diagnosis not present

## 2016-04-19 DIAGNOSIS — I10 Essential (primary) hypertension: Secondary | ICD-10-CM

## 2016-04-19 DIAGNOSIS — E1110 Type 2 diabetes mellitus with ketoacidosis without coma: Secondary | ICD-10-CM

## 2016-04-19 LAB — BAYER DCA HB A1C WAIVED: HB A1C (BAYER DCA - WAIVED): 6.8 % (ref <7.0)

## 2016-04-19 MED ORDER — AMLODIPINE BESYLATE 10 MG PO TABS: 10.0000 mg | ORAL_TABLET | Freq: Every day | ORAL | 1 refills | Status: DC

## 2016-04-19 MED ORDER — ATORVASTATIN CALCIUM 40 MG PO TABS: 40.0000 mg | ORAL_TABLET | Freq: Every day | ORAL | 1 refills | Status: DC

## 2016-04-19 MED ORDER — LOSARTAN POTASSIUM 50 MG PO TABS: 50.0000 mg | ORAL_TABLET | Freq: Every day | ORAL | 1 refills | Status: DC

## 2016-04-19 MED ORDER — HYDROCHLOROTHIAZIDE 25 MG PO TABS: 25.0000 mg | ORAL_TABLET | Freq: Every day | ORAL | 1 refills | Status: DC

## 2016-04-19 MED ORDER — METFORMIN HCL 500 MG PO TABS: 500.0000 mg | ORAL_TABLET | Freq: Two times a day (BID) | ORAL | 1 refills | Status: DC

## 2016-04-19 MED ORDER — FINASTERIDE 5 MG PO TABS: ORAL_TABLET | ORAL | 1 refills | Status: DC

## 2016-04-19 NOTE — Progress Notes (Signed)
Subjective:    Patient ID: Jonathon Adkins, male    DOB: 1951-06-21, 65 y.o.   MRN: [id number]  Patient here today for follow up of chronic medical problems. NO changes since last visit. No complaints today.  Outpatient Encounter Prescriptions as of 04/19/2016  Medication Sig  . amLODipine (NORVASC) 10 MG tablet Take 1 tablet (10 mg total) by mouth daily.  Marland Kitchen atorvastatin (LIPITOR) 40 MG tablet Take 1 tablet (40 mg total) by mouth daily.  . cholecalciferol (VITAMIN D) 1000 UNITS tablet Take 1 tablet (1,000 Units total) by mouth daily.  . finasteride (PROSCAR) 5 MG tablet TAKE ONE TABLET BY MOUTH ONCE DAILY  . hydrochlorothiazide (HYDRODIURIL) 25 MG tablet Take 1 tablet (25 mg total) by mouth daily.  Marland Kitchen losartan (COZAAR) 50 MG tablet Take 1 tablet (50 mg total) by mouth daily.  . metFORMIN (GLUCOPHAGE) 500 MG tablet Take 1 tablet (500 mg total) by mouth 2 (two) times daily with a meal.   No facility-administered encounter medications on file as of 04/19/2016.      Hypertension  This is a chronic problem. The current episode started more than 1 year ago. The problem is unchanged. The problem is controlled. Pertinent negatives include no chest pain, headaches, palpitations or shortness of breath. Risk factors for coronary artery disease include dyslipidemia, diabetes mellitus and obesity. Past treatments include angiotensin blockers, calcium channel blockers and diuretics. The current treatment provides moderate improvement. Compliance problems include diet and exercise.   Hyperlipidemia  This is a chronic problem. The current episode started more than 1 year ago. The problem is controlled. Recent lipid tests were reviewed and are normal. Exacerbating diseases include diabetes and obesity. He has no history of hypothyroidism. Pertinent negatives include no chest pain or shortness of breath. Current antihyperlipidemic treatment includes statins. The current treatment provides moderate improvement of  lipids. Compliance problems include adherence to diet and adherence to exercise.  Risk factors for coronary artery disease include diabetes mellitus, dyslipidemia, hypertension and male sex.  Diabetes  He presents for his follow-up diabetic visit. He has type 2 diabetes mellitus. No MedicAlert identification noted. His disease course has been stable. Pertinent negatives for hypoglycemia include no headaches or tremors. Pertinent negatives for diabetes include no chest pain, no fatigue, no polydipsia, no polyphagia, no polyuria and no visual change. There are no hypoglycemic complications. Symptoms are stable. There are no diabetic complications. Risk factors for coronary artery disease include diabetes mellitus, dyslipidemia, hypertension, male sex and obesity. Current diabetic treatment includes oral agent (monotherapy). He is compliant with treatment most of the time. His weight is stable. When asked about meal planning, he reported none. He has not had a previous visit with a dietitian. He rarely participates in exercise. His breakfast blood glucose is taken between 8-9 am. His breakfast blood glucose range is generally 130-140 mg/dl. His overall blood glucose range is 130-140 mg/dl. An ACE inhibitor/angiotensin II receptor blocker is being taken. He does not see a podiatrist.Eye exam is not current.  BPH Dr. Risa Grill- every 6 months- Last PSA was 3.1    Review of Systems  Constitutional: Negative.  Negative for fatigue.  HENT: Negative.   Respiratory: Negative for shortness of breath.   Cardiovascular: Negative for chest pain and palpitations.  Endocrine: Negative for polydipsia, polyphagia and polyuria.  Neurological: Negative.  Negative for tremors and headaches.  Psychiatric/Behavioral: Negative.   All other systems reviewed and are negative.      Objective:   Physical Exam  Constitutional:  He is oriented to person, place, and time. He appears well-developed and well-nourished.  HENT:   Head: Normocephalic.  Right Ear: External ear normal.  Left Ear: External ear normal.  Nose: Nose normal.  Mouth/Throat: Oropharynx is clear and moist.  Eyes: EOM are normal. Pupils are equal, round, and reactive to light.  Neck: Normal range of motion. Neck supple. No thyromegaly present.  Cardiovascular: Normal rate, regular rhythm, normal heart sounds and intact distal pulses.   No murmur heard. Pulmonary/Chest: Effort normal and breath sounds normal. He has no wheezes. He has no rales.  Abdominal: Soft. Bowel sounds are normal.  Genitourinary:  Genitourinary Comments: DRE deferred to Dr. Risa Grill  Musculoskeletal: Normal range of motion.  Neurological: He is alert and oriented to person, place, and time.  Skin: Skin is warm and dry.  Large nodule post neck- no change  Psychiatric: He has a normal mood and affect. His behavior is normal. Judgment and thought content normal.    BP 132/83   Pulse 80   Temp 98.2 F (36.8 C) (Oral)   Ht _0  (1.854 m)   Wt 230 lb (104.3 kg)   BMI 30.34 kg/m       Assessment & Plan:  1. Hyperlipidemia Low fat diet - Lipid panel - atorvastatin (LIPITOR) 40 MG tablet; Take 1 tablet (40 mg total) by mouth daily.  Dispense: 90 tablet; Refill: 1  2. Uncontrolled type 2 diabetes mellitus with ketoacidosis without coma, without long-term current use of insulin (HCC) Continue to watch carbs in diet - Bayer DCA Hb A1c Waived- metFORMIN (GLUCOPHAGE) 500 MG tablet; Take 1 tablet (500 mg total) by mouth 2 (two) times daily with a meal.  Dispense: 180 tablet; Refill: 1   3. Essential hypertension Do not add salt o diet - CMP14+EGFR - losartan (COZAAR) 50 MG tablet; Take 1 tablet (50 mg total) by mouth daily.  Dispense: 90 tablet; Refill: 1 - hydrochlorothiazide (HYDRODIURIL) 25 MG tablet; Take 1 tablet (25 mg total) by mouth daily.  Dispense: 90 tablet; Refill: 1 - amLODipine (NORVASC) 10 MG tablet; Take 1 tablet (10 mg total) by mouth daily.   Dispense: 90 tablet; Refill: 1  4. BPH (benign prostatic hyperplasia) - finasteride (PROSCAR) 5 MG tablet; TAKE ONE TABLET BY MOUTH ONCE DAILY  Dispense: 90 tablet; Refill: 1  5. BMI 32.0-32.9,adult Discussed diet and exercise for person with BMI >25 Will recheck weight in 3-6 months      Labs pending Health maintenance reviewed Diet and exercise encouraged Continue all meds Follow up  In 3 months   [place], FNP

## 2016-04-19 NOTE — Patient Instructions (Signed)

## 2016-04-20 LAB — CMP14+EGFR
A/G RATIO: 1.5 (ref 1.2–2.2)
ALBUMIN: 4.1 g/dL (ref 3.6–4.8)
ALK PHOS: 67 IU/L (ref 39–117)
ALT: 14 IU/L (ref 0–44)
AST: 16 IU/L (ref 0–40)
BILIRUBIN TOTAL: 1.1 mg/dL (ref 0.0–1.2)
BUN / CREAT RATIO: 11 (ref 10–24)
BUN: 11 mg/dL (ref 8–27)
CHLORIDE: 97 mmol/L (ref 96–106)
CO2: 24 mmol/L (ref 18–29)
Calcium: 9.5 mg/dL (ref 8.6–10.2)
Creatinine, Ser: 1.03 mg/dL (ref 0.76–1.27)
GFR calc non Af Amer: 75 mL/min/{1.73_m2} (ref >59)
GFR, EST AFRICAN AMERICAN: 86 mL/min/{1.73_m2} (ref >59)
GLOBULIN, TOTAL: 2.8 g/dL (ref 1.5–4.5)
Glucose: 125 mg/dL — ABNORMAL HIGH (ref 65–99)
Potassium: 3.8 mmol/L (ref 3.5–5.2)
SODIUM: 136 mmol/L (ref 134–144)
Total Protein: 6.9 g/dL (ref 6.0–8.5)

## 2016-04-20 LAB — LIPID PANEL
CHOLESTEROL TOTAL: 117 mg/dL (ref 100–199)
Chol/HDL Ratio: 2.3 ratio units (ref 0.0–5.0)
HDL: 50 mg/dL (ref >39)
LDL Calculated: 56 mg/dL (ref 0–99)
Triglycerides: 56 mg/dL (ref 0–149)
VLDL Cholesterol Cal: 11 mg/dL (ref 5–40)

## 2016-07-27 ENCOUNTER — Ambulatory Visit (INDEPENDENT_AMBULATORY_CARE_PROVIDER_SITE_OTHER): Payer: BLUE CROSS/BLUE SHIELD | Admitting: Nurse Practitioner

## 2016-07-27 ENCOUNTER — Encounter: Payer: Self-pay | Admitting: Nurse Practitioner

## 2016-07-27 VITALS — BP 124/82 | HR 85 | Temp 97.1°F | Ht 73.0 in | Wt 228.0 lb

## 2016-07-27 DIAGNOSIS — E785 Hyperlipidemia, unspecified: Secondary | ICD-10-CM | POA: Diagnosis not present

## 2016-07-27 DIAGNOSIS — Z6832 Body mass index (BMI) 32.0-32.9, adult: Secondary | ICD-10-CM | POA: Diagnosis not present

## 2016-07-27 DIAGNOSIS — E119 Type 2 diabetes mellitus without complications: Secondary | ICD-10-CM

## 2016-07-27 DIAGNOSIS — I10 Essential (primary) hypertension: Secondary | ICD-10-CM

## 2016-07-27 DIAGNOSIS — N400 Enlarged prostate without lower urinary tract symptoms: Secondary | ICD-10-CM | POA: Diagnosis not present

## 2016-07-27 LAB — CMP14+EGFR
ALK PHOS: 73 IU/L (ref 39–117)
ALT: 19 IU/L (ref 0–44)
AST: 15 IU/L (ref 0–40)
Albumin/Globulin Ratio: 1.3 (ref 1.2–2.2)
Albumin: 4 g/dL (ref 3.6–4.8)
BUN/Creatinine Ratio: 10 (ref 10–24)
BUN: 11 mg/dL (ref 8–27)
Bilirubin Total: 1.1 mg/dL (ref 0.0–1.2)
CO2: 27 mmol/L (ref 18–29)
CREATININE: 1.12 mg/dL (ref 0.76–1.27)
Calcium: 9.6 mg/dL (ref 8.6–10.2)
Chloride: 99 mmol/L (ref 96–106)
GFR calc Af Amer: 78 mL/min/{1.73_m2} (ref >59)
GFR calc non Af Amer: 68 mL/min/{1.73_m2} (ref >59)
GLOBULIN, TOTAL: 3.1 g/dL (ref 1.5–4.5)
GLUCOSE: 124 mg/dL — AB (ref 65–99)
POTASSIUM: 4.5 mmol/L (ref 3.5–5.2)
SODIUM: 137 mmol/L (ref 134–144)
Total Protein: 7.1 g/dL (ref 6.0–8.5)

## 2016-07-27 LAB — LIPID PANEL
CHOLESTEROL TOTAL: 132 mg/dL (ref 100–199)
Chol/HDL Ratio: 2.6 ratio units (ref 0.0–5.0)
HDL: 50 mg/dL (ref >39)
LDL CALC: 68 mg/dL (ref 0–99)
TRIGLYCERIDES: 68 mg/dL (ref 0–149)
VLDL Cholesterol Cal: 14 mg/dL (ref 5–40)

## 2016-07-27 LAB — BAYER DCA HB A1C WAIVED: HB A1C (BAYER DCA - WAIVED): 6.8 % (ref <7.0)

## 2016-07-27 NOTE — Patient Instructions (Signed)
Cooking With Less Salt Cooking with less salt is one way to reduce the amount of sodium you get from food. Sodium raises blood pressure and causes water to be held in the body. Getting less sodium from food may help lower your blood pressure, reduce any swelling, and protect your heart, liver, and kidneys. What do I need to know about cooking with less salt?  Buy sodium-free or low-sodium products. Look on the label for the words:  Low-sodium.  Sodium-free.  Reduced-sodium.  No salt added.  Unsalted.  Check the food label before using or buying packaged ingredients.  Look for products with no more than 150 mg of sodium in one serving.  Do not choose foods with salt as one of the first three ingredients on the ingredients list. If salt is one of the first three ingredients, it usually means the item is high in sodium because ingredients are listed in order of amount in the food item.  Use herbs, seasonings without salt, and spices as substitutes for salt in foods.  Use sodium-free baking soda when baking. What are some salt alternatives? The following are herbs, seasonings, and spices that can be used instead of salt to give taste to your food. Next to their names are foods they can be used to flavor. Herbs  Bay leaves-Soups, meat and vegetable dishes, and spaghetti sauce.  Basil-Italian dishes, soups, pasta, and fish dishes.  Cilantro-Meat, poultry, and vegetable dishes.  Chili Powder-Marinades and Mexican dishes.  Chives-Salad dressings and potato dishes.  Cumin-Mexican dishes, couscous, and meat dishes.  Dill-Fish dishes, sauces, and salads.  Fennel-Meat and vegetable dishes, breads, and cookies.  Garlic (do not use garlic salt)-Italian dishes, meat dishes, salad dressings, and sauces.  Marjoram-Soups, potato dishes, and meat dishes.  Oregano-Pizza and spaghetti sauce.  Parsley-Salads, soups, pasta, and meat dishes.  Rosemary-Italian dishes, salad dressings,  soups, and red meats.  Saffron-Fish dishes, pasta, and some poultry dishes.  Sage-Stuffings and sauces.  Tarragon-Fish and [employer].  Thyme-Stuffing, meat, and fish dishes. Herbs should be fresh or dried. Do not choose packaged mixes. Seasonings  Lemon juice-Fish dishes, poultry dishes, vegetables, and salads.  Vinegar-Salad dressings, vegetables, and fish dishes. Spices  Cinnamon-Sweet dishes (such as cakes, cookies, and puddings).  Cloves-Gingerbread, puddings, and marinades for meats.  Curry-Vegetable dishes, fish and poultry dishes, and stir-fry dishes.  Ginger-Vegetables dishes, fish dishes, and stir-fry dishes.  Nutmeg-Pasta, vegetables, poultry, fish dishes, and custard. What are some low-sodium ingredients and foods?  Fresh or frozen fruits and vegetables with no sauce added.  Fresh or frozen whole meats, poultry, and fish with no sauce added.  Eggs.  Noodles, pasta, quinoa, rice.  Shredded or puffed wheat or puffed rice.  Regular or quick oats.  Milk, yogurt, hard cheeses, and low-sodium cheeses. Good cheese choices include Swiss, Clute. Always check the label for serving size and sodium content.  Unsalted butter or margarine.  Unsalted nuts.  Sherbet or ice cream (keep to  cup serving).  Homemade pudding.  Sodium-free baking soda and baking powder. This is not a complete list of low-sodium ingredients and foods. Contact your dietitian for more options.  What high-sodium ingredients are not recommended?  Sauces, such as mustard, barbecue sauce, soy sauce, teriyaki sauce, steak sauce, chili sauce, cocktail sauce, and tartar sauce.  Mixes, such as flavored rice.  Instant products, such as ready-made pasta.  Horseradish.  Salsa.  Packaged gravies.  Angie Fava.  Olives.  Sauerkraut.  Salted nuts.  Cured or  smoked meats (such as hot dogs, bacon, salami, ham, and bologna).  Processed vegetable juices, such as  tomato juice.  Buttermilk.  Processed cheeses (such as cheese dips or cheese spread).  Cottage cheese.  Instant hot cereals.  Dessert mixes (ready-to-make) and store-bought cakes and pies.  Crackers with salted tops. This is not a complete list of high-sodium ingredients. Contact your dietitian for more options.  This information is not intended to replace advice given to you by your health care provider. Make sure you discuss any questions you have with your health care provider. Document Released: 08/09/2005 Document Revised: 01/15/2016 Document Reviewed: 07/02/2013 Elsevier Interactive Patient Education  2017 [employer].

## 2016-07-27 NOTE — Progress Notes (Signed)
Subjective:    Patient ID: Jonathon Adkins, male    DOB: Jan 19, 1951, 65 y.o.   MRN: [id number]  Patient here today for follow up of chronic medical problems. NO changes since last visit. No complaints today.  Outpatient Encounter Prescriptions as of 07/27/2016  Medication Sig  . amLODipine (NORVASC) 10 MG tablet Take 1 tablet (10 mg total) by mouth daily.  Marland Kitchen atorvastatin (LIPITOR) 40 MG tablet Take 1 tablet (40 mg total) by mouth daily.  . cholecalciferol (VITAMIN D) 1000 UNITS tablet Take 1 tablet (1,000 Units total) by mouth daily.  . finasteride (PROSCAR) 5 MG tablet TAKE ONE TABLET BY MOUTH ONCE DAILY  . hydrochlorothiazide (HYDRODIURIL) 25 MG tablet Take 1 tablet (25 mg total) by mouth daily.  Marland Kitchen losartan (COZAAR) 50 MG tablet Take 1 tablet (50 mg total) by mouth daily.  . metFORMIN (GLUCOPHAGE) 500 MG tablet Take 1 tablet (500 mg total) by mouth 2 (two) times daily with a meal.   No facility-administered encounter medications on file as of 07/27/2016.      Hypertension  This is a chronic problem. The current episode started more than 1 year ago. The problem is unchanged. The problem is controlled. Pertinent negatives include no chest pain, headaches, palpitations or shortness of breath. Risk factors for coronary artery disease include dyslipidemia, diabetes mellitus and obesity. Past treatments include angiotensin blockers, calcium channel blockers and diuretics. The current treatment provides moderate improvement. Compliance problems include diet and exercise.   Hyperlipidemia  This is a chronic problem. The current episode started more than 1 year ago. The problem is controlled. Recent lipid tests were reviewed and are normal. Exacerbating diseases include diabetes and obesity. He has no history of hypothyroidism. Pertinent negatives include no chest pain or shortness of breath. Current antihyperlipidemic treatment includes statins. The current treatment provides moderate improvement of  lipids. Compliance problems include adherence to diet and adherence to exercise.  Risk factors for coronary artery disease include diabetes mellitus, dyslipidemia, hypertension and male sex.  Diabetes  He presents for his follow-up diabetic visit. He has type 2 diabetes mellitus. No MedicAlert identification noted. His disease course has been stable. Pertinent negatives for hypoglycemia include no headaches or tremors. Pertinent negatives for diabetes include no chest pain, no fatigue, no polydipsia, no polyphagia, no polyuria and no visual change. There are no hypoglycemic complications. Symptoms are stable. There are no diabetic complications. Risk factors for coronary artery disease include diabetes mellitus, dyslipidemia, hypertension, male sex and obesity. Current diabetic treatment includes oral agent (monotherapy). He is compliant with treatment most of the time. His weight is stable. When asked about meal planning, he reported none. He has not had a previous visit with a dietitian. He rarely participates in exercise. His breakfast blood glucose is taken between 8-9 am. His breakfast blood glucose range is generally 110-130 mg/dl. His highest blood glucose is 180-200 mg/dl. His overall blood glucose range is 110-130 mg/dl. An ACE inhibitor/angiotensin II receptor blocker is being taken. He does not see a podiatrist.Eye exam is not current.  BPH Dr. Risa Grill- every 6 months- Last PSA was 3.1    Review of Systems  Constitutional: Negative.  Negative for fatigue.  HENT: Negative.   Respiratory: Negative for shortness of breath.   Cardiovascular: Negative for chest pain and palpitations.  Endocrine: Negative for polydipsia, polyphagia and polyuria.  Neurological: Negative.  Negative for tremors and headaches.  Psychiatric/Behavioral: Negative.   All other systems reviewed and are negative.  Objective:   Physical Exam  Constitutional: He is oriented to person, place, and time. He appears  well-developed and well-nourished.  HENT:  Head: Normocephalic.  Right Ear: External ear normal.  Left Ear: External ear normal.  Nose: Nose normal.  Mouth/Throat: Oropharynx is clear and moist.  Eyes: EOM are normal. Pupils are equal, round, and reactive to light.  Neck: Normal range of motion. Neck supple. No thyromegaly present.  Cardiovascular: Normal rate, regular rhythm, normal heart sounds and intact distal pulses.   No murmur heard. Pulmonary/Chest: Effort normal and breath sounds normal. He has no wheezes. He has no rales.  Abdominal: Soft. Bowel sounds are normal.  Genitourinary:  Genitourinary Comments: DRE deferred to Dr. Risa Grill  Musculoskeletal: Normal range of motion.  Neurological: He is alert and oriented to person, place, and time.  Skin: Skin is warm and dry.  Large nodule post neck- no change  Psychiatric: He has a normal mood and affect. His behavior is normal. Judgment and thought content normal.   BP 124/82 (BP Location: Left Arm, Cuff Size: Large)   Pulse 85   Temp 97.1 F (36.2 C) (Oral)   Ht _0  (1.854 m)   Wt 228 lb (103.4 kg)   BMI 30.08 kg/m    Hgba1c 6.8% down from 7.1%     Assessment & Plan:  1. Hyperlipidemia with target LDL less than 100 Low fat diet - Lipid panel  2. Essential hypertension Low sodium diet - CMP14+EGFR  3. Type 2 diabetes mellitus without complication, without long-term current use of insulin (HCC) Continue to watch carbsin diet - Bayer DCA Hb A1c Waived - Microalbumin / creatinine urine ratio  4. Benign prostatic hyperplasia without lower urinary tract symptoms  5. BMI 32.0-32.9,adult Discussed diet and exercise for person with BMI >25 Will recheck weight in 3-6 months  6. Hyperlipidemia, unspecified hyperlipidemia type Low fat diet    Labs pending Health maintenance reviewed Diet and exercise encouraged Continue all meds Follow up  In 3 month   [place], FNP

## 2016-07-28 LAB — MICROALBUMIN / CREATININE URINE RATIO
CREATININE, UR: 77.7 mg/dL
MICROALB/CREAT RATIO: 6.6 mg/g{creat} (ref 0.0–30.0)
MICROALBUM., U, RANDOM: 5.1 ug/mL

## 2016-07-30 ENCOUNTER — Telehealth: Payer: Self-pay | Admitting: Nurse Practitioner

## 2016-10-20 ENCOUNTER — Other Ambulatory Visit: Payer: Self-pay | Admitting: Nurse Practitioner

## 2016-10-20 DIAGNOSIS — I10 Essential (primary) hypertension: Secondary | ICD-10-CM

## 2016-10-24 ENCOUNTER — Other Ambulatory Visit: Payer: Self-pay | Admitting: Nurse Practitioner

## 2016-10-24 DIAGNOSIS — E119 Type 2 diabetes mellitus without complications: Secondary | ICD-10-CM

## 2016-11-01 ENCOUNTER — Ambulatory Visit (INDEPENDENT_AMBULATORY_CARE_PROVIDER_SITE_OTHER): Payer: BLUE CROSS/BLUE SHIELD | Admitting: Nurse Practitioner

## 2016-11-01 ENCOUNTER — Encounter (INDEPENDENT_AMBULATORY_CARE_PROVIDER_SITE_OTHER): Payer: Self-pay

## 2016-11-01 ENCOUNTER — Encounter: Payer: Self-pay | Admitting: Nurse Practitioner

## 2016-11-01 VITALS — BP 140/85 | HR 84 | Temp 98.7°F | Ht 73.0 in | Wt 235.0 lb

## 2016-11-01 DIAGNOSIS — N400 Enlarged prostate without lower urinary tract symptoms: Secondary | ICD-10-CM

## 2016-11-01 DIAGNOSIS — E119 Type 2 diabetes mellitus without complications: Secondary | ICD-10-CM

## 2016-11-01 DIAGNOSIS — Z6832 Body mass index (BMI) 32.0-32.9, adult: Secondary | ICD-10-CM | POA: Diagnosis not present

## 2016-11-01 DIAGNOSIS — E782 Mixed hyperlipidemia: Secondary | ICD-10-CM | POA: Diagnosis not present

## 2016-11-01 DIAGNOSIS — I10 Essential (primary) hypertension: Secondary | ICD-10-CM | POA: Diagnosis not present

## 2016-11-01 LAB — BAYER DCA HB A1C WAIVED: HB A1C (BAYER DCA - WAIVED): 6.8 % (ref <7.0)

## 2016-11-01 MED ORDER — LOSARTAN POTASSIUM 50 MG PO TABS: 50.0000 mg | ORAL_TABLET | Freq: Every day | ORAL | 1 refills | Status: DC

## 2016-11-01 MED ORDER — AMLODIPINE BESYLATE 10 MG PO TABS: 10.0000 mg | ORAL_TABLET | Freq: Every day | ORAL | 1 refills | Status: DC

## 2016-11-01 MED ORDER — ATORVASTATIN CALCIUM 40 MG PO TABS: 40.0000 mg | ORAL_TABLET | Freq: Every day | ORAL | 1 refills | Status: DC

## 2016-11-01 MED ORDER — FINASTERIDE 5 MG PO TABS: ORAL_TABLET | ORAL | 1 refills | Status: DC

## 2016-11-01 MED ORDER — HYDROCHLOROTHIAZIDE 25 MG PO TABS: 25.0000 mg | ORAL_TABLET | Freq: Every day | ORAL | 1 refills | Status: DC

## 2016-11-01 MED ORDER — METFORMIN HCL 500 MG PO TABS: 500.0000 mg | ORAL_TABLET | Freq: Two times a day (BID) | ORAL | 1 refills | Status: DC

## 2016-11-01 NOTE — Progress Notes (Signed)
Subjective:    Patient ID: Jonathon Adkins, male    DOB: 01-Jul-1951, 66 y.o.   MRN: [id number]  Patient here today for follow up of chronic medical problems. NO changes since last visit. No complaints today.  Outpatient Encounter Prescriptions as of 11/01/2016  Medication Sig  . amLODipine (NORVASC) 10 MG tablet Take 1 tablet (10 mg total) by mouth daily.  Marland Kitchen atorvastatin (LIPITOR) 40 MG tablet Take 1 tablet (40 mg total) by mouth daily.  . cholecalciferol (VITAMIN D) 1000 UNITS tablet Take 1 tablet (1,000 Units total) by mouth daily.  . finasteride (PROSCAR) 5 MG tablet TAKE ONE TABLET BY MOUTH ONCE DAILY  . hydrochlorothiazide (HYDRODIURIL) 25 MG tablet Take 1 tablet (25 mg total) by mouth daily.  Marland Kitchen losartan (COZAAR) 50 MG tablet TAKE ONE TABLET BY MOUTH ONCE DAILY  . metFORMIN (GLUCOPHAGE) 500 MG tablet TAKE ONE TABLET BY MOUTH TWICE DAILY WITH MEALS   No facility-administered encounter medications on file as of 11/01/2016.      Hypertension  This is a chronic problem. The current episode started more than 1 year ago. The problem is unchanged. The problem is controlled. Pertinent negatives include no chest pain, headaches, palpitations or shortness of breath. Risk factors for coronary artery disease include dyslipidemia, diabetes mellitus and obesity. Past treatments include angiotensin blockers, calcium channel blockers and diuretics. The current treatment provides moderate improvement. Compliance problems include diet and exercise.   Hyperlipidemia  This is a chronic problem. The current episode started more than 1 year ago. The problem is controlled. Recent lipid tests were reviewed and are normal. Exacerbating diseases include diabetes and obesity. He has no history of hypothyroidism. Pertinent negatives include no chest pain or shortness of breath. Current antihyperlipidemic treatment includes statins. The current treatment provides moderate improvement of lipids. Compliance problems include  adherence to diet and adherence to exercise.  Risk factors for coronary artery disease include diabetes mellitus, dyslipidemia, hypertension and male sex.  Diabetes  He presents for his follow-up diabetic visit. He has type 2 diabetes mellitus. No MedicAlert identification noted. His disease course has been stable. Pertinent negatives for hypoglycemia include no headaches or tremors. Pertinent negatives for diabetes include no chest pain, no fatigue, no polydipsia, no polyphagia, no polyuria and no visual change. There are no hypoglycemic complications. Symptoms are stable. There are no diabetic complications. Risk factors for coronary artery disease include diabetes mellitus, dyslipidemia, hypertension, male sex and obesity. Current diabetic treatment includes oral agent (monotherapy). He is compliant with treatment most of the time. His weight is stable. When asked about meal planning, he reported none. He has not had a previous visit with a dietitian. He rarely participates in exercise. His breakfast blood glucose is taken between 8-9 am. His breakfast blood glucose range is generally 110-130 mg/dl. His highest blood glucose is 180-200 mg/dl. His overall blood glucose range is 110-130 mg/dl. An ACE inhibitor/angiotensin II receptor blocker is being taken. He does not see a podiatrist.Eye exam is not current.  BPH Dr. Risa Grill- every 6 months- Last PSA was 3.1    Review of Systems  Constitutional: Negative.  Negative for fatigue.  HENT: Negative.   Respiratory: Negative for shortness of breath.   Cardiovascular: Negative for chest pain and palpitations.  Endocrine: Negative for polydipsia, polyphagia and polyuria.  Neurological: Negative.  Negative for tremors and headaches.  Psychiatric/Behavioral: Negative.   All other systems reviewed and are negative.      Objective:   Physical Exam  Constitutional: He  is oriented to person, place, and time. He appears well-developed and well-nourished.   HENT:  Head: Normocephalic.  Right Ear: External ear normal.  Left Ear: External ear normal.  Nose: Nose normal.  Mouth/Throat: Oropharynx is clear and moist.  Eyes: EOM are normal. Pupils are equal, round, and reactive to light.  Neck: Normal range of motion. Neck supple. No thyromegaly present.  Cardiovascular: Normal rate, regular rhythm, normal heart sounds and intact distal pulses.   No murmur heard. Pulmonary/Chest: Effort normal and breath sounds normal. He has no wheezes. He has no rales.  Abdominal: Soft. Bowel sounds are normal.  Genitourinary:  Genitourinary Comments: DRE deferred to Dr. Risa Grill  Musculoskeletal: Normal range of motion.  Neurological: He is alert and oriented to person, place, and time.  Skin: Skin is warm and dry.  Large nodule post neck- no change  Psychiatric: He has a normal mood and affect. His behavior is normal. Judgment and thought content normal.   BP 140/85   Pulse 84   Temp 98.7 F (37.1 C) (Oral)   Ht _0  (1.854 m)   Wt 235 lb (106.6 kg)   BMI 31.00 kg/m        Assessment & Plan:  1. Benign prostatic hyperplasia without lower urinary tract symptoms - PSA, total and free - finasteride (PROSCAR) 5 MG tablet; TAKE ONE TABLET BY MOUTH ONCE DAILY  Dispense: 90 tablet; Refill: 1  2. Essential hypertension Dash diet - CMP14+EGFR - amLODipine (NORVASC) 10 MG tablet; Take 1 tablet (10 mg total) by mouth daily.  Dispense: 90 tablet; Refill: 1 - hydrochlorothiazide (HYDRODIURIL) 25 MG tablet; Take 1 tablet (25 mg total) by mouth daily.  Dispense: 90 tablet; Refill: 1 - losartan (COZAAR) 50 MG tablet; Take 1 tablet (50 mg total) by mouth daily.  Dispense: 90 tablet; Refill: 1  3. Type 2 diabetes mellitus without complication, without long-term current use of insulin (HCC) Continue t o watch carbs in diet - Bayer DCA Hb A1c Waived - metFORMIN (GLUCOPHAGE) 500 MG tablet; Take 1 tablet (500 mg total) by mouth 2 (two) times daily with a meal.   Dispense: 180 tablet; Refill: 1  4. BMI 32.0-32.9,adult Discussed diet and exercise for person with BMI >25 Will recheck weight in 3-6 months  5. Mixed hyperlipidemia Low fat diet - Lipid panel - atorvastatin (LIPITOR) 40 MG tablet; Take 1 tablet (40 mg total) by mouth daily.  Dispense: 90 tablet; Refill: 1    Labs pending Health maintenance reviewed Diet and exercise encouraged Continue all meds Follow up  In 3 months   [place], FNP

## 2016-11-01 NOTE — Patient Instructions (Signed)
DASH Eating Plan DASH stands for "Dietary Approaches to Stop Hypertension." The DASH eating plan is a healthy eating plan that has been shown to reduce high blood pressure (hypertension). It may also reduce your risk for type 2 diabetes, heart disease, and stroke. The DASH eating plan may also help with weight loss. What are tips for following this plan? General guidelines  Avoid eating more than 2,300 mg (milligrams) of salt (sodium) a day. If you have hypertension, you may need to reduce your sodium intake to 1,500 mg a day.  Limit alcohol intake to no more than 1 drink a day for nonpregnant women and 2 drinks a day for men. One drink equals 12 oz of beer, 5 oz of wine, or 1 oz of hard liquor.  Work with your health care provider to maintain a healthy body weight or to lose weight. Ask what an ideal weight is for you.  Get at least 30 minutes of exercise that causes your heart to beat faster (aerobic exercise) most days of the week. Activities may include walking, swimming, or biking.  Work with your health care provider or diet and nutrition specialist (dietitian) to adjust your eating plan to your individual calorie needs. Reading food labels  Check food labels for the amount of sodium per serving. Choose foods with less than 5 percent of the Daily Value of sodium. Generally, foods with less than 300 mg of sodium per serving fit into this eating plan.  To find whole grains, look for the word "whole" as the first word in the ingredient list. Shopping  Buy products labeled as "low-sodium" or "no salt added."  Buy fresh foods. Avoid canned foods and premade or frozen meals. Cooking  Avoid adding salt when cooking. Use salt-free seasonings or herbs instead of table salt or sea salt. Check with your health care provider or pharmacist before using salt substitutes.  Do not fry foods. Cook foods using healthy methods such as baking, boiling, grilling, and broiling instead.  Cook with  heart-healthy oils, such as olive, canola, soybean, or sunflower oil. Meal planning   Eat a balanced diet that includes: ? 5 or more servings of fruits and vegetables each day. At each meal, try to fill half of your plate with fruits and vegetables. ? Up to 6-8 servings of whole grains each day. ? Less than 6 oz of lean meat, poultry, or fish each day. A 3-oz serving of meat is about the same size as a deck of cards. One egg equals 1 oz. ? 2 servings of low-fat dairy each day. ? A serving of nuts, seeds, or beans 5 times each week. ? Heart-healthy fats. Healthy fats called Omega-3 fatty acids are found in foods such as flaxseeds and coldwater fish, like sardines, salmon, and mackerel.  Limit how much you eat of the following: ? Canned or prepackaged foods. ? Food that is high in trans fat, such as fried foods. ? Food that is high in saturated fat, such as fatty meat. ? Sweets, desserts, sugary drinks, and other foods with added sugar. ? Full-fat dairy products.  Do not salt foods before eating.  Try to eat at least 2 vegetarian meals each week.  Eat more home-cooked food and less restaurant, buffet, and fast food.  When eating at a restaurant, ask that your food be prepared with less salt or no salt, if possible. What foods are recommended? The items listed may not be a complete list. Talk with your dietitian about what   dietary choices are best for you. Grains Whole-grain or whole-wheat bread. Whole-grain or whole-wheat pasta. Brown rice. Oatmeal. Quinoa. Bulgur. Whole-grain and low-sodium cereals. Pita bread. Low-fat, low-sodium crackers. Whole-wheat flour tortillas. Vegetables Fresh or frozen vegetables (raw, steamed, roasted, or grilled). Low-sodium or reduced-sodium tomato and vegetable juice. Low-sodium or reduced-sodium tomato sauce and tomato paste. Low-sodium or reduced-sodium canned vegetables. Fruits All fresh, dried, or frozen fruit. Canned fruit in natural juice (without  added sugar). Meat and other protein foods Skinless chicken or turkey. Ground chicken or turkey. Pork with fat trimmed off. Fish and seafood. Egg whites. Dried beans, peas, or lentils. Unsalted nuts, nut butters, and seeds. Unsalted canned beans. Lean cuts of beef with fat trimmed off. Low-sodium, lean deli meat. Dairy Low-fat (1%) or fat-free (skim) milk. Fat-free, low-fat, or reduced-fat cheeses. Nonfat, low-sodium ricotta or cottage cheese. Low-fat or nonfat yogurt. Low-fat, low-sodium cheese. Fats and oils Soft margarine without trans fats. Vegetable oil. Low-fat, reduced-fat, or light mayonnaise and salad dressings (reduced-sodium). Canola, safflower, olive, soybean, and sunflower oils. Avocado. Seasoning and other foods Herbs. Spices. Seasoning mixes without salt. Unsalted popcorn and pretzels. Fat-free sweets. What foods are not recommended? The items listed may not be a complete list. Talk with your dietitian about what dietary choices are best for you. Grains Baked goods made with fat, such as croissants, muffins, or some breads. Dry pasta or rice meal packs. Vegetables Creamed or fried vegetables. Vegetables in a cheese sauce. Regular canned vegetables (not low-sodium or reduced-sodium). Regular canned tomato sauce and paste (not low-sodium or reduced-sodium). Regular tomato and vegetable juice (not low-sodium or reduced-sodium). Pickles. Olives. Fruits Canned fruit in a light or heavy syrup. Fried fruit. Fruit in cream or butter sauce. Meat and other protein foods Fatty cuts of meat. Ribs. Fried meat. Bacon. Sausage. Bologna and other processed lunch meats. Salami. Fatback. Hotdogs. Bratwurst. Salted nuts and seeds. Canned beans with added salt. Canned or smoked fish. Whole eggs or egg yolks. Chicken or turkey with skin. Dairy Whole or 2% milk, cream, and half-and-half. Whole or full-fat cream cheese. Whole-fat or sweetened yogurt. Full-fat cheese. Nondairy creamers. Whipped toppings.  Processed cheese and cheese spreads. Fats and oils Butter. Stick margarine. Lard. Shortening. Ghee. Bacon fat. Tropical oils, such as coconut, palm kernel, or palm oil. Seasoning and other foods Salted popcorn and pretzels. Onion salt, garlic salt, seasoned salt, table salt, and sea salt. Worcestershire sauce. Tartar sauce. Barbecue sauce. Teriyaki sauce. Soy sauce, including reduced-sodium. Steak sauce. Canned and packaged gravies. Fish sauce. Oyster sauce. Cocktail sauce. Horseradish that you find on the shelf. Ketchup. Mustard. Meat flavorings and tenderizers. Bouillon cubes. Hot sauce and Tabasco sauce. Premade or packaged marinades. Premade or packaged taco seasonings. Relishes. Regular salad dressings. Where to find more information:  National Heart, Lung, and Blood Institute: www.nhlbi.nih.gov  American Heart Association: www.heart.org Summary  The DASH eating plan is a healthy eating plan that has been shown to reduce high blood pressure (hypertension). It may also reduce your risk for type 2 diabetes, heart disease, and stroke.  With the DASH eating plan, you should limit salt (sodium) intake to 2,300 mg a day. If you have hypertension, you may need to reduce your sodium intake to 1,500 mg a day.  When on the DASH eating plan, aim to eat more fresh fruits and vegetables, whole grains, lean proteins, low-fat dairy, and heart-healthy fats.  Work with your health care provider or diet and nutrition specialist (dietitian) to adjust your eating plan to your individual   calorie needs. This information is not intended to replace advice given to you by your health care provider. Make sure you discuss any questions you have with your health care provider. Document Released: 07/29/2011 Document Revised: 08/02/2016 Document Reviewed: 08/02/2016 Elsevier Interactive Patient Education  2017 Elsevier Inc.  

## 2016-11-02 LAB — CMP14+EGFR
ALBUMIN: 4.1 g/dL (ref 3.6–4.8)
ALK PHOS: 76 IU/L (ref 39–117)
ALT: 19 IU/L (ref 0–44)
AST: 18 IU/L (ref 0–40)
Albumin/Globulin Ratio: 1.4 (ref 1.2–2.2)
BILIRUBIN TOTAL: 1.6 mg/dL — AB (ref 0.0–1.2)
BUN / CREAT RATIO: 12 (ref 10–24)
BUN: 12 mg/dL (ref 8–27)
CHLORIDE: 98 mmol/L (ref 96–106)
CO2: 24 mmol/L (ref 18–29)
Calcium: 9.4 mg/dL (ref 8.6–10.2)
Creatinine, Ser: 1.04 mg/dL (ref 0.76–1.27)
GFR calc Af Amer: 85 mL/min/{1.73_m2} (ref >59)
GFR calc non Af Amer: 74 mL/min/{1.73_m2} (ref >59)
GLOBULIN, TOTAL: 3 g/dL (ref 1.5–4.5)
GLUCOSE: 143 mg/dL — AB (ref 65–99)
Potassium: 3.8 mmol/L (ref 3.5–5.2)
SODIUM: 138 mmol/L (ref 134–144)
Total Protein: 7.1 g/dL (ref 6.0–8.5)

## 2016-11-02 LAB — LIPID PANEL
CHOLESTEROL TOTAL: 135 mg/dL (ref 100–199)
Chol/HDL Ratio: 2.7 ratio units (ref 0.0–5.0)
HDL: 50 mg/dL (ref >39)
LDL Calculated: 72 mg/dL (ref 0–99)
TRIGLYCERIDES: 64 mg/dL (ref 0–149)
VLDL Cholesterol Cal: 13 mg/dL (ref 5–40)

## 2016-11-02 LAB — PSA, TOTAL AND FREE
PSA, Free Pct: 27.7 %
PSA, Free: 0.83 ng/mL
Prostate Specific Ag, Serum: 3 ng/mL (ref 0.0–4.0)

## 2017-01-21 ENCOUNTER — Encounter (INDEPENDENT_AMBULATORY_CARE_PROVIDER_SITE_OTHER): Payer: Self-pay | Admitting: *Deleted

## 2017-02-17 ENCOUNTER — Encounter: Payer: Self-pay | Admitting: Nurse Practitioner

## 2017-02-17 ENCOUNTER — Ambulatory Visit (INDEPENDENT_AMBULATORY_CARE_PROVIDER_SITE_OTHER): Payer: BLUE CROSS/BLUE SHIELD | Admitting: Nurse Practitioner

## 2017-02-17 VITALS — BP 144/84 | HR 84 | Temp 98.6°F | Ht 73.0 in | Wt 230.0 lb

## 2017-02-17 DIAGNOSIS — Z6832 Body mass index (BMI) 32.0-32.9, adult: Secondary | ICD-10-CM

## 2017-02-17 DIAGNOSIS — E119 Type 2 diabetes mellitus without complications: Secondary | ICD-10-CM

## 2017-02-17 DIAGNOSIS — E785 Hyperlipidemia, unspecified: Secondary | ICD-10-CM

## 2017-02-17 DIAGNOSIS — I10 Essential (primary) hypertension: Secondary | ICD-10-CM | POA: Diagnosis not present

## 2017-02-17 DIAGNOSIS — E782 Mixed hyperlipidemia: Secondary | ICD-10-CM

## 2017-02-17 DIAGNOSIS — N400 Enlarged prostate without lower urinary tract symptoms: Secondary | ICD-10-CM | POA: Diagnosis not present

## 2017-02-17 LAB — BAYER DCA HB A1C WAIVED: HB A1C: 6.7 % (ref <7.0)

## 2017-02-17 MED ORDER — HYDROCHLOROTHIAZIDE 25 MG PO TABS: 25.0000 mg | ORAL_TABLET | Freq: Every day | ORAL | 1 refills | Status: DC

## 2017-02-17 MED ORDER — FINASTERIDE 5 MG PO TABS: ORAL_TABLET | ORAL | 1 refills | Status: DC

## 2017-02-17 MED ORDER — ATORVASTATIN CALCIUM 40 MG PO TABS: 40.0000 mg | ORAL_TABLET | Freq: Every day | ORAL | 1 refills | Status: DC

## 2017-02-17 MED ORDER — AMLODIPINE BESYLATE 10 MG PO TABS: 10.0000 mg | ORAL_TABLET | Freq: Every day | ORAL | 1 refills | Status: DC

## 2017-02-17 MED ORDER — LOSARTAN POTASSIUM 50 MG PO TABS: 50.0000 mg | ORAL_TABLET | Freq: Every day | ORAL | 1 refills | Status: DC

## 2017-02-17 MED ORDER — METFORMIN HCL 500 MG PO TABS: 500.0000 mg | ORAL_TABLET | Freq: Two times a day (BID) | ORAL | 1 refills | Status: DC

## 2017-02-17 NOTE — Patient Instructions (Signed)

## 2017-02-17 NOTE — Progress Notes (Signed)
Subjective:    Patient ID: Jonathon Adkins, male    DOB: 1950/10/04, 66 y.o.   MRN: [id number]  HPI   Jonathon Adkins is here today for follow up of chronic medical problem.  Outpatient Encounter Prescriptions as of 02/17/2017  Medication Sig  . amLODipine (NORVASC) 10 MG tablet Take 1 tablet (10 mg total) by mouth daily.  Marland Kitchen atorvastatin (LIPITOR) 40 MG tablet Take 1 tablet (40 mg total) by mouth daily.  . cholecalciferol (VITAMIN D) 1000 UNITS tablet Take 1 tablet (1,000 Units total) by mouth daily.  . finasteride (PROSCAR) 5 MG tablet TAKE ONE TABLET BY MOUTH ONCE DAILY  . hydrochlorothiazide (HYDRODIURIL) 25 MG tablet Take 1 tablet (25 mg total) by mouth daily.  Marland Kitchen losartan (COZAAR) 50 MG tablet Take 1 tablet (50 mg total) by mouth daily.  . metFORMIN (GLUCOPHAGE) 500 MG tablet Take 1 tablet (500 mg total) by mouth 2 (two) times daily with a meal.   No facility-administered encounter medications on file as of 02/17/2017.     1. Essential hypertension  No c/o chest pain,SOB or ha- does not check blood pressure at home  2. Type 2 diabetes mellitus without complication, without long-term current use of insulin (HCC) last HGBA1c was 6.8%. Does not check blood sugars everyday. No c/o hypoglycemia  3. Hyperlipidemia, unspecified hyperlipidemia type  Does not watch diet  4. Benign prostatic hyperplasia without lower urinary tract symptoms  Currently has no problems voididn  5. BMI 32.0-32.9,adult  No recent changes in weight       New complaints: None today    Review of Systems  Constitutional: Negative for activity change and appetite change.  HENT: Negative.   Eyes: Negative for pain.  Respiratory: Negative for shortness of breath.   Cardiovascular: Negative for chest pain, palpitations and leg swelling.  Gastrointestinal: Negative for abdominal pain.  Endocrine: Negative for polydipsia.  Genitourinary: Negative.   Skin: Negative for rash.  Neurological: Negative for dizziness,  weakness and headaches.  Hematological: Does not bruise/bleed easily.  Psychiatric/Behavioral: Negative.   All other systems reviewed and are negative.      Objective:   Physical Exam  Constitutional: He is oriented to person, place, and time. He appears well-developed and well-nourished.  HENT:  Head: Normocephalic.  Right Ear: External ear normal.  Left Ear: External ear normal.  Nose: Nose normal.  Mouth/Throat: Oropharynx is clear and moist.  Eyes: EOM are normal. Pupils are equal, round, and reactive to light.  Neck: Normal range of motion. Neck supple. No JVD present. No thyromegaly present.  Cardiovascular: Normal rate, regular rhythm, normal heart sounds and intact distal pulses.  Exam reveals no gallop and no friction rub.   No murmur heard. Pulmonary/Chest: Effort normal and breath sounds normal. No respiratory distress. He has no wheezes. He has no rales. He exhibits no tenderness.  Abdominal: Soft. Bowel sounds are normal. He exhibits no mass. There is no tenderness.  Genitourinary: Prostate normal and penis normal.  Musculoskeletal: Normal range of motion. He exhibits no edema.  Lymphadenopathy:    He has no cervical adenopathy.  Neurological: He is alert and oriented to person, place, and time. No cranial nerve deficit.  Skin: Skin is warm and dry.  Psychiatric: He has a normal mood and affect. His behavior is normal. Judgment and thought content normal.    BP (!) 144/84   Pulse 84   Temp 98.6 F (37 C) (Oral)   Ht _0  (1.854 m)  Wt 230 lb (104.3 kg)   SpO2 98%   BMI 30.34 kg/m   hgba1c 6.7%     Assessment & Plan:  1. Essential hypertension Low sodium diet - CMP14+EGFR - losartan (COZAAR) 50 MG tablet; Take 1 tablet (50 mg total) by mouth daily.  Dispense: 90 tablet; Refill: 1 - hydrochlorothiazide (HYDRODIURIL) 25 MG tablet; Take 1 tablet (25 mg total) by mouth daily.  Dispense: 90 tablet; Refill: 1 - amLODipine (NORVASC) 10 MG tablet; Take 1 tablet  (10 mg total) by mouth daily.  Dispense: 90 tablet; Refill: 1  2. Type 2 diabetes mellitus without complication, without long-term current use of insulin (HCC) Continue  To watch carbs in diet - Bayer DCA Hb A1c Waived - metFORMIN (GLUCOPHAGE) 500 MG tablet; Take 1 tablet (500 mg total) by mouth 2 (two) times daily with a meal.  Dispense: 180 tablet; Refill: 1  3. Mixed hyperlipidemia Low fat diet - atorvastatin (LIPITOR) 40 MG tablet; Take 1 tablet (40 mg total) by mouth daily.  Dispense: 90 tablet; Refill: 1 Low fat diet - Lipid panel  4. Benign prostatic hyperplasia without lower urinary tract symptoms - finasteride (PROSCAR) 5 MG tablet; TAKE ONE TABLET BY MOUTH ONCE DAILY  Dispense: 90 tablet; Refill: 1  5. BMI 32.0-32.9,adult Discussed diet and exercise for person with BMI >25 Will recheck weight in 3-6 months      Labs pending Health maintenance reviewed Diet and exercise encouraged Continue all meds Follow up  In 3 months   [place], FNP

## 2017-02-18 LAB — CMP14+EGFR
ALK PHOS: 72 IU/L (ref 39–117)
ALT: 17 IU/L (ref 0–44)
AST: 18 IU/L (ref 0–40)
Albumin/Globulin Ratio: 1.3 (ref 1.2–2.2)
Albumin: 4 g/dL (ref 3.6–4.8)
BILIRUBIN TOTAL: 1 mg/dL (ref 0.0–1.2)
BUN/Creatinine Ratio: 13 (ref 10–24)
BUN: 14 mg/dL (ref 8–27)
CHLORIDE: 100 mmol/L (ref 96–106)
CO2: 23 mmol/L (ref 20–29)
CREATININE: 1.09 mg/dL (ref 0.76–1.27)
Calcium: 9.9 mg/dL (ref 8.6–10.2)
GFR calc Af Amer: 81 mL/min/{1.73_m2} (ref >59)
GFR calc non Af Amer: 70 mL/min/{1.73_m2} (ref >59)
GLUCOSE: 147 mg/dL — AB (ref 65–99)
Globulin, Total: 3.1 g/dL (ref 1.5–4.5)
Potassium: 4 mmol/L (ref 3.5–5.2)
Sodium: 138 mmol/L (ref 134–144)
Total Protein: 7.1 g/dL (ref 6.0–8.5)

## 2017-02-18 LAB — LIPID PANEL
CHOLESTEROL TOTAL: 124 mg/dL (ref 100–199)
Chol/HDL Ratio: 2.5 ratio (ref 0.0–5.0)
HDL: 49 mg/dL (ref >39)
LDL Calculated: 62 mg/dL (ref 0–99)
TRIGLYCERIDES: 64 mg/dL (ref 0–149)
VLDL Cholesterol Cal: 13 mg/dL (ref 5–40)

## 2017-03-31 ENCOUNTER — Encounter: Payer: Self-pay | Admitting: *Deleted

## 2017-05-17 ENCOUNTER — Ambulatory Visit (INDEPENDENT_AMBULATORY_CARE_PROVIDER_SITE_OTHER): Payer: BLUE CROSS/BLUE SHIELD | Admitting: Nurse Practitioner

## 2017-05-17 ENCOUNTER — Encounter: Payer: Self-pay | Admitting: Nurse Practitioner

## 2017-05-17 VITALS — BP 135/85 | HR 78 | Temp 97.0°F | Ht 73.0 in | Wt 233.0 lb

## 2017-05-17 DIAGNOSIS — N400 Enlarged prostate without lower urinary tract symptoms: Secondary | ICD-10-CM | POA: Diagnosis not present

## 2017-05-17 DIAGNOSIS — Z6832 Body mass index (BMI) 32.0-32.9, adult: Secondary | ICD-10-CM

## 2017-05-17 DIAGNOSIS — E119 Type 2 diabetes mellitus without complications: Secondary | ICD-10-CM

## 2017-05-17 DIAGNOSIS — I10 Essential (primary) hypertension: Secondary | ICD-10-CM

## 2017-05-17 DIAGNOSIS — E782 Mixed hyperlipidemia: Secondary | ICD-10-CM | POA: Diagnosis not present

## 2017-05-17 LAB — BAYER DCA HB A1C WAIVED: HB A1C (BAYER DCA - WAIVED): 7.3 % — ABNORMAL HIGH (ref <7.0)

## 2017-05-17 NOTE — Progress Notes (Signed)
Subjective:    Patient ID: Jonathon Adkins, male    DOB: 1950/10/20, 66 y.o.   MRN: [id number]  HPI Jonathon Adkins is here today for follow up of chronic medical problem.  Outpatient Encounter Prescriptions as of 05/17/2017  Medication Sig  . amLODipine (NORVASC) 10 MG tablet Take 1 tablet (10 mg total) by mouth daily.  Marland Kitchen atorvastatin (LIPITOR) 40 MG tablet Take 1 tablet (40 mg total) by mouth daily.  . cholecalciferol (VITAMIN D) 1000 UNITS tablet Take 1 tablet (1,000 Units total) by mouth daily.  . finasteride (PROSCAR) 5 MG tablet TAKE ONE TABLET BY MOUTH ONCE DAILY  . hydrochlorothiazide (HYDRODIURIL) 25 MG tablet Take 1 tablet (25 mg total) by mouth daily.  Marland Kitchen losartan (COZAAR) 50 MG tablet Take 1 tablet (50 mg total) by mouth daily.  . metFORMIN (GLUCOPHAGE) 500 MG tablet Take 1 tablet (500 mg total) by mouth 2 (two) times daily with a meal.   No facility-administered encounter medications on file as of 05/17/2017.     1. Essential hypertension  Blood pressure controlled with amlodipine, HCTZ, and losartan.  Patient does not check BP at home.  2. Type 2 diabetes mellitus without complication, without long-term current use of insulin ([place]) Blood glucose managed with metformin.  Previous A1C was 6.7 on 02/17/17.  Patient does not check blood glucose at home.  3. Benign prostatic hyperplasia without lower urinary tract symptoms  Patient manages with Proscar.  No new complaints.  4. Mixed hyperlipidemia  Managed atorvastatin.  LFTs monitored regularly.  5. BMI 32.0-32.9,adult  No significant weight gain or loss.    New complaints: None today.  Social history: Daughter is Educational psychologist for graduate school in [place], [place].     Review of Systems  Constitutional: Negative for activity change, appetite change and fatigue.  Respiratory: Negative for cough and shortness of breath.   Cardiovascular: Negative for chest pain and palpitations.  Musculoskeletal: Negative for arthralgias and  myalgias.  Neurological: Negative for dizziness, light-headedness and headaches.  All other systems reviewed and are negative.      Objective:   Physical Exam  Constitutional: He is oriented to person, place, and time. He appears well-developed and well-nourished. No distress.  HENT:  Head: Normocephalic.  Right Ear: External ear normal.  Left Ear: External ear normal.  Mouth/Throat: Oropharynx is clear and moist.  Eyes: Pupils are equal, round, and reactive to light.  Neck: Normal range of motion. Neck supple. No thyromegaly present.  Cardiovascular: Normal rate, regular rhythm, normal heart sounds and intact distal pulses.   Pulmonary/Chest: Effort normal and breath sounds normal. No respiratory distress. He has no wheezes.  Abdominal: Soft. Bowel sounds are normal. He exhibits no distension. There is no tenderness.  Musculoskeletal: Normal range of motion. He exhibits no edema.  Lymphadenopathy:    He has no cervical adenopathy.  Neurological: He is alert and oriented to person, place, and time.  Skin: Skin is warm and dry.  Psychiatric: He has a normal mood and affect. His behavior is normal.   BP 135/85   Pulse 78   Temp (!) 97 F (36.1 C) (Oral)   Ht '6\' 1"'$  (1.854 m)   Wt 233 lb (105.7 kg)   BMI 30.74 kg/m  A1C: 7.3%    Assessment & Plan:  1. Essential hypertension Low sodium diet - CMP14+EGFR  2. Type 2 diabetes mellitus without complication, without long-term current use of insulin ([place]) Continue to watch carbs in diet - [employer]  DCA Hb A1c Waived  3. Benign prostatic hyperplasia without lower urinary tract symptoms  4. Mixed hyperlipidemia Low fat diet - Lipid panel  5. BMI 32.0-32.9,adult Discussed diet and exercise for person with BMI >25 Will recheck weight in 3-6 months    Labs pending Health maintenance reviewed Diet and exercise encouraged Continue all meds Follow up  In 3 month   [place], FNP

## 2017-05-17 NOTE — Patient Instructions (Signed)
DASH Eating Plan DASH stands for "Dietary Approaches to Stop Hypertension." The DASH eating plan is a healthy eating plan that has been shown to reduce high blood pressure (hypertension). It may also reduce your risk for type 2 diabetes, heart disease, and stroke. The DASH eating plan may also help with weight loss. What are tips for following this plan? General guidelines  Avoid eating more than 2,300 mg (milligrams) of salt (sodium) a day. If you have hypertension, you may need to reduce your sodium intake to 1,500 mg a day.  Limit alcohol intake to no more than 1 drink a day for nonpregnant women and 2 drinks a day for men. One drink equals 12 oz of beer, 5 oz of wine, or 1 oz of hard liquor.  Work with your health care provider to maintain a healthy body weight or to lose weight. Ask what an ideal weight is for you.  Get at least 30 minutes of exercise that causes your heart to beat faster (aerobic exercise) most days of the week. Activities may include walking, swimming, or biking.  Work with your health care provider or diet and nutrition specialist (dietitian) to adjust your eating plan to your individual calorie needs. Reading food labels  Check food labels for the amount of sodium per serving. Choose foods with less than 5 percent of the Daily Value of sodium. Generally, foods with less than 300 mg of sodium per serving fit into this eating plan.  To find whole grains, look for the word "whole" as the first word in the ingredient list. Shopping  Buy products labeled as "low-sodium" or "no salt added."  Buy fresh foods. Avoid canned foods and premade or frozen meals. Cooking  Avoid adding salt when cooking. Use salt-free seasonings or herbs instead of table salt or sea salt. Check with your health care provider or pharmacist before using salt substitutes.  Do not fry foods. Cook foods using healthy methods such as baking, boiling, grilling, and broiling instead.  Cook with  heart-healthy oils, such as olive, canola, soybean, or sunflower oil. Meal planning   Eat a balanced diet that includes: ? 5 or more servings of fruits and vegetables each day. At each meal, try to fill half of your plate with fruits and vegetables. ? Up to 6-8 servings of whole grains each day. ? Less than 6 oz of lean meat, poultry, or fish each day. A 3-oz serving of meat is about the same size as a deck of cards. One egg equals 1 oz. ? 2 servings of low-fat dairy each day. ? A serving of nuts, seeds, or beans 5 times each week. ? Heart-healthy fats. Healthy fats called Omega-3 fatty acids are found in foods such as flaxseeds and coldwater fish, like sardines, salmon, and mackerel.  Limit how much you eat of the following: ? Canned or prepackaged foods. ? Food that is high in trans fat, such as fried foods. ? Food that is high in saturated fat, such as fatty meat. ? Sweets, desserts, sugary drinks, and other foods with added sugar. ? Full-fat dairy products.  Do not salt foods before eating.  Try to eat at least 2 vegetarian meals each week.  Eat more home-cooked food and less restaurant, buffet, and fast food.  When eating at a restaurant, ask that your food be prepared with less salt or no salt, if possible. What foods are recommended? The items listed may not be a complete list. Talk with your dietitian about what   dietary choices are best for you. Grains Whole-grain or whole-wheat bread. Whole-grain or whole-wheat pasta. Brown rice. Oatmeal. Quinoa. Bulgur. Whole-grain and low-sodium cereals. Pita bread. Low-fat, low-sodium crackers. Whole-wheat flour tortillas. Vegetables Fresh or frozen vegetables (raw, steamed, roasted, or grilled). Low-sodium or reduced-sodium tomato and vegetable juice. Low-sodium or reduced-sodium tomato sauce and tomato paste. Low-sodium or reduced-sodium canned vegetables. Fruits All fresh, dried, or frozen fruit. Canned fruit in natural juice (without  added sugar). Meat and other protein foods Skinless chicken or turkey. Ground chicken or turkey. Pork with fat trimmed off. Fish and seafood. Egg whites. Dried beans, peas, or lentils. Unsalted nuts, nut butters, and seeds. Unsalted canned beans. Lean cuts of beef with fat trimmed off. Low-sodium, lean deli meat. Dairy Low-fat (1%) or fat-free (skim) milk. Fat-free, low-fat, or reduced-fat cheeses. Nonfat, low-sodium ricotta or cottage cheese. Low-fat or nonfat yogurt. Low-fat, low-sodium cheese. Fats and oils Soft margarine without trans fats. Vegetable oil. Low-fat, reduced-fat, or light mayonnaise and salad dressings (reduced-sodium). Canola, safflower, olive, soybean, and sunflower oils. Avocado. Seasoning and other foods Herbs. Spices. Seasoning mixes without salt. Unsalted popcorn and pretzels. Fat-free sweets. What foods are not recommended? The items listed may not be a complete list. Talk with your dietitian about what dietary choices are best for you. Grains Baked goods made with fat, such as croissants, muffins, or some breads. Dry pasta or rice meal packs. Vegetables Creamed or fried vegetables. Vegetables in a cheese sauce. Regular canned vegetables (not low-sodium or reduced-sodium). Regular canned tomato sauce and paste (not low-sodium or reduced-sodium). Regular tomato and vegetable juice (not low-sodium or reduced-sodium). Pickles. Olives. Fruits Canned fruit in a light or heavy syrup. Fried fruit. Fruit in cream or butter sauce. Meat and other protein foods Fatty cuts of meat. Ribs. Fried meat. Bacon. Sausage. Bologna and other processed lunch meats. Salami. Fatback. Hotdogs. Bratwurst. Salted nuts and seeds. Canned beans with added salt. Canned or smoked fish. Whole eggs or egg yolks. Chicken or turkey with skin. Dairy Whole or 2% milk, cream, and half-and-half. Whole or full-fat cream cheese. Whole-fat or sweetened yogurt. Full-fat cheese. Nondairy creamers. Whipped toppings.  Processed cheese and cheese spreads. Fats and oils Butter. Stick margarine. Lard. Shortening. Ghee. Bacon fat. Tropical oils, such as coconut, palm kernel, or palm oil. Seasoning and other foods Salted popcorn and pretzels. Onion salt, garlic salt, seasoned salt, table salt, and sea salt. Worcestershire sauce. Tartar sauce. Barbecue sauce. Teriyaki sauce. Soy sauce, including reduced-sodium. Steak sauce. Canned and packaged gravies. Fish sauce. Oyster sauce. Cocktail sauce. Horseradish that you find on the shelf. Ketchup. Mustard. Meat flavorings and tenderizers. Bouillon cubes. Hot sauce and Tabasco sauce. Premade or packaged marinades. Premade or packaged taco seasonings. Relishes. Regular salad dressings. Where to find more information:  National Heart, Lung, and Blood Institute: www.nhlbi.nih.gov  American Heart Association: www.heart.org Summary  The DASH eating plan is a healthy eating plan that has been shown to reduce high blood pressure (hypertension). It may also reduce your risk for type 2 diabetes, heart disease, and stroke.  With the DASH eating plan, you should limit salt (sodium) intake to 2,300 mg a day. If you have hypertension, you may need to reduce your sodium intake to 1,500 mg a day.  When on the DASH eating plan, aim to eat more fresh fruits and vegetables, whole grains, lean proteins, low-fat dairy, and heart-healthy fats.  Work with your health care provider or diet and nutrition specialist (dietitian) to adjust your eating plan to your individual   calorie needs. This information is not intended to replace advice given to you by your health care provider. Make sure you discuss any questions you have with your health care provider. Document Released: 07/29/2011 Document Revised: 08/02/2016 Document Reviewed: 08/02/2016 Elsevier Interactive Patient Education  2017 Elsevier Inc.  

## 2017-05-18 LAB — LIPID PANEL
CHOLESTEROL TOTAL: 120 mg/dL (ref 100–199)
Chol/HDL Ratio: 2.6 ratio (ref 0.0–5.0)
HDL: 47 mg/dL (ref >39)
LDL CALC: 60 mg/dL (ref 0–99)
TRIGLYCERIDES: 67 mg/dL (ref 0–149)
VLDL CHOLESTEROL CAL: 13 mg/dL (ref 5–40)

## 2017-05-18 LAB — CMP14+EGFR
ALK PHOS: 75 IU/L (ref 39–117)
ALT: 13 IU/L (ref 0–44)
AST: 15 IU/L (ref 0–40)
Albumin/Globulin Ratio: 1.4 (ref 1.2–2.2)
Albumin: 4.2 g/dL (ref 3.6–4.8)
BUN/Creatinine Ratio: 13 (ref 10–24)
BUN: 14 mg/dL (ref 8–27)
Bilirubin Total: 1.2 mg/dL (ref 0.0–1.2)
CHLORIDE: 101 mmol/L (ref 96–106)
CO2: 24 mmol/L (ref 20–29)
CREATININE: 1.1 mg/dL (ref 0.76–1.27)
Calcium: 9.7 mg/dL (ref 8.6–10.2)
GFR calc Af Amer: 79 mL/min/{1.73_m2} (ref >59)
GFR calc non Af Amer: 69 mL/min/{1.73_m2} (ref >59)
GLUCOSE: 146 mg/dL — AB (ref 65–99)
Globulin, Total: 2.9 g/dL (ref 1.5–4.5)
Potassium: 4.4 mmol/L (ref 3.5–5.2)
SODIUM: 139 mmol/L (ref 134–144)
Total Protein: 7.1 g/dL (ref 6.0–8.5)

## 2017-05-20 ENCOUNTER — Ambulatory Visit: Payer: BLUE CROSS/BLUE SHIELD | Admitting: Nurse Practitioner

## 2017-08-19 ENCOUNTER — Ambulatory Visit: Payer: BLUE CROSS/BLUE SHIELD | Admitting: Nurse Practitioner

## 2017-08-19 ENCOUNTER — Encounter: Payer: Self-pay | Admitting: Nurse Practitioner

## 2017-08-19 VITALS — BP 142/86 | HR 84 | Temp 98.3°F | Ht 73.0 in | Wt 228.0 lb

## 2017-08-19 DIAGNOSIS — I10 Essential (primary) hypertension: Secondary | ICD-10-CM

## 2017-08-19 DIAGNOSIS — E782 Mixed hyperlipidemia: Secondary | ICD-10-CM | POA: Diagnosis not present

## 2017-08-19 DIAGNOSIS — N3000 Acute cystitis without hematuria: Secondary | ICD-10-CM | POA: Diagnosis not present

## 2017-08-19 DIAGNOSIS — R300 Dysuria: Secondary | ICD-10-CM

## 2017-08-19 DIAGNOSIS — Z6832 Body mass index (BMI) 32.0-32.9, adult: Secondary | ICD-10-CM | POA: Diagnosis not present

## 2017-08-19 DIAGNOSIS — E119 Type 2 diabetes mellitus without complications: Secondary | ICD-10-CM

## 2017-08-19 DIAGNOSIS — N400 Enlarged prostate without lower urinary tract symptoms: Secondary | ICD-10-CM | POA: Diagnosis not present

## 2017-08-19 LAB — CMP14+EGFR
ALT: 15 IU/L (ref 0–44)
AST: 13 IU/L (ref 0–40)
Albumin/Globulin Ratio: 1.3 (ref 1.2–2.2)
Albumin: 4 g/dL (ref 3.6–4.8)
Alkaline Phosphatase: 91 IU/L (ref 39–117)
BUN/Creatinine Ratio: 13 (ref 10–24)
BUN: 14 mg/dL (ref 8–27)
Bilirubin Total: 0.9 mg/dL (ref 0.0–1.2)
CALCIUM: 9.4 mg/dL (ref 8.6–10.2)
CO2: 25 mmol/L (ref 20–29)
CREATININE: 1.07 mg/dL (ref 0.76–1.27)
Chloride: 101 mmol/L (ref 96–106)
GFR, EST AFRICAN AMERICAN: 82 mL/min/{1.73_m2} (ref >59)
GFR, EST NON AFRICAN AMERICAN: 71 mL/min/{1.73_m2} (ref >59)
GLOBULIN, TOTAL: 3 g/dL (ref 1.5–4.5)
Glucose: 150 mg/dL — ABNORMAL HIGH (ref 65–99)
Potassium: 3.5 mmol/L (ref 3.5–5.2)
Sodium: 140 mmol/L (ref 134–144)
TOTAL PROTEIN: 7 g/dL (ref 6.0–8.5)

## 2017-08-19 LAB — URINALYSIS, COMPLETE
BILIRUBIN UA: NEGATIVE
Glucose, UA: NEGATIVE
KETONES UA: NEGATIVE
Nitrite, UA: NEGATIVE
SPEC GRAV UA: 1.015 (ref 1.005–1.030)
Urobilinogen, Ur: 0.2 mg/dL (ref 0.2–1.0)
pH, UA: 5 (ref 5.0–7.5)

## 2017-08-19 LAB — LIPID PANEL
CHOL/HDL RATIO: 2.7 ratio (ref 0.0–5.0)
Cholesterol, Total: 128 mg/dL (ref 100–199)
HDL: 48 mg/dL (ref >39)
LDL CALC: 65 mg/dL (ref 0–99)
TRIGLYCERIDES: 73 mg/dL (ref 0–149)
VLDL CHOLESTEROL CAL: 15 mg/dL (ref 5–40)

## 2017-08-19 LAB — BAYER DCA HB A1C WAIVED: HB A1C: 7.4 % — AB (ref <7.0)

## 2017-08-19 LAB — MICROSCOPIC EXAMINATION: WBC, UA: >30 /hpf — AB (ref 0–?)

## 2017-08-19 MED ORDER — FINASTERIDE 5 MG PO TABS: ORAL_TABLET | ORAL | 1 refills | Status: DC

## 2017-08-19 MED ORDER — LOSARTAN POTASSIUM 50 MG PO TABS: 50.0000 mg | ORAL_TABLET | Freq: Every day | ORAL | 1 refills | Status: DC

## 2017-08-19 MED ORDER — ATORVASTATIN CALCIUM 40 MG PO TABS: 40.0000 mg | ORAL_TABLET | Freq: Every day | ORAL | 1 refills | Status: DC

## 2017-08-19 MED ORDER — CIPROFLOXACIN HCL 500 MG PO TABS: 500.0000 mg | ORAL_TABLET | Freq: Two times a day (BID) | ORAL | 0 refills | Status: DC

## 2017-08-19 MED ORDER — AMLODIPINE BESYLATE 10 MG PO TABS: 10.0000 mg | ORAL_TABLET | Freq: Every day | ORAL | 1 refills | Status: DC

## 2017-08-19 MED ORDER — HYDROCHLOROTHIAZIDE 25 MG PO TABS: 25.0000 mg | ORAL_TABLET | Freq: Every day | ORAL | 1 refills | Status: DC

## 2017-08-19 MED ORDER — METFORMIN HCL 500 MG PO TABS: 500.0000 mg | ORAL_TABLET | Freq: Two times a day (BID) | ORAL | 1 refills | Status: DC

## 2017-08-19 NOTE — Patient Instructions (Signed)

## 2017-08-19 NOTE — Progress Notes (Signed)
Subjective:    Patient ID: Jonathon Adkins, male    DOB: [birth year], 66 y.o.   MRN: [id number]  HPI  GERVASE COLBERG is here today for follow up of chronic medical problem.  Outpatient Encounter Medications as of 08/19/2017  Medication Sig  . amLODipine (NORVASC) 10 MG tablet Take 1 tablet (10 mg total) by mouth daily.  Marland Kitchen atorvastatin (LIPITOR) 40 MG tablet Take 1 tablet (40 mg total) by mouth daily.  . cholecalciferol (VITAMIN D) 1000 UNITS tablet Take 1 tablet (1,000 Units total) by mouth daily.  . finasteride (PROSCAR) 5 MG tablet TAKE ONE TABLET BY MOUTH ONCE DAILY  . hydrochlorothiazide (HYDRODIURIL) 25 MG tablet Take 1 tablet (25 mg total) by mouth daily.  Marland Kitchen losartan (COZAAR) 50 MG tablet Take 1 tablet (50 mg total) by mouth daily.  . metFORMIN (GLUCOPHAGE) 500 MG tablet Take 1 tablet (500 mg total) by mouth 2 (two) times daily with a meal.     1. Essential hypertension  No c/o chest pain, SOB or headache. Does not check blood pressure at home. BP Readings from Last 3 Encounters:  05/17/17 135/85  02/17/17 (!) 144/84  11/01/16 140/85     2. Type 2 diabetes mellitus without complication, without long-term current use of insulin ([facility]) last hgba1c was 68%. Blood sugars have been running around 120-140 fasting. No hypoglycemic episodes.  3. Benign prostatic hyperplasia without lower urinary tract symptoms  Has not seen urology in several years. Is on proscar which is working well. Denies any urinary hesitancy.  4. Mixed hyperlipidemia  Tries to watch diet- very little exercise  5. BMI 32.0-32.9,adult  No recent weight changes    New complaints: C/o urinary frequency and urgency  Social history: Works at [employer]    Review of Systems  Constitutional: Negative for activity change and appetite change.  HENT: Negative.   Eyes: Negative for pain.  Respiratory: Negative for shortness of breath.   Cardiovascular: Negative for chest pain, palpitations and leg swelling.    Gastrointestinal: Negative for abdominal pain.  Endocrine: Negative for polydipsia.  Genitourinary: Negative.   Skin: Negative for rash.  Neurological: Negative for dizziness, weakness and headaches.  Hematological: Does not bruise/bleed easily.  Psychiatric/Behavioral: Negative.   All other systems reviewed and are negative.      Objective:   Physical Exam  Constitutional: He is oriented to person, place, and time. He appears well-developed and well-nourished.  HENT:  Head: Normocephalic.  Right Ear: External ear normal.  Left Ear: External ear normal.  Nose: Nose normal.  Mouth/Throat: Oropharynx is clear and moist.  Eyes: EOM are normal. Pupils are equal, round, and reactive to light.  Neck: Normal range of motion. Neck supple. No JVD present. No thyromegaly present.  Cardiovascular: Normal rate, regular rhythm, normal heart sounds and intact distal pulses. Exam reveals no gallop and no friction rub.  No murmur heard. Pulmonary/Chest: Effort normal and breath sounds normal. No respiratory distress. He has no wheezes. He has no rales. He exhibits no tenderness.  Abdominal: Soft. Bowel sounds are normal. He exhibits no mass. There is no tenderness.  Musculoskeletal: Normal range of motion. He exhibits no edema.  Lymphadenopathy:    He has no cervical adenopathy.  Neurological: He is alert and oriented to person, place, and time. No cranial nerve deficit.  Skin: Skin is warm and dry.  Psychiatric: He has a normal mood and affect. His behavior is normal. Judgment and thought content normal.   BP Marland Kitchen)  142/86   Pulse 84   Temp 98.3 F (36.8 C) (Oral)   Ht _0  (1.854 m)   Wt 228 lb (103.4 kg)   BMI 30.08 kg/m   HGBA1c 7.4%    Assessment & Plan:  1. Essential hypertension Low sodium diet - CMP14+EGFR - losartan (COZAAR) 50 MG tablet; Take 1 tablet (50 mg total) by mouth daily.  Dispense: 90 tablet; Refill: 1 - hydrochlorothiazide (HYDRODIURIL) 25 MG tablet; Take 1  tablet (25 mg total) by mouth daily.  Dispense: 90 tablet; Refill: 1 - amLODipine (NORVASC) 10 MG tablet; Take 1 tablet (10 mg total) by mouth daily.  Dispense: 90 tablet; Refill: 1  2. Type 2 diabetes mellitus without complication, without long-term current use of insulin ([facility]) Continue to watch crbsin diet - Bayer DCA Hb A1c Waived - Microalbumin / creatinine urine ratio - metFORMIN (GLUCOPHAGE) 500 MG tablet; Take 1 tablet (500 mg total) by mouth 2 (two) times daily with a meal.  Dispense: 180 tablet; Refill: 1  3. Benign prostatic hyperplasia without lower urinary tract symptoms - finasteride (PROSCAR) 5 MG tablet; TAKE ONE TABLET BY MOUTH ONCE DAILY  Dispense: 90 tablet; Refill: 1  4. Mixed hyperlipidemia Low fat diet - Lipid panel - atorvastatin (LIPITOR) 40 MG tablet; Take 1 tablet (40 mg total) by mouth daily.  Dispense: 90 tablet; Refill: 1  5. BMI 32.0-32.9,adult Discussed diet and exercise for person with BMI >25 Will recheck weight in 3-6 months  6. Dysuria - Urinalysis, Complete  7. Acute cystitis without hematuria Take medication as prescribe Cotton underwear Take shower not bath Cranberry juice, yogurt Force fluids AZO over the counter X2 days Culture pending RTO prn - ciprofloxacin (CIPRO) 500 MG tablet; Take 1 tablet (500 mg total) by mouth 2 (two) times daily.  Dispense: 14 tablet; Refill: 0 - Urine Culture    Labs pending Health maintenance reviewed Diet and exercise encouraged Continue all meds Follow up  In 3 months   [place], FNP

## 2017-08-20 LAB — MICROALBUMIN / CREATININE URINE RATIO
Creatinine, Urine: 78.7 mg/dL
Microalb/Creat Ratio: 116.3 mg/g creat — ABNORMAL HIGH (ref 0.0–30.0)
Microalbumin, Urine: 91.5 ug/mL

## 2017-08-21 LAB — URINE CULTURE

## 2017-11-21 ENCOUNTER — Encounter: Payer: Self-pay | Admitting: Nurse Practitioner

## 2017-11-21 ENCOUNTER — Ambulatory Visit: Payer: BLUE CROSS/BLUE SHIELD | Admitting: Nurse Practitioner

## 2017-11-21 VITALS — BP 138/79 | HR 77 | Temp 97.0°F | Ht 73.0 in | Wt 226.0 lb

## 2017-11-21 DIAGNOSIS — E119 Type 2 diabetes mellitus without complications: Secondary | ICD-10-CM

## 2017-11-21 DIAGNOSIS — Z6832 Body mass index (BMI) 32.0-32.9, adult: Secondary | ICD-10-CM | POA: Diagnosis not present

## 2017-11-21 DIAGNOSIS — I10 Essential (primary) hypertension: Secondary | ICD-10-CM | POA: Diagnosis not present

## 2017-11-21 DIAGNOSIS — E782 Mixed hyperlipidemia: Secondary | ICD-10-CM

## 2017-11-21 DIAGNOSIS — N400 Enlarged prostate without lower urinary tract symptoms: Secondary | ICD-10-CM

## 2017-11-21 LAB — BAYER DCA HB A1C WAIVED: HB A1C (BAYER DCA - WAIVED): 7 % — ABNORMAL HIGH (ref <7.0)

## 2017-11-21 MED ORDER — AMLODIPINE BESYLATE 10 MG PO TABS: 10.0000 mg | ORAL_TABLET | Freq: Every day | ORAL | 1 refills | Status: DC

## 2017-11-21 MED ORDER — HYDROCHLOROTHIAZIDE 25 MG PO TABS: 25.0000 mg | ORAL_TABLET | Freq: Every day | ORAL | 1 refills | Status: DC

## 2017-11-21 MED ORDER — LOSARTAN POTASSIUM 50 MG PO TABS: 50.0000 mg | ORAL_TABLET | Freq: Every day | ORAL | 1 refills | Status: DC

## 2017-11-21 MED ORDER — FINASTERIDE 5 MG PO TABS: ORAL_TABLET | ORAL | 1 refills | Status: DC

## 2017-11-21 MED ORDER — METFORMIN HCL 500 MG PO TABS: 500.0000 mg | ORAL_TABLET | Freq: Two times a day (BID) | ORAL | 1 refills | Status: DC

## 2017-11-21 MED ORDER — ATORVASTATIN CALCIUM 40 MG PO TABS: 40.0000 mg | ORAL_TABLET | Freq: Every day | ORAL | 1 refills | Status: DC

## 2017-11-21 NOTE — Progress Notes (Signed)
Subjective:    Patient ID: Jonathon Adkins, male    DOB: 1951/06/06, 67 y.o.   MRN: [id number]  HPI  KEYRON POKORSKI is here today for follow up of chronic medical problem.  Outpatient Encounter Medications as of 11/21/2017  Medication Sig  . amLODipine (NORVASC) 10 MG tablet Take 1 tablet (10 mg total) by mouth daily.  Marland Kitchen atorvastatin (LIPITOR) 40 MG tablet Take 1 tablet (40 mg total) by mouth daily.  . cholecalciferol (VITAMIN D) 1000 UNITS tablet Take 1 tablet (1,000 Units total) by mouth daily.  . finasteride (PROSCAR) 5 MG tablet TAKE ONE TABLET BY MOUTH ONCE DAILY  . hydrochlorothiazide (HYDRODIURIL) 25 MG tablet Take 1 tablet (25 mg total) by mouth daily.  Marland Kitchen losartan (COZAAR) 50 MG tablet Take 1 tablet (50 mg total) by mouth daily.  . metFORMIN (GLUCOPHAGE) 500 MG tablet Take 1 tablet (500 mg total) by mouth 2 (two) times daily with a meal.     1. Essential hypertension  No c/o chest pain, sob or headache. Does not check blood pressure at home  2. Mixed hyperlipidemia  Watches diet daily. Exercises several days a week.  3. Type 2 diabetes mellitus without complication, without long-term current use of insulin ([facility]) last hgba1c was 7.4%. He checks hi blood sugars often and it is never above 150 fasting. No hypoglycemia   4. Benign prostatic hyperplasia without lower urinary tract symptoms  denie any problems voiding. Has not seen urology in years.  5. BMI 32.0-32.9,adult  No recent weight changes.    New complaints: None today  Social history: Lives with wife of 34 years. Still works- [employer]   Review of Systems  Constitutional: Negative for activity change and appetite change.  HENT: Negative.   Eyes: Negative for pain.  Respiratory: Negative for shortness of breath.   Cardiovascular: Negative for chest pain, palpitations and leg swelling.  Gastrointestinal: Negative for abdominal pain.  Endocrine: Negative for polydipsia.  Genitourinary: Negative.   Skin: Negative for  rash.  Neurological: Negative for dizziness, weakness and headaches.  Hematological: Does not bruise/bleed easily.  Psychiatric/Behavioral: Negative.   All other systems reviewed and are negative.      Objective:   Physical Exam  Constitutional: He is oriented to person, place, and time. He appears well-developed and well-nourished.  HENT:  Head: Normocephalic.  Right Ear: External ear normal.  Left Ear: External ear normal.  Nose: Nose normal.  Mouth/Throat: Oropharynx is clear and moist.  Eyes: Pupils are equal, round, and reactive to light. EOM are normal.  Neck: Normal range of motion. Neck supple. No JVD present. No thyromegaly present.  Cardiovascular: Normal rate, regular rhythm, normal heart sounds and intact distal pulses. Exam reveals no gallop and no friction rub.  No murmur heard. Pulmonary/Chest: Effort normal and breath sounds normal. No respiratory distress. He has no wheezes. He has no rales. He exhibits no tenderness.  Abdominal: Soft. Bowel sounds are normal. He exhibits no mass. There is no tenderness.  Genitourinary: Prostate normal and penis normal.  Musculoskeletal: Normal range of motion. He exhibits no edema.  Lymphadenopathy:    He has no cervical adenopathy.  Neurological: He is alert and oriented to person, place, and time. No cranial nerve deficit.  Skin: Skin is warm and dry.  Psychiatric: He has a normal mood and affect. His behavior is normal. Judgment and thought content normal.   BP 138/79   Pulse 77   Temp (!) 97 F (36.1 C) (Oral)  Ht _0  (1.854 m)   Wt 226 lb (102.5 kg)   BMI 29.82 kg/m       Assessment & Plan:  1. Essential hypertension Low sodium diet - CMP14+EGFR - losartan (COZAAR) 50 MG tablet; Take 1 tablet (50 mg total) by mouth daily.  Dispense: 90 tablet; Refill: 1 - hydrochlorothiazide (HYDRODIURIL) 25 MG tablet; Take 1 tablet (25 mg total) by mouth daily.  Dispense: 90 tablet; Refill: 1 - amLODipine (NORVASC) 10 MG  tablet; Take 1 tablet (10 mg total) by mouth daily.  Dispense: 90 tablet; Refill: 1  2. Mixed hyperlipidemia Low fat diet - Lipid panel - atorvastatin (LIPITOR) 40 MG tablet; Take 1 tablet (40 mg total) by mouth daily.  Dispense: 90 tablet; Refill: 1  3. Type 2 diabetes mellitus without complication, without long-term current use of insulin ([facility]) Continue to watch carbs in diet - Bayer DCA Hb A1c Waived - metFORMIN (GLUCOPHAGE) 500 MG tablet; Take 1 tablet (500 mg total) by mouth 2 (two) times daily with a meal.  Dispense: 180 tablet; Refill: 1  4. Benign prostatic hyperplasia without lower urinary tract symptoms - finasteride (PROSCAR) 5 MG tablet; TAKE ONE TABLET BY MOUTH ONCE DAILY  Dispense: 90 tablet; Refill: 1  5. BMI 32.0-32.9,adult Discussed diet and exercise for person with BMI >25 Will recheck weight in 3-6 months     Labs pending Health maintenance reviewed Diet and exercise encouraged Continue all meds Follow up  In 3 months   [place], FNP

## 2017-11-21 NOTE — Patient Instructions (Signed)
Exercising to Stay Healthy Exercising regularly is important. It has many health benefits, such as:  Improving your overall fitness, flexibility, and endurance.  Increasing your bone density.  Helping with weight control.  Decreasing your body fat.  Increasing your muscle strength.  Reducing stress and tension.  Improving your overall health.  In order to become healthy and stay healthy, it is recommended that you do moderate-intensity and vigorous-intensity exercise. You can tell that you are exercising at a moderate intensity if you have a higher heart rate and faster breathing, but you are still able to hold a conversation. You can tell that you are exercising at a vigorous intensity if you are breathing much harder and faster and cannot hold a conversation while exercising. How often should I exercise? Choose an activity that you enjoy and set realistic goals. Your health care provider can help you to make an activity plan that works for you. Exercise regularly as directed by your health care provider. This may include:  Doing resistance training twice each week, such as: ? Push-ups. ? Sit-ups. ? Lifting weights. ? Using resistance bands.  Doing a given intensity of exercise for a given amount of time. Choose from these options: ? 150 minutes of moderate-intensity exercise every week. ? 75 minutes of vigorous-intensity exercise every week. ? A mix of moderate-intensity and vigorous-intensity exercise every week.  Children, pregnant women, people who are out of shape, people who are overweight, and older adults may need to consult a health care provider for individual recommendations. If you have any sort of medical condition, be sure to consult your health care provider before starting a new exercise program. What are some exercise ideas? Some moderate-intensity exercise ideas include:  Walking at a rate of 1 mile in 15  minutes.  Biking.  Hiking.  Golfing.  Dancing.  Some vigorous-intensity exercise ideas include:  Walking at a rate of at least 4.5 miles per hour.  Jogging or running at a rate of 5 miles per hour.  Biking at a rate of at least 10 miles per hour.  Lap swimming.  Roller-skating or in-line skating.  Cross-country skiing.  Vigorous competitive sports, such as football, basketball, and soccer.  Jumping rope.  Aerobic dancing.  What are some everyday activities that can help me to get exercise?  Yard work, such as: ? Pushing a lawn mower. ? Raking and bagging leaves.  Washing and waxing your car.  Pushing a stroller.  Shoveling snow.  Gardening.  Washing windows or floors. How can I be more active in my day-to-day activities?  Use the stairs instead of the elevator.  Take a walk during your lunch break.  If you drive, park your car farther away from work or school.  If you take public transportation, get off one stop early and walk the rest of the way.  Make all of your phone calls while standing up and walking around.  Get up, stretch, and walk around every 30 minutes throughout the day. What guidelines should I follow while exercising?  Do not exercise so much that you hurt yourself, feel dizzy, or get very short of breath.  Consult your health care provider before starting a new exercise program.  Wear comfortable clothes and shoes with good support.  Drink plenty of water while you exercise to prevent dehydration or heat stroke. Body water is lost during exercise and must be replaced.  Work out until you breathe faster and your heart beats faster. This information is not   intended to replace advice given to you by your health care provider. Make sure you discuss any questions you have with your health care provider. Document Released: 09/11/2010 Document Revised: 01/15/2016 Document Reviewed: 01/10/2014 Elsevier Interactive Patient Education  2018  Elsevier Inc.  

## 2017-11-22 LAB — CMP14+EGFR
A/G RATIO: 1.4 (ref 1.2–2.2)
ALBUMIN: 3.9 g/dL (ref 3.6–4.8)
ALK PHOS: 77 IU/L (ref 39–117)
ALT: 16 IU/L (ref 0–44)
AST: 16 IU/L (ref 0–40)
BILIRUBIN TOTAL: 1 mg/dL (ref 0.0–1.2)
BUN / CREAT RATIO: 15 (ref 10–24)
BUN: 16 mg/dL (ref 8–27)
CO2: 25 mmol/L (ref 20–29)
Calcium: 9.7 mg/dL (ref 8.6–10.2)
Chloride: 100 mmol/L (ref 96–106)
Creatinine, Ser: 1.08 mg/dL (ref 0.76–1.27)
GFR calc Af Amer: 81 mL/min/{1.73_m2} (ref >59)
GFR calc non Af Amer: 70 mL/min/{1.73_m2} (ref >59)
Globulin, Total: 2.8 g/dL (ref 1.5–4.5)
Glucose: 147 mg/dL — ABNORMAL HIGH (ref 65–99)
Potassium: 4 mmol/L (ref 3.5–5.2)
Sodium: 137 mmol/L (ref 134–144)
Total Protein: 6.7 g/dL (ref 6.0–8.5)

## 2017-11-22 LAB — LIPID PANEL
CHOLESTEROL TOTAL: 125 mg/dL (ref 100–199)
Chol/HDL Ratio: 2.6 ratio (ref 0.0–5.0)
HDL: 49 mg/dL (ref >39)
LDL Calculated: 63 mg/dL (ref 0–99)
Triglycerides: 66 mg/dL (ref 0–149)
VLDL Cholesterol Cal: 13 mg/dL (ref 5–40)

## 2018-03-03 ENCOUNTER — Encounter: Payer: Self-pay | Admitting: Nurse Practitioner

## 2018-03-03 ENCOUNTER — Ambulatory Visit: Payer: BLUE CROSS/BLUE SHIELD | Admitting: Nurse Practitioner

## 2018-03-03 VITALS — BP 130/82 | HR 87 | Temp 97.8°F | Ht 73.0 in | Wt 224.0 lb

## 2018-03-03 DIAGNOSIS — E782 Mixed hyperlipidemia: Secondary | ICD-10-CM

## 2018-03-03 DIAGNOSIS — N400 Enlarged prostate without lower urinary tract symptoms: Secondary | ICD-10-CM

## 2018-03-03 DIAGNOSIS — Z6832 Body mass index (BMI) 32.0-32.9, adult: Secondary | ICD-10-CM

## 2018-03-03 DIAGNOSIS — E119 Type 2 diabetes mellitus without complications: Secondary | ICD-10-CM

## 2018-03-03 DIAGNOSIS — I10 Essential (primary) hypertension: Secondary | ICD-10-CM

## 2018-03-03 LAB — CMP14+EGFR
ALK PHOS: 82 IU/L (ref 39–117)
ALT: 14 IU/L (ref 0–44)
AST: 15 IU/L (ref 0–40)
Albumin/Globulin Ratio: 1.4 (ref 1.2–2.2)
Albumin: 4.3 g/dL (ref 3.6–4.8)
BILIRUBIN TOTAL: 1.1 mg/dL (ref 0.0–1.2)
BUN/Creatinine Ratio: 13 (ref 10–24)
BUN: 15 mg/dL (ref 8–27)
CO2: 24 mmol/L (ref 20–29)
CREATININE: 1.18 mg/dL (ref 0.76–1.27)
Calcium: 10 mg/dL (ref 8.6–10.2)
Chloride: 99 mmol/L (ref 96–106)
GFR calc Af Amer: 73 mL/min/{1.73_m2} (ref >59)
GFR calc non Af Amer: 63 mL/min/{1.73_m2} (ref >59)
GLUCOSE: 151 mg/dL — AB (ref 65–99)
Globulin, Total: 3 g/dL (ref 1.5–4.5)
Potassium: 4.7 mmol/L (ref 3.5–5.2)
Sodium: 137 mmol/L (ref 134–144)
Total Protein: 7.3 g/dL (ref 6.0–8.5)

## 2018-03-03 LAB — BAYER DCA HB A1C WAIVED: HB A1C: 7.1 % — AB (ref <7.0)

## 2018-03-03 LAB — LIPID PANEL
CHOLESTEROL TOTAL: 120 mg/dL (ref 100–199)
Chol/HDL Ratio: 2.6 ratio (ref 0.0–5.0)
HDL: 47 mg/dL (ref >39)
LDL Calculated: 62 mg/dL (ref 0–99)
TRIGLYCERIDES: 55 mg/dL (ref 0–149)
VLDL CHOLESTEROL CAL: 11 mg/dL (ref 5–40)

## 2018-03-03 MED ORDER — FINASTERIDE 5 MG PO TABS: ORAL_TABLET | ORAL | 1 refills | Status: DC

## 2018-03-03 MED ORDER — ATORVASTATIN CALCIUM 40 MG PO TABS: 40.0000 mg | ORAL_TABLET | Freq: Every day | ORAL | 1 refills | Status: DC

## 2018-03-03 MED ORDER — METFORMIN HCL 500 MG PO TABS: 500.0000 mg | ORAL_TABLET | Freq: Two times a day (BID) | ORAL | 1 refills | Status: DC

## 2018-03-03 MED ORDER — AMLODIPINE BESYLATE 10 MG PO TABS
10.0000 mg | ORAL_TABLET | Freq: Every day | ORAL | 1 refills | Status: DC
Start: 2018-03-03 — End: 2018-09-18

## 2018-03-03 MED ORDER — HYDROCHLOROTHIAZIDE 25 MG PO TABS: 25.0000 mg | ORAL_TABLET | Freq: Every day | ORAL | 1 refills | Status: DC

## 2018-03-03 MED ORDER — LOSARTAN POTASSIUM 50 MG PO TABS: 50.0000 mg | ORAL_TABLET | Freq: Every day | ORAL | 1 refills | Status: DC

## 2018-03-03 NOTE — Patient Instructions (Signed)

## 2018-03-03 NOTE — Progress Notes (Signed)
Subjective:    Patient ID: Jonathon Adkins, male    DOB: [birth year], 67 y.o.   MRN: [id number]   Chief Complaint: No chief complaint on file.   HPI:  1. Essential hypertension No c/o chest pain , sob or headache. Does not check blood pressure at home. BP Readings from Last 3 Encounters:  11/21/17 138/79  08/19/17 (!) 142/86  05/17/17 135/85      2. Type 2 diabetes mellitus without complication, without long-term current use of insulin ([facility]) last hgab1c was 7.0% he does not check blood sugars everyday but are below 10 when he does check them. He denies any symptoms of hypoglycemia.  3. Benign prostatic hyperplasia without lower urinary tract symptoms  Last PSA in 2018 was 3.0. He denies any voiding problems. No urgency or trouble with stream.  4. Mixed hyperlipidemia  Does not watch diet. No dedicated exercise, but does stay active  5. BMI 32.0-32.9,adult  No recent weight changes.    Outpatient Encounter Medications as of 03/03/2018  Medication Sig  . amLODipine (NORVASC) 10 MG tablet Take 1 tablet (10 mg total) by mouth daily.  Marland Kitchen atorvastatin (LIPITOR) 40 MG tablet Take 1 tablet (40 mg total) by mouth daily.  . cholecalciferol (VITAMIN D) 1000 UNITS tablet Take 1 tablet (1,000 Units total) by mouth daily.  . finasteride (PROSCAR) 5 MG tablet TAKE ONE TABLET BY MOUTH ONCE DAILY  . hydrochlorothiazide (HYDRODIURIL) 25 MG tablet Take 1 tablet (25 mg total) by mouth daily.  Marland Kitchen losartan (COZAAR) 50 MG tablet Take 1 tablet (50 mg total) by mouth daily.  . metFORMIN (GLUCOPHAGE) 500 MG tablet Take 1 tablet (500 mg total) by mouth 2 (two) times daily with a meal.      New complaints: None today  Social history: Still working at [employer] 12 hour shifts   Review of Systems  Constitutional: Negative for activity change and appetite change.  HENT: Negative.   Eyes: Negative for pain.  Respiratory: Negative for shortness of breath.   Cardiovascular: Negative for chest pain,  palpitations and leg swelling.  Gastrointestinal: Negative for abdominal pain.  Endocrine: Negative for polydipsia.  Genitourinary: Negative.   Skin: Negative for rash.  Neurological: Negative for dizziness, weakness and headaches.  Hematological: Does not bruise/bleed easily.  Psychiatric/Behavioral: Negative.   All other systems reviewed and are negative.      Objective:   Physical Exam  Constitutional: He is oriented to person, place, and time.  HENT:  Head: Normocephalic.  Nose: Nose normal.  Mouth/Throat: Oropharynx is clear and moist.  Eyes: Pupils are equal, round, and reactive to light. EOM are normal.  Neck: Normal range of motion and phonation normal. Neck supple. No JVD present. Carotid bruit is not present. No thyroid mass and no thyromegaly present.  Cardiovascular: Normal rate and regular rhythm.  Pulmonary/Chest: Effort normal and breath sounds normal. No respiratory distress.  Abdominal: Soft. Normal appearance, normal aorta and bowel sounds are normal. There is no tenderness.  Musculoskeletal: Normal range of motion.  Lymphadenopathy:    He has no cervical adenopathy.  Neurological: He is alert and oriented to person, place, and time.  Skin: Skin is warm and dry.  Psychiatric: Judgment normal.   BP 130/82   Pulse 87   Temp 97.8 F (36.6 C) (Oral)   Ht _0  (1.854 m)   Wt 224 lb (101.6 kg)   BMI 29.55 kg/m   hgba1c 7.1%      Assessment & Plan:  Jonathon Adkins comes in today with chief complaint of Medical Management of Chronic Issues   Diagnosis and orders addressed:  1. Essential hypertension Low sodium diet - CMP14+EGFR - losartan (COZAAR) 50 MG tablet; Take 1 tablet (50 mg total) by mouth daily.  Dispense: 90 tablet; Refill: 1 - hydrochlorothiazide (HYDRODIURIL) 25 MG tablet; Take 1 tablet (25 mg total) by mouth daily.  Dispense: 90 tablet; Refill: 1 - amLODipine (NORVASC) 10 MG tablet; Take 1 tablet (10 mg total) by mouth daily.  Dispense: 90  tablet; Refill: 1  2. Type 2 diabetes mellitus without complication, without long-term current use of insulin ([facility]) Continue to watch cars=bs in diet - Bayer DCA Hb A1c Waived - metFORMIN (GLUCOPHAGE) 500 MG tablet; Take 1 tablet (500 mg total) by mouth 2 (two) times daily with a meal.  Dispense: 180 tablet; Refill: 1  3. Benign prostatic hyperplasia without lower urinary tract symptoms Keep follow up appointment with urology next week - finasteride (PROSCAR) 5 MG tablet; TAKE ONE TABLET BY MOUTH ONCE DAILY  Dispense: 90 tablet; Refill: 1  4. Mixed hyperlipidemia low fat diet - Lipid panel - atorvastatin (LIPITOR) 40 MG tablet; Take 1 tablet (40 mg total) by mouth daily.  Dispense: 90 tablet; Refill: 1  5. BMI 32.0-32.9,adult Discussed diet and exercise for person with BMI >25 Will recheck weight in 3-6 months    Labs pending Health Maintenance reviewed Diet and exercise encouraged  Follow up plan: 3 months   [name] Done, FNP

## 2018-03-06 LAB — HM DIABETES EYE EXAM

## 2018-03-11 ENCOUNTER — Encounter: Payer: Self-pay | Admitting: Family Medicine

## 2018-03-11 ENCOUNTER — Ambulatory Visit: Payer: BLUE CROSS/BLUE SHIELD | Admitting: Family Medicine

## 2018-03-11 VITALS — BP 130/72 | HR 64 | Temp 97.8°F | Ht 73.0 in | Wt 224.0 lb

## 2018-03-11 DIAGNOSIS — I10 Essential (primary) hypertension: Secondary | ICD-10-CM

## 2018-03-11 DIAGNOSIS — R350 Frequency of micturition: Secondary | ICD-10-CM

## 2018-03-11 NOTE — Progress Notes (Signed)
   HPI  Patient presents today for elevated blood pressure.  Patient states that this morning he was sweating while he was at work.  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  He works at a [employer].  The security guard checked his blood pressure without a stethoscope with a manual sphygmo and stated that his blood pressure is 180/130.  Patient states he has had mild nausea recently as well as increased urinary frequency.  If this is been going on 1 to 2 days.  He denies any fevers chills or sweats except for at work, which he expects.  Patient's been doing lots of work outside lately and hydrating aggressively.   PMH: Smoking status noted ROS: Per HPI  Objective: BP 130/72   Pulse 64   Temp 97.8 F (36.6 C) (Oral)   Ht 6\' 1"  (1.854 m)   Wt 224 lb (101.6 kg)   BMI 29.55 kg/m  Gen: NAD, alert, cooperative with exam HEENT: NCAT CV: RRR, good S1/S2, no murmur Resp: CTABL, no wheezes, non-labored Abd: No suprapubic tenderness, no CVA tenderness Ext: No edema, warm Neuro: Alert and oriented, No gross deficits  Assessment and plan:  #Hypertension Blood pressure well controlled, no changes Likely not an accurate check at work  #Urinary frequency Urinalysis does not show signs of infection, culture to be sure He does have trace glucose, fingerstick glucose was found to be 269 -likely due to osmotic diuresis from glycosuria Discussed strategies to reduce his blood sugar, offered additional medications, however he would like to engage in healthy lifestyle choices    Orders Placed This Encounter  Procedures  . Urine Culture  . Urinalysis  . Glucose Hemocue Dublin, MD [facility] 03/11/2018, 9:37 AM

## 2018-03-13 LAB — URINALYSIS
BILIRUBIN UA: NEGATIVE
Leukocytes, UA: NEGATIVE
Nitrite, UA: NEGATIVE
Specific Gravity, UA: 1.02 (ref 1.005–1.030)
UUROB: 0.2 mg/dL (ref 0.2–1.0)
pH, UA: 5 (ref 5.0–7.5)

## 2018-03-13 LAB — GLUCOSE HEMOCUE WAIVED: Glu Hemocue Waived: 269 mg/dL — ABNORMAL HIGH (ref 65–99)

## 2018-06-13 ENCOUNTER — Ambulatory Visit: Payer: Managed Care, Other (non HMO) | Admitting: Nurse Practitioner

## 2018-06-13 ENCOUNTER — Encounter: Payer: Self-pay | Admitting: Nurse Practitioner

## 2018-06-13 VITALS — BP 137/85 | HR 80 | Temp 97.6°F | Ht 73.0 in | Wt 219.0 lb

## 2018-06-13 DIAGNOSIS — N400 Enlarged prostate without lower urinary tract symptoms: Secondary | ICD-10-CM

## 2018-06-13 DIAGNOSIS — E782 Mixed hyperlipidemia: Secondary | ICD-10-CM | POA: Diagnosis not present

## 2018-06-13 DIAGNOSIS — Z6832 Body mass index (BMI) 32.0-32.9, adult: Secondary | ICD-10-CM

## 2018-06-13 DIAGNOSIS — E119 Type 2 diabetes mellitus without complications: Secondary | ICD-10-CM

## 2018-06-13 DIAGNOSIS — I10 Essential (primary) hypertension: Secondary | ICD-10-CM

## 2018-06-13 LAB — CMP14+EGFR
ALK PHOS: 78 IU/L (ref 39–117)
ALT: 13 IU/L (ref 0–44)
AST: 13 IU/L (ref 0–40)
Albumin/Globulin Ratio: 1.5 (ref 1.2–2.2)
Albumin: 4.2 g/dL (ref 3.6–4.8)
BUN/Creatinine Ratio: 13 (ref 10–24)
BUN: 14 mg/dL (ref 8–27)
Bilirubin Total: 1.1 mg/dL (ref 0.0–1.2)
CO2: 25 mmol/L (ref 20–29)
CREATININE: 1.12 mg/dL (ref 0.76–1.27)
Calcium: 9.7 mg/dL (ref 8.6–10.2)
Chloride: 102 mmol/L (ref 96–106)
GFR calc Af Amer: 77 mL/min/{1.73_m2} (ref >59)
GFR calc non Af Amer: 67 mL/min/{1.73_m2} (ref >59)
Globulin, Total: 2.8 g/dL (ref 1.5–4.5)
Glucose: 137 mg/dL — ABNORMAL HIGH (ref 65–99)
POTASSIUM: 4.2 mmol/L (ref 3.5–5.2)
SODIUM: 141 mmol/L (ref 134–144)
Total Protein: 7 g/dL (ref 6.0–8.5)

## 2018-06-13 LAB — LIPID PANEL
CHOL/HDL RATIO: 2.7 ratio (ref 0.0–5.0)
CHOLESTEROL TOTAL: 133 mg/dL (ref 100–199)
HDL: 50 mg/dL (ref >39)
LDL CALC: 70 mg/dL (ref 0–99)
TRIGLYCERIDES: 67 mg/dL (ref 0–149)
VLDL Cholesterol Cal: 13 mg/dL (ref 5–40)

## 2018-06-13 LAB — BAYER DCA HB A1C WAIVED: HB A1C (BAYER DCA - WAIVED): 6.7 % (ref <7.0)

## 2018-06-13 NOTE — Patient Instructions (Signed)

## 2018-06-13 NOTE — Progress Notes (Signed)
Subjective:    Patient ID: Jonathon Adkins, male    DOB: [birth year], 67 y.o.   MRN: [id number]   Chief Complaint: Follow up for medical management of chronic issues  HPI:  1. Essential hypertension  -does not check BP at home -no C/O of CP/SOB/HA -does not watch salt intake in diet BP Readings from Last 3 Encounters:  06/13/18 137/85  03/11/18 130/72  03/03/18 130/82     2. Type 2 diabetes mellitus without complication, without long-term current use of insulin (HCC)  -does not check blood sugars at home -one time symptom of high BS (came in to MD and blood sugar was 260) -no GI upset with Metformin   3. Mixed hyperlipidemia  -no myalgias on statin -does not watch fat intake in diet; but does more baking and broiling of meats   4. Benign prostatic hyperplasia without lower urinary tract symptoms  -no issues with starting or stopping urinary stream -last PSA on 10/2016 was 3.0 -appt with Urologist Aug 04, 2018   5. BMI 32.0-32.9,adult  -does not exercise -no recent weight changes     Outpatient Encounter Medications as of 06/13/2018  Medication Sig  . amLODipine (NORVASC) 10 MG tablet Take 1 tablet (10 mg total) by mouth daily.  Marland Kitchen atorvastatin (LIPITOR) 40 MG tablet Take 1 tablet (40 mg total) by mouth daily.  . cholecalciferol (VITAMIN D) 1000 UNITS tablet Take 1 tablet (1,000 Units total) by mouth daily.  . finasteride (PROSCAR) 5 MG tablet TAKE ONE TABLET BY MOUTH ONCE DAILY  . hydrochlorothiazide (HYDRODIURIL) 25 MG tablet Take 1 tablet (25 mg total) by mouth daily.  Marland Kitchen losartan (COZAAR) 50 MG tablet Take 1 tablet (50 mg total) by mouth daily.  . metFORMIN (GLUCOPHAGE) 500 MG tablet Take 1 tablet (500 mg total) by mouth 2 (two) times daily with a meal.   No facility-administered encounter medications on file as of 06/13/2018.      New complaints: None today  Social history: Works 12 hour shifts at [employer]; will retire in January   Review of Systems    Constitutional: Negative for activity change, appetite change, chills, fatigue, fever and unexpected weight change.  HENT: Negative for congestion, ear pain, rhinorrhea, sinus pressure, sinus pain and sore throat.   Eyes: Negative for pain, redness and visual disturbance.  Respiratory: Negative for cough, chest tightness, shortness of breath and wheezing.   Cardiovascular: Negative for chest pain, palpitations and leg swelling.  Gastrointestinal: Negative for abdominal pain, constipation, diarrhea, nausea and vomiting.  Endocrine: Negative for cold intolerance, heat intolerance, polydipsia, polyphagia and polyuria.  Genitourinary: Negative for difficulty urinating, dysuria and urgency.  Musculoskeletal: Negative for arthralgias, gait problem, joint swelling and myalgias.  Skin: Negative for rash and wound.  Allergic/Immunologic: Positive for environmental allergies. Negative for food allergies.  Neurological: Negative for dizziness, tremors, weakness and numbness.  Hematological: Does not bruise/bleed easily.  Psychiatric/Behavioral: Positive for sleep disturbance (chronic sleep issues, staying asleep). Negative for behavioral problems, confusion, decreased concentration and suicidal ideas. The patient is not nervous/anxious.        Objective:   Physical Exam  Constitutional: He is oriented to person, place, and time. He appears well-developed and well-nourished.  HENT:  Head: Normocephalic and atraumatic.  Right Ear: External ear normal.  Left Ear: External ear normal.  Nose: Nose normal.  Mouth/Throat: Oropharynx is clear and moist. No oropharyngeal exudate.  Eyes: Pupils are equal, round, and reactive to light. Conjunctivae and EOM are normal.  Neck:  Normal range of motion. Neck supple. No thyromegaly present.  Cardiovascular: Normal rate, regular rhythm, normal heart sounds and intact distal pulses.  Pulmonary/Chest: Effort normal and breath sounds normal.  Abdominal: Soft. Bowel  sounds are normal.  Musculoskeletal: Normal range of motion. Edema: +1 RLE pretibial.  Neurological: He is alert and oriented to person, place, and time. He displays normal reflexes. No cranial nerve deficit.  Skin: Skin is warm and dry. Lesion (large lipoma on posterior neck) noted.     Psychiatric: He has a normal mood and affect. His behavior is normal. Judgment and thought content normal.  Nursing note and vitals reviewed.  BP 137/85   Pulse 80   Temp 97.6 F (36.4 C) (Oral)   Ht _0  (1.854 m)   Wt 219 lb (99.3 kg)   BMI 28.89 kg/m     A1C today 6.7, down from 7.1 3 months ago Assessment & Plan:  Jonathon Adkins comes in today with chief complaint of Medical Management of Chronic Issues   Diagnosis and orders addressed:  1. Essential hypertension -low salt diet - CMP14+EGFR -check blood pressures at home -compression hose if LE swelling occurs  2. Type 2 diabetes mellitus without complication, without long-term current use of insulin (HCC) -watch carbs - Bayer DCA Hb A1c Waived -check blood sugars at home  3. Mixed hyperlipidemia -low fat diet - Lipid panel  4. Benign prostatic hyperplasia without lower urinary tract symptoms -keep Urologist appointment   5. BMI 32.0-32.9,adult -dietary changes -encouraged exercise   Labs pending Health Maintenance reviewed Diet and exercise encouraged  Follow up plan: 3 months   [name] Done, FNP

## 2018-09-18 ENCOUNTER — Ambulatory Visit: Payer: Managed Care, Other (non HMO) | Admitting: Nurse Practitioner

## 2018-09-18 ENCOUNTER — Encounter: Payer: Self-pay | Admitting: Nurse Practitioner

## 2018-09-18 VITALS — BP 136/86 | HR 80 | Temp 96.9°F | Ht 73.0 in | Wt 225.0 lb

## 2018-09-18 DIAGNOSIS — I10 Essential (primary) hypertension: Secondary | ICD-10-CM

## 2018-09-18 DIAGNOSIS — E782 Mixed hyperlipidemia: Secondary | ICD-10-CM

## 2018-09-18 DIAGNOSIS — N400 Enlarged prostate without lower urinary tract symptoms: Secondary | ICD-10-CM

## 2018-09-18 DIAGNOSIS — Z6832 Body mass index (BMI) 32.0-32.9, adult: Secondary | ICD-10-CM

## 2018-09-18 DIAGNOSIS — E119 Type 2 diabetes mellitus without complications: Secondary | ICD-10-CM

## 2018-09-18 LAB — CMP14+EGFR
ALT: 24 IU/L (ref 0–44)
AST: 17 IU/L (ref 0–40)
Albumin/Globulin Ratio: 1.6 (ref 1.2–2.2)
Albumin: 4.3 g/dL (ref 3.8–4.8)
Alkaline Phosphatase: 83 IU/L (ref 39–117)
BUN/Creatinine Ratio: 11 (ref 10–24)
BUN: 13 mg/dL (ref 8–27)
Bilirubin Total: 0.6 mg/dL (ref 0.0–1.2)
CALCIUM: 10.2 mg/dL (ref 8.6–10.2)
CO2: 23 mmol/L (ref 20–29)
CREATININE: 1.22 mg/dL (ref 0.76–1.27)
Chloride: 98 mmol/L (ref 96–106)
GFR calc Af Amer: 69 mL/min/{1.73_m2} (ref >59)
GFR, EST NON AFRICAN AMERICAN: 60 mL/min/{1.73_m2} (ref >59)
GLOBULIN, TOTAL: 2.7 g/dL (ref 1.5–4.5)
Glucose: 146 mg/dL — ABNORMAL HIGH (ref 65–99)
Potassium: 4.4 mmol/L (ref 3.5–5.2)
SODIUM: 140 mmol/L (ref 134–144)
TOTAL PROTEIN: 7 g/dL (ref 6.0–8.5)

## 2018-09-18 LAB — LIPID PANEL
CHOL/HDL RATIO: 2.4 ratio (ref 0.0–5.0)
Cholesterol, Total: 137 mg/dL (ref 100–199)
HDL: 57 mg/dL (ref >39)
LDL CALC: 67 mg/dL (ref 0–99)
TRIGLYCERIDES: 65 mg/dL (ref 0–149)
VLDL Cholesterol Cal: 13 mg/dL (ref 5–40)

## 2018-09-18 LAB — BAYER DCA HB A1C WAIVED: HB A1C (BAYER DCA - WAIVED): 6.8 % (ref <7.0)

## 2018-09-18 MED ORDER — HYDROCHLOROTHIAZIDE 25 MG PO TABS: 25.0000 mg | ORAL_TABLET | Freq: Every day | ORAL | 1 refills | Status: DC

## 2018-09-18 MED ORDER — ATORVASTATIN CALCIUM 40 MG PO TABS: 40.0000 mg | ORAL_TABLET | Freq: Every day | ORAL | 1 refills | Status: DC

## 2018-09-18 MED ORDER — METFORMIN HCL 500 MG PO TABS: 500.0000 mg | ORAL_TABLET | Freq: Two times a day (BID) | ORAL | 1 refills | Status: DC

## 2018-09-18 MED ORDER — LOSARTAN POTASSIUM 50 MG PO TABS: 50.0000 mg | ORAL_TABLET | Freq: Every day | ORAL | 1 refills | Status: DC

## 2018-09-18 MED ORDER — AMLODIPINE BESYLATE 10 MG PO TABS: 10.0000 mg | ORAL_TABLET | Freq: Every day | ORAL | 1 refills | Status: DC

## 2018-09-18 NOTE — Progress Notes (Signed)
Subjective:    Patient ID: Jonathon Adkins, male    DOB: August 12, 1951, 68 y.o.   MRN: [id number]   Chief Complaint: medical management of chronic issues  HPI:  1. Essential hypertension  No c/o chest pain, sob or headache. Does not check blood pressure at  Home. BP Readings from Last 3 Encounters:  06/13/18 137/85  03/11/18 130/72  03/03/18 130/82     2. Type 2 diabetes mellitus without complication, without long-term current use of insulin (HCC) last hgaba1c was 6.7. his fasting blood sugars at hoe remain under 140. He denies any low blood sugars  3. Mixed hyperlipidemia  Does not watch diet and does very little exercise.  4. Benign prostatic hyperplasia without lower urinary tract symptoms  He denies any problems voiding. He saw urology last month and was doing well.  5. BMI 32.0-32.9,adult  No recent weight changes.    Outpatient Encounter Medications as of 09/18/2018  Medication Sig  . amLODipine (NORVASC) 10 MG tablet Take 1 tablet (10 mg total) by mouth daily.  Marland Kitchen atorvastatin (LIPITOR) 40 MG tablet Take 1 tablet (40 mg total) by mouth daily.  . cholecalciferol (VITAMIN D) 1000 UNITS tablet Take 1 tablet (1,000 Units total) by mouth daily.  . finasteride (PROSCAR) 5 MG tablet TAKE ONE TABLET BY MOUTH ONCE DAILY  . hydrochlorothiazide (HYDRODIURIL) 25 MG tablet Take 1 tablet (25 mg total) by mouth daily.  Marland Kitchen losartan (COZAAR) 50 MG tablet Take 1 tablet (50 mg total) by mouth daily.  . metFORMIN (GLUCOPHAGE) 500 MG tablet Take 1 tablet (500 mg total) by mouth 2 (two) times daily with a meal.       New complaints: None today  Social history: Lives with wife. Daughter lives out of town but  He is very close to her and talks to her almost everyday.  Review of Systems  Constitutional: Negative for activity change and appetite change.  HENT: Negative.   Eyes: Negative for pain.  Respiratory: Negative for shortness of breath.   Cardiovascular: Negative for chest pain,  palpitations and leg swelling.  Gastrointestinal: Negative for abdominal pain.  Endocrine: Negative for polydipsia.  Genitourinary: Negative.   Skin: Negative for rash.  Neurological: Negative for dizziness, weakness and headaches.  Hematological: Does not bruise/bleed easily.  Psychiatric/Behavioral: Negative.   All other systems reviewed and are negative.      Objective:   Physical Exam Vitals signs and nursing note reviewed.  Constitutional:      Appearance: Normal appearance. He is well-developed.  HENT:     Head: Normocephalic.     Nose: Nose normal.  Eyes:     Pupils: Pupils are equal, round, and reactive to light.  Neck:     Musculoskeletal: Normal range of motion and neck supple.     Thyroid: No thyroid mass or thyromegaly.     Vascular: No carotid bruit or JVD.     Trachea: Phonation normal.  Cardiovascular:     Rate and Rhythm: Normal rate and regular rhythm.  Pulmonary:     Effort: Pulmonary effort is normal. No respiratory distress.     Breath sounds: Normal breath sounds.  Abdominal:     General: Bowel sounds are normal.     Palpations: Abdomen is soft.     Tenderness: There is no abdominal tenderness.  Musculoskeletal: Normal range of motion.  Lymphadenopathy:     Cervical: No cervical adenopathy.  Skin:    General: Skin is warm and dry.  Neurological:  Mental Status: He is alert and oriented to person, place, and time.  Psychiatric:        Behavior: Behavior normal.        Thought Content: Thought content normal.        Judgment: Judgment normal.    BP 136/86   Pulse 80   Temp (!) 96.9 F (36.1 C) (Oral)   Ht _0  (1.854 m)   Wt 225 lb (102.1 kg)   BMI 29.69 kg/m   hgba1c 6.8%      Assessment & Plan:  RUDY LUHMANN comes in today with chief complaint of Medical Management of Chronic Issues   Diagnosis and orders addressed:  1. Essential hypertension Low sodium diet - CMP14+EGFR - losartan (COZAAR) 50 MG tablet; Take 1 tablet (50  mg total) by mouth daily.  Dispense: 90 tablet; Refill: 1 - hydrochlorothiazide (HYDRODIURIL) 25 MG tablet; Take 1 tablet (25 mg total) by mouth daily.  Dispense: 90 tablet; Refill: 1 - amLODipine (NORVASC) 10 MG tablet; Take 1 tablet (10 mg total) by mouth daily.  Dispense: 90 tablet; Refill: 1  2. Type 2 diabetes mellitus without complication, without long-term current use of insulin (HCC) Watch carbs in diet - Bayer DCA Hb A1c Waived - Microalbumin / creatinine urine ratio - metFORMIN (GLUCOPHAGE) 500 MG tablet; Take 1 tablet (500 mg total) by mouth 2 (two) times daily with a meal.  Dispense: 180 tablet; Refill: 1  3. Mixed hyperlipidemia Low fat diet - Lipid panel - atorvastatin (LIPITOR) 40 MG tablet; Take 1 tablet (40 mg total) by mouth daily.  Dispense: 90 tablet; Refill: 1  4. Benign prostatic hyperplasia without lower urinary tract symptoms Keep follow up with cardiology  5. BMI 32.0-32.9,adult Discussed diet and exercise for person with BMI >25 Will recheck weight in 3-6 months   Labs pending Health Maintenance reviewed Diet and exercise encouraged  Follow up plan: 3 months   [name] Done, FNP

## 2018-09-18 NOTE — Patient Instructions (Signed)
Health Maintenance After Age 70 After age 70, you are at a higher risk for certain long-term diseases and infections as well as injuries from falls. Falls are a major cause of broken bones and head injuries in people who are older than age 70. Getting regular preventive care can help to keep you healthy and well. Preventive care includes getting regular testing and making lifestyle changes as recommended by your health care provider. Talk with your health care provider about:  Which screenings and tests you should have. A screening is a test that checks for a disease when you have no symptoms.  A diet and exercise plan that is right for you. What should I know about screenings and tests to prevent falls? Screening and testing are the best ways to find a health problem early. Early diagnosis and treatment give you the best chance of managing medical conditions that are common after age 70. Certain conditions and lifestyle choices may make you more likely to have a fall. Your health care provider may recommend:  Regular vision checks. Poor vision and conditions such as cataracts can make you more likely to have a fall. If you wear glasses, make sure to get your prescription updated if your vision changes.  Medicine review. Work with your health care provider to regularly review all of the medicines you are taking, including over-the-counter medicines. Ask your health care provider about any side effects that may make you more likely to have a fall. Tell your health care provider if any medicines that you take make you feel dizzy or sleepy.  Osteoporosis screening. Osteoporosis is a condition that causes the bones to get weaker. This can make the bones weak and cause them to break more easily.  Blood pressure screening. Blood pressure changes and medicines to control blood pressure can make you feel dizzy.  Strength and balance checks. Your health care provider may recommend certain tests to check your  strength and balance while standing, walking, or changing positions.  Foot health exam. Foot pain and numbness, as well as not wearing proper footwear, can make you more likely to have a fall.  Depression screening. You may be more likely to have a fall if you have a fear of falling, feel emotionally low, or feel unable to do activities that you used to do.  Alcohol use screening. Using too much alcohol can affect your balance and may make you more likely to have a fall. What actions can I take to lower my risk of falls? General instructions  Talk with your health care provider about your risks for falling. Tell your health care provider if: ? You fall. Be sure to tell your health care provider about all falls, even ones that seem minor. ? You feel dizzy, sleepy, or off-balance.  Take over-the-counter and prescription medicines only as told by your health care provider. These include any supplements.  Eat a healthy diet and maintain a healthy weight. A healthy diet includes low-fat dairy products, low-fat (lean) meats, and fiber from whole grains, beans, and lots of fruits and vegetables. Home safety  Remove any tripping hazards, such as rugs, cords, and clutter.  Install safety equipment such as grab bars in bathrooms and safety rails on stairs.  Keep rooms and walkways well-lit. Activity   Follow a regular exercise program to stay fit. This will help you maintain your balance. Ask your health care provider what types of exercise are appropriate for you.  If you need a cane or   walker, use it as recommended by your health care provider.  Wear supportive shoes that have nonskid soles. Lifestyle  Do not drink alcohol if your health care provider tells you not to drink.  If you drink alcohol, limit how much you have: ? 0-1 drink a day for women. ? 0-2 drinks a day for men.  Be aware of how much alcohol is in your drink. In the [place] one drink equals one typical bottle of beer (12  oz), one-half glass of wine (5 oz), or one shot of hard liquor (1 oz).  Do not use any products that contain nicotine or tobacco, such as cigarettes and e-cigarettes. If you need help quitting, ask your health care provider. Summary  Having a healthy lifestyle and getting preventive care can help to protect your health and wellness after age 70.  Screening and testing are the best way to find a health problem early and help you avoid having a fall. Early diagnosis and treatment give you the best chance for managing medical conditions that are more common for people who are older than age 70.  Falls are a major cause of broken bones and head injuries in people who are older than age 70. Take precautions to prevent a fall at home.  Work with your health care provider to learn what changes you can make to improve your health and wellness and to prevent falls. This information is not intended to replace advice given to you by your health care provider. Make sure you discuss any questions you have with your health care provider. Document Released: 06/22/2017 Document Revised: 06/22/2017 Document Reviewed: 06/22/2017 Elsevier Interactive Patient Education  2019 [employer] Inc[employer]  

## 2018-11-11 ENCOUNTER — Encounter: Payer: Self-pay | Admitting: Nurse Practitioner

## 2018-11-11 ENCOUNTER — Ambulatory Visit: Payer: Managed Care, Other (non HMO) | Admitting: Nurse Practitioner

## 2018-11-11 ENCOUNTER — Other Ambulatory Visit: Payer: Self-pay

## 2018-11-11 VITALS — BP 135/75 | HR 81 | Temp 97.8°F | Ht 73.0 in | Wt 226.8 lb

## 2018-11-11 DIAGNOSIS — L247 Irritant contact dermatitis due to plants, except food: Secondary | ICD-10-CM

## 2018-11-11 MED ORDER — METHYLPREDNISOLONE ACETATE 80 MG/ML IJ SUSP
80.0000 mg | Freq: Once | INTRAMUSCULAR | Status: AC
  Administered 2018-11-11: 80 mg via INTRAMUSCULAR

## 2018-11-11 NOTE — Progress Notes (Signed)
   Subjective:    Patient ID: Jonathon Adkins, male    DOB: 04/20/1951, 68 y.o.   MRN: [id number]   Chief Complaint: posion oak (x 1 week- bilateral arms and back)   HPI Has been doing yard work and got into poison [employer]. It has been itching for over a week and he has been using calamine lotion and is still spreading.   Review of Systems  Constitutional: Negative.   HENT: Negative.   Respiratory: Negative.   Cardiovascular: Negative.   Genitourinary: Negative.   Neurological: Negative.   Psychiatric/Behavioral: Negative.   All other systems reviewed and are negative.      Objective:   Physical Exam Vitals signs and nursing note reviewed.  Constitutional:      Appearance: Normal appearance. He is normal weight.  HENT:     Right Ear: Tympanic membrane, ear canal and external ear normal.     Left Ear: Tympanic membrane, ear canal and external ear normal.     Nose: Nose normal.     Mouth/Throat:     Mouth: Mucous membranes are moist.  Eyes:     Extraocular Movements: Extraocular movements intact.     Pupils: Pupils are equal, round, and reactive to light.  Neck:     Musculoskeletal: Normal range of motion and neck supple.  Cardiovascular:     Rate and Rhythm: Regular rhythm.  Pulmonary:     Effort: Pulmonary effort is normal.     Breath sounds: Normal breath sounds.  Skin:    General: Skin is warm.     Findings: Rash (erythematous mauculo papular rash on forearms and trunk) present.  Neurological:     Mental Status: He is alert.    BP 135/75   Pulse 81   Temp 97.8 F (36.6 C) (Oral)   Ht 6\' 1"  (1.854 m)   Wt 226 lb 12.8 oz (102.9 kg)   BMI 29.92 kg/m      Assessment & Plan:  [name] in today with chief complaint of posion oak (x 1 week- bilateral arms and back)   1. Irritant contact dermatitis due to plants, except food Avoid scratching Continue calamine Benadryl for itching RTO prn  [name] Done, FNP  - methylPREDNISolone acetate  (DEPO-MEDROL) injection 80 mg

## 2018-11-11 NOTE — Patient Instructions (Signed)
Poison Oak Dermatitis    Poison oak dermatitis is redness and soreness (inflammation) of the skin. It is caused by a chemical that is on the leaves of the poison oak plant. You may also have itching, a rash, and blisters. Symptoms often clear up in 1-2 weeks.  You may get this condition by touching a poison oak plant. You can also get it by touching something that has the chemical on it. This may include animals or objects that have come in contact with the plant.  Follow these instructions at home:  General instructions  · Take or apply over-the-counter and prescription medicines only as told by your doctor.  · If you touch poison oak, wash your skin with soap and cold water right away.  · Use hydrocortisone creams or calamine lotion as needed to help with itching.  · Take oatmeal baths as needed. Use colloidal oatmeal. You can get this at a pharmacy or grocery store. Follow the instructions on the package.  · Do not scratch or rub your skin.  · While you have the rash, wash your clothes right after you wear them.  Prevention  · Know what poison oak looks like so you can avoid it. This plant has three leaves with flowering branches on a single stem. The leaves are fuzzy. They have a toothlike edge.  · If you have touched poison oak, wash with soap and water right away. Be sure to wash under your fingernails.  · When hiking or camping, wear long pants, a long-sleeved shirt, tall socks, and hiking boots. You can also use a lotion on your skin that helps to prevent contact with the chemical on the plant.  · If you think that your clothes or outdoor gear came in contact with poison oak, rinse them off with a garden hose before you bring them inside your house.  Contact a doctor if:  · You have open sores in the rash area.  · You have more redness, swelling, or pain in the affected area.  · You have redness that spreads beyond the rash area.  · You have fluid, blood, or pus coming from the affected area.  · You have a  fever.  · You have a rash over a large area of your body.  · You have a rash on your eyes, mouth, or genitals.  · Your rash does not improve after a few days.  Get help right away if:  · Your face swells or your eyes swell shut.  · You have trouble breathing.  · You have trouble swallowing.  This information is not intended to replace advice given to you by your health care provider. Make sure you discuss any questions you have with your health care provider.  Document Released: 09/11/2010 Document Revised: 05/03/2018 Document Reviewed: 01/15/2015  Elsevier Interactive Patient Education © 2019 [employer]

## 2018-12-18 ENCOUNTER — Ambulatory Visit (INDEPENDENT_AMBULATORY_CARE_PROVIDER_SITE_OTHER): Payer: Managed Care, Other (non HMO) | Admitting: Nurse Practitioner

## 2018-12-18 ENCOUNTER — Encounter: Payer: Self-pay | Admitting: Nurse Practitioner

## 2018-12-18 ENCOUNTER — Other Ambulatory Visit: Payer: Self-pay

## 2018-12-18 DIAGNOSIS — E782 Mixed hyperlipidemia: Secondary | ICD-10-CM

## 2018-12-18 DIAGNOSIS — I10 Essential (primary) hypertension: Secondary | ICD-10-CM

## 2018-12-18 DIAGNOSIS — N400 Enlarged prostate without lower urinary tract symptoms: Secondary | ICD-10-CM

## 2018-12-18 DIAGNOSIS — E119 Type 2 diabetes mellitus without complications: Secondary | ICD-10-CM

## 2018-12-18 DIAGNOSIS — Z6832 Body mass index (BMI) 32.0-32.9, adult: Secondary | ICD-10-CM

## 2018-12-18 MED ORDER — HYDROCHLOROTHIAZIDE 25 MG PO TABS: 25.0000 mg | ORAL_TABLET | Freq: Every day | ORAL | 1 refills | Status: DC

## 2018-12-18 MED ORDER — LOSARTAN POTASSIUM 50 MG PO TABS: 50.0000 mg | ORAL_TABLET | Freq: Every day | ORAL | 1 refills | Status: DC

## 2018-12-18 MED ORDER — METFORMIN HCL 500 MG PO TABS: 500.0000 mg | ORAL_TABLET | Freq: Two times a day (BID) | ORAL | 1 refills | Status: DC

## 2018-12-18 MED ORDER — ATORVASTATIN CALCIUM 40 MG PO TABS: 40.0000 mg | ORAL_TABLET | Freq: Every day | ORAL | 1 refills | Status: DC

## 2018-12-18 MED ORDER — AMLODIPINE BESYLATE 10 MG PO TABS: 10.0000 mg | ORAL_TABLET | Freq: Every day | ORAL | 1 refills | Status: DC

## 2018-12-18 NOTE — Progress Notes (Signed)
Patient ID: Jonathon Adkins, male   DOB: [birth year], 68 y.o.   MRN: [id number]    Virtual Visit via telephone Note  I connected with Jonathon Adkins on 12/18/18 at 10:00 AM by telephone and verified that I am speaking with the correct person using two identifiers. Jonathon Adkins is currently located at home and his wife is currently with her during visit. The provider, [name] Done, FNP is located in their office at time of visit.  I discussed the limitations, risks, security and privacy concerns of performing an evaluation and management service by telephone and the availability of in person appointments. I also discussed with the patient that there may be a patient responsible charge related to this service. The patient expressed understanding and agreed to proceed.   History and Present Illness:   Chief Complaint: Medical Management of Chronic Issues    HPI:  1. Essential hypertension No c/o chest pain, sob or headache. Does not check blood pressure at home. BP Readings from Last 3 Encounters:  11/11/18 135/75  09/18/18 136/86  06/13/18 137/85     2. Mixed hyperlipidemia watches diet and stays very active.   3. Type 2 diabetes mellitus without complication, without long-term current use of insulin (HCC) Last HGBA1c 6.8%. His blood sugars are running in 120"s. No c/o hypoglycemia  4. Benign prostatic hyperplasia without lower urinary tract symptoms No problems voiding  5. BMI 32.0-32.9,adult No weight chnages    Outpatient Encounter Medications as of 12/18/2018  Medication Sig  . amLODipine (NORVASC) 10 MG tablet Take 1 tablet (10 mg total) by mouth daily.  Marland Kitchen atorvastatin (LIPITOR) 40 MG tablet Take 1 tablet (40 mg total) by mouth daily.  . cholecalciferol (VITAMIN D) 1000 UNITS tablet Take 1 tablet (1,000 Units total) by mouth daily.  . finasteride (PROSCAR) 5 MG tablet TAKE ONE TABLET BY MOUTH ONCE DAILY  . hydrochlorothiazide (HYDRODIURIL) 25 MG tablet Take 1 tablet  (25 mg total) by mouth daily.  Marland Kitchen losartan (COZAAR) 50 MG tablet Take 1 tablet (50 mg total) by mouth daily.  . metFORMIN (GLUCOPHAGE) 500 MG tablet Take 1 tablet (500 mg total) by mouth 2 (two) times daily with a meal.     New complaints: None today  Social history: Lives with wife. Still working at [employer]       Review of Systems  Constitutional: Negative for diaphoresis and weight loss.  Eyes: Negative for blurred vision, double vision and pain.  Respiratory: Negative for shortness of breath.   Cardiovascular: Negative for chest pain, palpitations, orthopnea and leg swelling.  Gastrointestinal: Negative for abdominal pain.  Skin: Negative for rash.  Neurological: Negative for dizziness, sensory change, loss of consciousness, weakness and headaches.  Endo/Heme/Allergies: Negative for polydipsia. Does not bruise/bleed easily.  Psychiatric/Behavioral: Negative for memory loss. The patient does not have insomnia.   All other systems reviewed and are negative.    Observations/Objective: Alert and oriented- answers all questions appropriately No distressnoted  Assessment and Plan: Jonathon Adkins comes in today with chief complaint of Medical Management of Chronic Issues   Diagnosis and orders addressed:  1. Essential hypertension Low sodium diet - losartan (COZAAR) 50 MG tablet; Take 1 tablet (50 mg total) by mouth daily.  Dispense: 90 tablet; Refill: 1 - hydrochlorothiazide (HYDRODIURIL) 25 MG tablet; Take 1 tablet (25 mg total) by mouth daily.  Dispense: 90 tablet; Refill: 1 - amLODipine (NORVASC) 10 MG tablet; Take 1 tablet (10 mg total) by mouth daily.  Dispense: 90 tablet; Refill: 1  2. Mixed hyperlipidemia Low fat diet - atorvastatin (LIPITOR) 40 MG tablet; Take 1 tablet (40 mg total) by mouth daily.  Dispense: 90 tablet; Refill: 1  3. Type 2 diabetes mellitus without complication, without long-term current use of insulin (HCC) Continue to watch carbs in diet  - metFORMIN (GLUCOPHAGE) 500 MG tablet; Take 1 tablet (500 mg total) by mouth 2 (two) times daily with a meal.  Dispense: 180 tablet; Refill: 1  4. Benign prostatic hyperplasia without lower urinary tract symptoms Continue proscar as rx- keep follow up with urology  5. BMI 32.0-32.9,adult Discussed diet and exercise for person with BMI >25 Will recheck weight in 3-6 months    Previous labs reviewed Health Maintenance reviewed Diet and exercise encouraged  Follow up plan: 3 months      I discussed the assessment and treatment plan with the patient. The patient was provided an opportunity to ask questions and all were answered. The patient agreed with the plan and demonstrated an understanding of the instructions.   The patient was advised to call back or seek an in-person evaluation if the symptoms worsen or if the condition fails to improve as anticipated.  The above assessment and management plan was discussed with the patient. The patient verbalized understanding of and has agreed to the management plan. Patient is aware to call the clinic if symptoms persist or worsen. Patient is aware when to return to the clinic for a follow-up visit. Patient educated on when it is appropriate to go to the emergency department.    I provided 15 minutes of non-face-to-face time during this encounter.    [name] Done, FNP

## 2019-01-25 ENCOUNTER — Other Ambulatory Visit: Payer: Self-pay | Admitting: Nurse Practitioner

## 2019-01-25 DIAGNOSIS — N400 Enlarged prostate without lower urinary tract symptoms: Secondary | ICD-10-CM

## 2019-02-19 ENCOUNTER — Encounter: Payer: Self-pay | Admitting: Family Medicine

## 2019-02-19 ENCOUNTER — Other Ambulatory Visit: Payer: Self-pay

## 2019-02-19 ENCOUNTER — Ambulatory Visit (INDEPENDENT_AMBULATORY_CARE_PROVIDER_SITE_OTHER): Payer: Managed Care, Other (non HMO) | Admitting: Family Medicine

## 2019-02-19 DIAGNOSIS — L237 Allergic contact dermatitis due to plants, except food: Secondary | ICD-10-CM

## 2019-02-19 MED ORDER — HYDROXYZINE HCL 25 MG PO TABS: 25.0000 mg | ORAL_TABLET | Freq: Three times a day (TID) | ORAL | 0 refills | Status: DC | PRN

## 2019-02-19 MED ORDER — PREDNISONE 20 MG PO TABS: ORAL_TABLET | ORAL | 0 refills | Status: DC

## 2019-02-20 NOTE — Progress Notes (Signed)
Virtual Visit via telephone Note Due to COVID-19, visit is conducted virtually and was requested by patient. This visit type was conducted due to national recommendations for restrictions regarding the COVID-19 Pandemic (e.g. social distancing) in an effort to limit this patient's exposure and mitigate transmission in our community. All issues noted in this document were discussed and addressed.  A physical exam was not performed with this format.   I connected with Jonathon Adkins on 02/19/2019 at 1635 by telephone and verified that I am speaking with the correct person using two identifiers. Jonathon Adkins is currently located at home and family is currently with them during visit. The provider, Monia Pouch, FNP is located in their office at time of visit.  I discussed the limitations, risks, security and privacy concerns of performing an evaluation and management service by telephone and the availability of in person appointments. I also discussed with the patient that there may be a patient responsible charge related to this service. The patient expressed understanding and agreed to proceed.  Subjective:  Patient ID: Jonathon Adkins, male    DOB: 25-Jul-1951, 68 y.o.   MRN: [id number]  Chief Complaint:  Rash   HPI: Jonathon Adkins is a 68 y.o. male presenting on 02/19/2019 for Rash   Pt reports a red, raised, pruritic, vesicular rash to his arms, legs, and back. States this started a few days ago after being out in the yard working. States he has been using calamine lotion with some relief of symptoms.   Rash This is a new problem. The current episode started in the past 7 days. The problem has been gradually worsening since onset. The affected locations include the back, left arm, right arm, left lower leg, left upper leg, right lower leg and right upper leg. He was exposed to plant contact. Pertinent negatives include no anorexia, congestion, cough, diarrhea, eye pain, facial edema, fatigue, fever,  joint pain, nail changes, rhinorrhea, shortness of breath, sore throat or vomiting. Past treatments include anti-itch cream. The treatment provided mild relief.     Relevant past medical, surgical, family, and social history reviewed and updated as indicated.  Allergies and medications reviewed and updated.   Past Medical History:  Diagnosis Date   Diabetes mellitus without complication ([place])    Enlarged prostate    Hyperlipidemia    Hypertension     Past Surgical History:  Procedure Laterality Date   COLONOSCOPY N/A 02/06/2014   Procedure: COLONOSCOPY;  Surgeon: Rogene Houston, MD;  Location: [facility];  Service: Endoscopy;  Laterality: N/A;  100    Social History   Socioeconomic History   Marital status: Married    Spouse name: Not on file   Number of children: Not on file   Years of education: Not on file   Highest education level: Not on file  Occupational History   Not on file  Social Needs   Financial resource strain: Not on file   Food insecurity    Worry: Not on file    Inability: Not on file   Transportation needs    Medical: Not on file    Non-medical: Not on file  Tobacco Use   Smoking status: Never Smoker   Smokeless tobacco: Never Used  Substance and Sexual Activity   Alcohol use: No   Drug use: No   Sexual activity: Not on file  Lifestyle   Physical activity    Days per week: Not on file  Minutes per session: Not on file   Stress: Not on file  Relationships   Social connections    Talks on phone: Not on file    Gets together: Not on file    Attends religious service: Not on file    Active member of club or organization: Not on file    Attends meetings of clubs or organizations: Not on file    Relationship status: Not on file   Intimate partner violence    Fear of current or ex partner: Not on file    Emotionally abused: Not on file    Physically abused: Not on file    Forced sexual activity: Not on file  Other  Topics Concern   Not on file  Social History Narrative   Not on file    Outpatient Encounter Medications as of 02/19/2019  Medication Sig   amLODipine (NORVASC) 10 MG tablet Take 1 tablet (10 mg total) by mouth daily.   atorvastatin (LIPITOR) 40 MG tablet Take 1 tablet (40 mg total) by mouth daily.   cholecalciferol (VITAMIN D) 1000 UNITS tablet Take 1 tablet (1,000 Units total) by mouth daily.   finasteride (PROSCAR) 5 MG tablet Take 1 tablet by mouth once daily   hydrochlorothiazide (HYDRODIURIL) 25 MG tablet Take 1 tablet (25 mg total) by mouth daily.   hydrOXYzine (ATARAX/VISTARIL) 25 MG tablet Take 1 tablet (25 mg total) by mouth 3 (three) times daily as needed for itching.   losartan (COZAAR) 50 MG tablet Take 1 tablet (50 mg total) by mouth daily.   metFORMIN (GLUCOPHAGE) 500 MG tablet Take 1 tablet (500 mg total) by mouth 2 (two) times daily with a meal.   predniSONE (DELTASONE) 20 MG tablet 2 po at sametime daily for 5 days   No facility-administered encounter medications on file as of 02/19/2019.     Allergies  Allergen Reactions   Codeine    Lisinopril     Review of Systems  Constitutional: Negative for chills, fatigue and fever.  HENT: Negative for congestion, rhinorrhea, sore throat and trouble swallowing.   Eyes: Negative for pain.  Respiratory: Negative for cough, shortness of breath, wheezing and stridor.   Gastrointestinal: Negative for anorexia, diarrhea and vomiting.  Musculoskeletal: Negative for arthralgias, gait problem, joint pain and joint swelling.  Skin: Positive for rash. Negative for nail changes.  Neurological: Negative for weakness and headaches.  Psychiatric/Behavioral: Negative for confusion.  All other systems reviewed and are negative.        Observations/Objective: No vital signs or physical exam, this was a telephone or virtual health encounter.  Pt alert and oriented, answers all questions appropriately, and able to speak in  full sentences.    Assessment and Plan: Jonathon Adkins was seen today for rash.  Diagnoses and all orders for this visit:  Poison oak dermatitis Reported symptoms consistent with poison oak dermatitis. Symptomatic care discussed. Continue calamine lotion. Atarax as needed for severe itching. Prednisone as prescribed. Report any new or worsening symptoms.  -     predniSONE (DELTASONE) 20 MG tablet; 2 po at sametime daily for 5 days -     hydrOXYzine (ATARAX/VISTARIL) 25 MG tablet; Take 1 tablet (25 mg total) by mouth 3 (three) times daily as needed for itching.     Follow Up Instructions: Return if symptoms worsen or fail to improve.    I discussed the assessment and treatment plan with the patient. The patient was provided an opportunity to ask questions and all were answered.  The patient agreed with the plan and demonstrated an understanding of the instructions.   The patient was advised to call back or seek an in-person evaluation if the symptoms worsen or if the condition fails to improve as anticipated.  The above assessment and management plan was discussed with the patient. The patient verbalized understanding of and has agreed to the management plan. Patient is aware to call the clinic if symptoms persist or worsen. Patient is aware when to return to the clinic for a follow-up visit. Patient educated on when it is appropriate to go to the emergency department.    I provided 15 minutes of non-face-to-face time during this encounter. The call started at 1635. The call ended at 1650. The other time was used for coordination of care.    Monia Pouch, FNP-C [facility] [address] [place], [place] [address] [phone]

## 2019-03-03 ENCOUNTER — Other Ambulatory Visit: Payer: Self-pay | Admitting: Family Medicine

## 2019-03-03 DIAGNOSIS — L237 Allergic contact dermatitis due to plants, except food: Secondary | ICD-10-CM

## 2019-03-07 ENCOUNTER — Other Ambulatory Visit: Payer: Self-pay | Admitting: Family Medicine

## 2019-03-07 DIAGNOSIS — L237 Allergic contact dermatitis due to plants, except food: Secondary | ICD-10-CM

## 2019-03-07 NOTE — Telephone Encounter (Signed)
NOV 03/30/19

## 2019-03-07 NOTE — Telephone Encounter (Signed)
Please advise.  Seen 6/29 for poison oak

## 2019-03-26 ENCOUNTER — Other Ambulatory Visit: Payer: Self-pay | Admitting: Nurse Practitioner

## 2019-03-26 DIAGNOSIS — L237 Allergic contact dermatitis due to plants, except food: Secondary | ICD-10-CM

## 2019-03-29 NOTE — Progress Notes (Signed)
Virtual Visit via telephone Note Due to COVID-19 pandemic this visit was conducted virtually. This visit type was conducted due to national recommendations for restrictions regarding the COVID-19 Pandemic (e.g. social distancing, sheltering in place) in an effort to limit this patient's exposure and mitigate transmission in our community. All issues noted in this document were discussed and addressed.  A physical exam was not performed with this format.  I connected with Jonathon Adkins on 03/29/19 at 9:00 by telephone and verified that I am speaking with the correct person using two identifiers. Jonathon Adkins is currently located at home and his wife is currently with her during visit. The provider, [name] Done, FNP is located in their office at time of visit.  I discussed the limitations, risks, security and privacy concerns of performing an evaluation and management service by telephone and the availability of in person appointments. I also discussed with the patient that there may be a patient responsible charge related to this service. The patient expressed understanding and agreed to proceed.   History and Present Illness:   Chief Complaint: medical management of chronic issues   HPI:  1. Essential hypertension No c/o chest pain, sob or headache. Does  Not check blood pressure at home BP Readings from Last 3 Encounters:  11/11/18 135/75  09/18/18 136/86  06/13/18 137/85     2. Mixed hyperlipidemia Does not watch diet and does very little exercise. Says that he does stay very active though.  3. Type 2 diabetes mellitus without complication, without long-term current use of insulin (HCC) Last hgab1c was 6.8% . Fasting blood sugars are running around 110-120 consistently. He denies any symptoms of low blood sugars.  4. BMI 32.0-32.9,adult No recent weight changes    Outpatient Encounter Medications as of 03/30/2019  Medication Sig  . amLODipine (NORVASC) 10 MG tablet  Take 1 tablet (10 mg total) by mouth daily.  Marland Kitchen atorvastatin (LIPITOR) 40 MG tablet Take 1 tablet (40 mg total) by mouth daily.  . cholecalciferol (VITAMIN D) 1000 UNITS tablet Take 1 tablet (1,000 Units total) by mouth daily.  . finasteride (PROSCAR) 5 MG tablet Take 1 tablet by mouth once daily  . hydrochlorothiazide (HYDRODIURIL) 25 MG tablet Take 1 tablet (25 mg total) by mouth daily.  . hydrOXYzine (ATARAX/VISTARIL) 25 MG tablet TAKE 1 TABLET BY MOUTH THREE TIMES DAILY AS NEEDED FOR ITCHING  . losartan (COZAAR) 50 MG tablet Take 1 tablet (50 mg total) by mouth daily.  . metFORMIN (GLUCOPHAGE) 500 MG tablet Take 1 tablet (500 mg total) by mouth 2 (two) times daily with a meal.  . predniSONE (DELTASONE) 20 MG tablet Take 2 tablets by mouth once daily for 5 days    Past Surgical History:  Procedure Laterality Date  . COLONOSCOPY N/A 02/06/2014   Procedure: COLONOSCOPY;  Surgeon: Rogene Houston, MD;  Location: [facility];  Service: Endoscopy;  Laterality: N/A;  100    Family History  Problem Relation Age of Onset  . Diabetes Mother   . Coronary artery disease Father     New complaints: Still having trouble with poison oak. Does well on steroids but as soon as stops steroids it comes right back.  Social history: Still working everyday.  Controlled substance contract: N/A    Review of Systems  Constitutional: Negative for diaphoresis and weight loss.  Eyes: Negative for blurred vision, double vision and pain.  Respiratory: Negative for shortness of breath.   Cardiovascular: Negative for chest pain,  palpitations, orthopnea and leg swelling.  Gastrointestinal: Negative for abdominal pain.  Skin: Negative for rash.  Neurological: Negative for dizziness, sensory change, loss of consciousness, weakness and headaches.  Endo/Heme/Allergies: Negative for polydipsia. Does not bruise/bleed easily.  Psychiatric/Behavioral: Negative for memory loss. The patient does not have insomnia.    All other systems reviewed and are negative.    Observations/Objective: Alert and oriented- answers all questions appropriately No distress  Assessment and Plan: Jonathon Adkins comes in today with chief complaint of No chief complaint on file.   Diagnosis and orders addressed:  1. Essential hypertension Low sodium diet - losartan (COZAAR) 50 MG tablet; Take 1 tablet (50 mg total) by mouth daily.  Dispense: 90 tablet; Refill: 1 - hydrochlorothiazide (HYDRODIURIL) 25 MG tablet; Take 1 tablet (25 mg total) by mouth daily.  Dispense: 90 tablet; Refill: 1 - amLODipine (NORVASC) 10 MG tablet; Take 1 tablet (10 mg total) by mouth daily.  Dispense: 90 tablet; Refill: 1  2. Mixed hyperlipidemia Low fat diet - atorvastatin (LIPITOR) 40 MG tablet; Take 1 tablet (40 mg total) by mouth daily.  Dispense: 90 tablet; Refill: 1  3. Type 2 diabetes mellitus without complication, without long-term current use of insulin (HCC) Carb counting encourgaed- especially while on steroids - metFORMIN (GLUCOPHAGE) 500 MG tablet; Take 1 tablet (500 mg total) by mouth 2 (two) times daily with a meal.  Dispense: 180 tablet; Refill: 1  4. BMI 32.0-32.9,adult Discussed diet and exercise for person with BMI >25 Will recheck weight in 3-6 months  5. Allergic contact dermatitis due to plants, except food Avoid scratching Avoid being out in hot sum - predniSONE (STERAPRED UNI-PAK 21 TAB) 10 MG (21) TBPK tablet; As directed x 6 days  Dispense: 21 tablet; Refill: 0   Previous labs reviewed Health Maintenance reviewed Diet and exercise encouraged  Follow up plan: 3 months     I discussed the assessment and treatment plan with the patient. The patient was provided an opportunity to ask questions and all were answered. The patient agreed with the plan and demonstrated an understanding of the instructions.   The patient was advised to call back or seek an in-person evaluation if the symptoms worsen or if the  condition fails to improve as anticipated.  The above assessment and management plan was discussed with the patient. The patient verbalized understanding of and has agreed to the management plan. Patient is aware to call the clinic if symptoms persist or worsen. Patient is aware when to return to the clinic for a follow-up visit. Patient educated on when it is appropriate to go to the emergency department.   Time call ended:  912  I provided 17 minutes of non-face-to-face time during this encounter.    [name] Done, FNP

## 2019-03-30 ENCOUNTER — Ambulatory Visit (INDEPENDENT_AMBULATORY_CARE_PROVIDER_SITE_OTHER): Payer: Managed Care, Other (non HMO) | Admitting: Nurse Practitioner

## 2019-03-30 ENCOUNTER — Encounter: Payer: Self-pay | Admitting: Nurse Practitioner

## 2019-03-30 DIAGNOSIS — E782 Mixed hyperlipidemia: Secondary | ICD-10-CM

## 2019-03-30 DIAGNOSIS — E119 Type 2 diabetes mellitus without complications: Secondary | ICD-10-CM

## 2019-03-30 DIAGNOSIS — Z6832 Body mass index (BMI) 32.0-32.9, adult: Secondary | ICD-10-CM

## 2019-03-30 DIAGNOSIS — I10 Essential (primary) hypertension: Secondary | ICD-10-CM

## 2019-03-30 DIAGNOSIS — L237 Allergic contact dermatitis due to plants, except food: Secondary | ICD-10-CM

## 2019-03-30 MED ORDER — PREDNISONE 10 MG (21) PO TBPK: ORAL_TABLET | ORAL | 0 refills | Status: DC

## 2019-03-30 MED ORDER — METFORMIN HCL 500 MG PO TABS: 500.0000 mg | ORAL_TABLET | Freq: Two times a day (BID) | ORAL | 1 refills | Status: DC

## 2019-03-30 MED ORDER — LOSARTAN POTASSIUM 50 MG PO TABS: 50.0000 mg | ORAL_TABLET | Freq: Every day | ORAL | 1 refills | Status: DC

## 2019-03-30 MED ORDER — ATORVASTATIN CALCIUM 40 MG PO TABS: 40.0000 mg | ORAL_TABLET | Freq: Every day | ORAL | 1 refills | Status: DC

## 2019-03-30 MED ORDER — HYDROCHLOROTHIAZIDE 25 MG PO TABS: 25.0000 mg | ORAL_TABLET | Freq: Every day | ORAL | 1 refills | Status: DC

## 2019-03-30 MED ORDER — AMLODIPINE BESYLATE 10 MG PO TABS: 10.0000 mg | ORAL_TABLET | Freq: Every day | ORAL | 1 refills | Status: DC

## 2019-03-31 ENCOUNTER — Other Ambulatory Visit: Payer: Self-pay | Admitting: Nurse Practitioner

## 2019-03-31 DIAGNOSIS — L237 Allergic contact dermatitis due to plants, except food: Secondary | ICD-10-CM

## 2019-04-02 ENCOUNTER — Telehealth: Payer: Self-pay | Admitting: Nurse Practitioner

## 2019-04-02 NOTE — Telephone Encounter (Signed)
Wife aware meds have been sent to pharmacy

## 2019-04-18 ENCOUNTER — Ambulatory Visit (INDEPENDENT_AMBULATORY_CARE_PROVIDER_SITE_OTHER): Payer: Managed Care, Other (non HMO) | Admitting: Family

## 2019-04-18 ENCOUNTER — Encounter: Payer: Self-pay | Admitting: Family

## 2019-04-18 DIAGNOSIS — R21 Rash and other nonspecific skin eruption: Secondary | ICD-10-CM | POA: Diagnosis not present

## 2019-04-18 MED ORDER — TRIAMCINOLONE ACETONIDE 0.5 % EX OINT: 1.0000 "application " | TOPICAL_OINTMENT | Freq: Two times a day (BID) | CUTANEOUS | 0 refills | Status: DC

## 2019-04-18 NOTE — Progress Notes (Signed)
Virtual Visit via telephone Note Due to COVID-19 pandemic this visit was conducted virtually. This visit type was conducted due to national recommendations for restrictions regarding the COVID-19 Pandemic (e.g. social distancing, sheltering in place) in an effort to limit this patient's exposure and mitigate transmission in our community. All issues noted in this document were discussed and addressed.  A physical exam was not performed with this format.  I connected with Jonathon Adkins on 04/18/19 at 8:42 AM by telephone and verified that I am speaking with the correct person using two identifiers. Jonathon Adkins is currently located at home and no one is currently with him during visit. The provider, Evelina Dun, FNP is located in their office at time of visit.  I discussed the limitations, risks, security and privacy concerns of performing an evaluation and management service by telephone and the availability of in person appointments. I also discussed with the patient that there may be a patient responsible charge related to this service. The patient expressed understanding and agreed to proceed.   History and Present Illness:  Pt calls the office today with recurrent rash left arm. He states he was cutting a tree down in March and got in poison  ivy. He  States he has got steroid medication then and it returned. He called the office on 02/19/19 and received prednisone for 5 days and Hydroxyzine. He then states the rash returned and he called on 04/02/19 and this was refilled again. He states he rash improves while taking the prednisone, but after he stops it returns about a week after stopping the prednisone.  Rash This is a recurrent problem. The current episode started more than 1 month ago. The affected locations include the left arm. The rash is characterized by itchiness and redness. Past treatments include topical steroids, oral steroids and anti-itch cream. The treatment provided mild  relief.      Review of Systems  Skin: Positive for rash.  All other systems reviewed and are negative.    Observations/Objective: No SOB or distress   Assessment and Plan: 1. Rash and nonspecific skin eruption Does sound like some type of contact dermatis, however, worrisome that it has been going on for 5 months now in the same place. I will place referral to derm today.  I will hold off on oral prednisone as he just completed some a few weeks ago.  Do not scratch Wear long clothing while outside Call if rash worsens or does not improve  - triamcinolone ointment (KENALOG) 0.5 %; Apply 1 application topically 2 (two) times daily.  Dispense: 30 g; Refill: 0 - Ambulatory referral to Dermatology     I discussed the assessment and treatment plan with the patient. The patient was provided an opportunity to ask questions and all were answered. The patient agreed with the plan and demonstrated an understanding of the instructions.   The patient was advised to call back or seek an in-person evaluation if the symptoms worsen or if the condition fails to improve as anticipated.  The above assessment and management plan was discussed with the patient. The patient verbalized understanding of and has agreed to the management plan. Patient is aware to call the clinic if symptoms persist or worsen. Patient is aware when to return to the clinic for a follow-up visit. Patient educated on when it is appropriate to go to the emergency department.   Time call ended:  9: 01 AM  I provided 19 minutes of non-face-to-face  time during this encounter.    Evelina Dun, FNP

## 2019-04-23 ENCOUNTER — Other Ambulatory Visit: Payer: Self-pay | Admitting: Nurse Practitioner

## 2019-04-23 DIAGNOSIS — N400 Enlarged prostate without lower urinary tract symptoms: Secondary | ICD-10-CM

## 2019-07-02 ENCOUNTER — Ambulatory Visit: Payer: Self-pay | Admitting: Nurse Practitioner

## 2019-07-09 ENCOUNTER — Other Ambulatory Visit: Payer: Self-pay

## 2019-07-10 ENCOUNTER — Encounter: Payer: Self-pay | Admitting: Nurse Practitioner

## 2019-07-10 ENCOUNTER — Ambulatory Visit (INDEPENDENT_AMBULATORY_CARE_PROVIDER_SITE_OTHER): Payer: Medicare HMO | Admitting: Nurse Practitioner

## 2019-07-10 VITALS — BP 149/81 | HR 104 | Temp 99.6°F | Resp 20 | Ht 73.0 in | Wt 217.0 lb

## 2019-07-10 DIAGNOSIS — E782 Mixed hyperlipidemia: Secondary | ICD-10-CM

## 2019-07-10 DIAGNOSIS — L247 Irritant contact dermatitis due to plants, except food: Secondary | ICD-10-CM | POA: Diagnosis not present

## 2019-07-10 DIAGNOSIS — E119 Type 2 diabetes mellitus without complications: Secondary | ICD-10-CM

## 2019-07-10 DIAGNOSIS — I10 Essential (primary) hypertension: Secondary | ICD-10-CM | POA: Diagnosis not present

## 2019-07-10 DIAGNOSIS — Z6832 Body mass index (BMI) 32.0-32.9, adult: Secondary | ICD-10-CM

## 2019-07-10 DIAGNOSIS — R21 Rash and other nonspecific skin eruption: Secondary | ICD-10-CM

## 2019-07-10 DIAGNOSIS — N400 Enlarged prostate without lower urinary tract symptoms: Secondary | ICD-10-CM | POA: Diagnosis not present

## 2019-07-10 LAB — BAYER DCA HB A1C WAIVED: HB A1C (BAYER DCA - WAIVED): 7.1 % — ABNORMAL HIGH (ref <7.0)

## 2019-07-10 MED ORDER — AMLODIPINE BESYLATE 10 MG PO TABS: 10.0000 mg | ORAL_TABLET | Freq: Every day | ORAL | 1 refills | Status: DC

## 2019-07-10 MED ORDER — TRIAMCINOLONE ACETONIDE 0.1 % EX CREA: 1.0000 "application " | TOPICAL_CREAM | Freq: Two times a day (BID) | CUTANEOUS | 0 refills | Status: DC

## 2019-07-10 MED ORDER — HYDROCHLOROTHIAZIDE 25 MG PO TABS: 25.0000 mg | ORAL_TABLET | Freq: Every day | ORAL | 1 refills | Status: DC

## 2019-07-10 MED ORDER — PREDNISONE 10 MG (48) PO TBPK: ORAL_TABLET | ORAL | 0 refills | Status: DC

## 2019-07-10 MED ORDER — LOSARTAN POTASSIUM 50 MG PO TABS: 50.0000 mg | ORAL_TABLET | Freq: Every day | ORAL | 1 refills | Status: DC

## 2019-07-10 MED ORDER — FINASTERIDE 5 MG PO TABS: 5.0000 mg | ORAL_TABLET | Freq: Every day | ORAL | 0 refills | Status: DC

## 2019-07-10 MED ORDER — METFORMIN HCL 500 MG PO TABS: 500.0000 mg | ORAL_TABLET | Freq: Two times a day (BID) | ORAL | 1 refills | Status: DC

## 2019-07-10 MED ORDER — ATORVASTATIN CALCIUM 40 MG PO TABS: 40.0000 mg | ORAL_TABLET | Freq: Every day | ORAL | 1 refills | Status: DC

## 2019-07-10 NOTE — Progress Notes (Signed)
Subjective:    Patient ID: Jonathon Adkins, male    DOB: 1951/06/30, 68 y.o.   MRN: [id number]   Chief Complaint: Medical Management of Chronic Issues    HPI:  1. Essential hypertension No c/o chest pain, sob or headache. Does not check blood pressure at home. BP Readings from Last 3 Encounters:  07/10/19 (!) 149/81  11/11/18 135/75  09/18/18 136/86     2. Mixed hyperlipidemia Tries to watch diet and stays very active. Lab Results  Component Value Date   CHOL 137 09/18/2018   HDL 57 09/18/2018   LDLCALC 67 09/18/2018   TRIG 65 09/18/2018   CHOLHDL 2.4 09/18/2018    3. Type 2 diabetes mellitus without complication, without long-term current use of insulin (HCC) Fasting blood sugars are not checked. He denies any ow blood sugar symptoms. Lab Results  Component Value Date   HGBA1C 6.8 09/18/2018     4. Benign prostatic hyperplasia without lower urinary tract symptoms No problems voiding- is on proscar daily  5. BMI 32.0-32.9,adult No recent weight changes Wt Readings from Last 3 Encounters:  07/10/19 217 lb (98.4 kg)  11/11/18 226 lb 12.8 oz (102.9 kg)  09/18/18 225 lb (102.1 kg)    BMI Readings from Last 3 Encounters:  07/10/19 28.63 kg/m  11/11/18 29.92 kg/m  09/18/18 29.69 kg/m       Outpatient Encounter Medications as of 07/10/2019  Medication Sig  . amLODipine (NORVASC) 10 MG tablet Take 1 tablet (10 mg total) by mouth daily.  Marland Kitchen atorvastatin (LIPITOR) 40 MG tablet Take 1 tablet (40 mg total) by mouth daily.  . cholecalciferol (VITAMIN D) 1000 UNITS tablet Take 1 tablet (1,000 Units total) by mouth daily.  . finasteride (PROSCAR) 5 MG tablet Take 1 tablet by mouth once daily  . hydrochlorothiazide (HYDRODIURIL) 25 MG tablet Take 1 tablet (25 mg total) by mouth daily.  . hydrOXYzine (ATARAX/VISTARIL) 25 MG tablet TAKE 1 TABLET BY MOUTH THREE TIMES DAILY AS NEEDED FOR ITCHING  . losartan (COZAAR) 50 MG tablet Take 1 tablet (50 mg total) by mouth  daily.  . metFORMIN (GLUCOPHAGE) 500 MG tablet Take 1 tablet (500 mg total) by mouth 2 (two) times daily with a meal.  . triamcinolone ointment (KENALOG) 0.5 % Apply 1 application topically 2 (two) times daily.     Past Surgical History:  Procedure Laterality Date  . COLONOSCOPY N/A 02/06/2014   Procedure: COLONOSCOPY;  Surgeon: Rogene Houston, MD;  Location: [facility];  Service: Endoscopy;  Laterality: N/A;  100    Family History  Problem Relation Age of Onset  . Diabetes Mother   . Coronary artery disease Father     New complaints: Has had poison oak for 6 months. He has had one steroid shot back in march and is no better.  Social history: Lives with wife. Has recently been layed off from work.  Controlled substance contract: n/a    Review of Systems  Constitutional: Negative for activity change and appetite change.  HENT: Negative.   Eyes: Negative for pain.  Respiratory: Negative for shortness of breath.   Cardiovascular: Negative for chest pain, palpitations and leg swelling.  Gastrointestinal: Negative for abdominal pain.  Endocrine: Negative for polydipsia.  Genitourinary: Negative.   Skin: Negative for rash.  Neurological: Negative for dizziness, weakness and headaches.  Hematological: Does not bruise/bleed easily.  Psychiatric/Behavioral: Negative.   All other systems reviewed and are negative.      Objective:   Physical  Exam Vitals signs and nursing note reviewed.  Constitutional:      Appearance: Normal appearance. He is well-developed.  HENT:     Head: Normocephalic.     Nose: Nose normal.  Eyes:     Pupils: Pupils are equal, round, and reactive to light.  Neck:     Musculoskeletal: Normal range of motion and neck supple.     Thyroid: No thyroid mass or thyromegaly.     Vascular: No carotid bruit or JVD.     Trachea: Phonation normal.  Cardiovascular:     Rate and Rhythm: Normal rate and regular rhythm.  Pulmonary:     Effort: Pulmonary  effort is normal. No respiratory distress.     Breath sounds: Normal breath sounds.  Abdominal:     General: Bowel sounds are normal.     Palpations: Abdomen is soft.     Tenderness: There is no abdominal tenderness.  Musculoskeletal: Normal range of motion.  Lymphadenopathy:     Cervical: No cervical adenopathy.  Skin:    General: Skin is warm and dry.     Comments: Vesicular rash on bil forearms and legs.  Neurological:     Mental Status: He is alert and oriented to person, place, and time.  Psychiatric:        Behavior: Behavior normal.        Thought Content: Thought content normal.        Judgment: Judgment normal.    BP (!) 149/81   Pulse (!) 104   Temp 99.6 F (37.6 C) (Temporal)   Resp 20   Ht _0  (1.854 m)   Wt 217 lb (98.4 kg)   SpO2 100%   BMI 28.63 kg/m   HGBA1C 7.1     Assessment & Plan:  Jonathon Adkins comes in today with chief complaint of Medical Management of Chronic Issues   Diagnosis and orders addressed:  1. Essential hypertension Low sodium diet - CMP14+EGFR - losartan (COZAAR) 50 MG tablet; Take 1 tablet (50 mg total) by mouth daily.  Dispense: 90 tablet; Refill: 1 - hydrochlorothiazide (HYDRODIURIL) 25 MG tablet; Take 1 tablet (25 mg total) by mouth daily.  Dispense: 90 tablet; Refill: 1 - amLODipine (NORVASC) 10 MG tablet; Take 1 tablet (10 mg total) by mouth daily.  Dispense: 90 tablet; Refill: 1  2. Mixed hyperlipidemia Low fat diet - Lipid panel - atorvastatin (LIPITOR) 40 MG tablet; Take 1 tablet (40 mg total) by mouth daily.  Dispense: 90 tablet; Refill: 1  3. Type 2 diabetes mellitus without complication, without long-term current use of insulin (HCC) Continue to watch carbs in diet - hgba1c - Microalbumin / creatinine urine ratio - metFORMIN (GLUCOPHAGE) 500 MG tablet; Take 1 tablet (500 mg total) by mouth 2 (two) times daily with a meal.  Dispense: 180 tablet; Refill: 1  4. Benign prostatic hyperplasia without lower urinary  tract symptoms - finasteride (PROSCAR) 5 MG tablet; Take 1 tablet (5 mg total) by mouth daily.  Dispense: 90 tablet; Refill: 0  5. BMI 32.0-32.9,adult Discussed diet and exercise for person with BMI >25 Will recheck weight in 3-6 months  6. Irritant contact dermatitis due to plants, except food Avoid scratching Watch carb diet especially hile on steroids - predniSONE (STERAPRED UNI-PAK 48 TAB) 10 MG (48) TBPK tablet; As directed on back of pack  Dispense: 48 tablet; Refill: 0   Labs pending Health Maintenance reviewed Diet and exercise encouraged  Follow up plan: 3 months  [name] Done, FNP

## 2019-07-10 NOTE — Addendum Note (Signed)
Addended by: Chevis Pretty on: 07/10/2019 11:10 AM   Modules accepted: Orders

## 2019-07-10 NOTE — Patient Instructions (Signed)
Contact Dermatitis Dermatitis is redness, soreness, and swelling (inflammation) of the skin. Contact dermatitis is a reaction to something that touches the skin. There are two types of contact dermatitis:  Irritant contact dermatitis. This happens when something bothers (irritates) your skin, like soap.  Allergic contact dermatitis. This is caused when you are exposed to something that you are allergic to, such as poison ivy. What are the causes?  Common causes of irritant contact dermatitis include: ? Makeup. ? Soaps. ? Detergents. ? Bleaches. ? Acids. ? Metals, such as nickel.  Common causes of allergic contact dermatitis include: ? Plants. ? Chemicals. ? Jewelry. ? Latex. ? Medicines. ? Preservatives in products, such as clothing. What increases the risk?  Having a job that exposes you to things that bother your skin.  Having asthma or eczema. What are the signs or symptoms? Symptoms may happen anywhere the irritant has touched your skin. Symptoms include:  Dry or flaky skin.  Redness.  Cracks.  Itching.  Pain or a burning feeling.  Blisters.  Blood or clear fluid draining from skin cracks. With allergic contact dermatitis, swelling may occur. This may happen in places such as the eyelids, mouth, or genitals. How is this treated?  This condition is treated by checking for the cause of the reaction and protecting your skin. Treatment may also include: ? Steroid creams, ointments, or medicines. ? Antibiotic medicines or other ointments, if you have a skin infection. ? Lotion or medicines to help with itching. ? A bandage (dressing). Follow these instructions at home: Skin care  Moisturize your skin as needed.  Put cool cloths on your skin.  Put a baking soda paste on your skin. Stir water into baking soda until it looks like a paste.  Do not scratch your skin.  Avoid having things rub up against your skin.  Avoid the use of soaps, perfumes, and dyes.  Medicines  Take or apply over-the-counter and prescription medicines only as told by your doctor.  If you were prescribed an antibiotic medicine, take or apply it as told by your doctor. Do not stop using it even if your condition starts to get better. Bathing  Take a bath with: ? Epsom salts. ? Baking soda. ? Colloidal oatmeal.  Bathe less often.  Bathe in warm water. Avoid using hot water. Bandage care  If you were given a bandage, change it as told by your health care provider.  Wash your hands with soap and water before and after you change your bandage. If soap and water are not available, use hand sanitizer. General instructions  Avoid the things that caused your reaction. If you do not know what caused it, keep a journal. Write down: ? What you eat. ? What skin products you use. ? What you drink. ? What you wear in the area that has symptoms. This includes jewelry.  Check the affected areas every day for signs of infection. Check for: ? More redness, swelling, or pain. ? More fluid or blood. ? Warmth. ? Pus or a bad smell.  Keep all follow-up visits as told by your doctor. This is important. Contact a doctor if:  You do not get better with treatment.  Your condition gets worse.  You have signs of infection, such as: ? More swelling. ? Tenderness. ? More redness. ? Soreness. ? Warmth.  You have a fever.  You have new symptoms. Get help right away if:  You have a very bad headache.  You have neck pain.    Your neck is stiff.  You throw up (vomit).  You feel very sleepy.  You see red streaks coming from the area.  Your bone or joint near the area hurts after the skin has healed.  The area turns darker.  You have trouble breathing. Summary  Dermatitis is redness, soreness, and swelling of the skin.  Symptoms may occur where the irritant has touched you.  Treatment may include medicines and skin care.  If you do not know what caused your  reaction, keep a journal.  Contact a doctor if your condition gets worse or you have signs of infection. This information is not intended to replace advice given to you by your health care provider. Make sure you discuss any questions you have with your health care provider. Document Released: 06/06/2009 Document Revised: 11/29/2018 Document Reviewed: 02/22/2018 Elsevier Patient Education  2020 Elsevier Inc[employer]  

## 2019-07-11 LAB — CMP14+EGFR
ALT: 13 IU/L (ref 0–44)
AST: 13 IU/L (ref 0–40)
Albumin/Globulin Ratio: 1.4 (ref 1.2–2.2)
Albumin: 4.3 g/dL (ref 3.8–4.8)
Alkaline Phosphatase: 88 IU/L (ref 39–117)
BUN/Creatinine Ratio: 12 (ref 10–24)
BUN: 14 mg/dL (ref 8–27)
Bilirubin Total: 1 mg/dL (ref 0.0–1.2)
CO2: 20 mmol/L (ref 20–29)
Calcium: 9.6 mg/dL (ref 8.6–10.2)
Chloride: 97 mmol/L (ref 96–106)
Creatinine, Ser: 1.19 mg/dL (ref 0.76–1.27)
GFR calc Af Amer: 71 mL/min/{1.73_m2} (ref >59)
GFR calc non Af Amer: 62 mL/min/{1.73_m2} (ref >59)
Globulin, Total: 3 g/dL (ref 1.5–4.5)
Glucose: 147 mg/dL — ABNORMAL HIGH (ref 65–99)
Potassium: 4 mmol/L (ref 3.5–5.2)
Sodium: 138 mmol/L (ref 134–144)
Total Protein: 7.3 g/dL (ref 6.0–8.5)

## 2019-07-11 LAB — LIPID PANEL
Chol/HDL Ratio: 2.8 ratio (ref 0.0–5.0)
Cholesterol, Total: 132 mg/dL (ref 100–199)
HDL: 47 mg/dL (ref >39)
LDL Chol Calc (NIH): 71 mg/dL (ref 0–99)
Triglycerides: 68 mg/dL (ref 0–149)
VLDL Cholesterol Cal: 14 mg/dL (ref 5–40)

## 2019-08-27 DIAGNOSIS — N401 Enlarged prostate with lower urinary tract symptoms: Secondary | ICD-10-CM | POA: Diagnosis not present

## 2019-08-31 DIAGNOSIS — R351 Nocturia: Secondary | ICD-10-CM | POA: Diagnosis not present

## 2019-08-31 DIAGNOSIS — N401 Enlarged prostate with lower urinary tract symptoms: Secondary | ICD-10-CM | POA: Diagnosis not present

## 2019-10-02 ENCOUNTER — Other Ambulatory Visit: Payer: Self-pay | Admitting: Nurse Practitioner

## 2019-10-02 DIAGNOSIS — N400 Enlarged prostate without lower urinary tract symptoms: Secondary | ICD-10-CM

## 2019-10-12 ENCOUNTER — Other Ambulatory Visit: Payer: Self-pay

## 2019-10-12 ENCOUNTER — Telehealth: Payer: Self-pay | Admitting: Nurse Practitioner

## 2019-10-12 ENCOUNTER — Ambulatory Visit: Payer: Self-pay | Admitting: Nurse Practitioner

## 2019-10-15 ENCOUNTER — Encounter: Payer: Self-pay | Admitting: Nurse Practitioner

## 2019-10-15 ENCOUNTER — Ambulatory Visit (INDEPENDENT_AMBULATORY_CARE_PROVIDER_SITE_OTHER): Payer: Medicare HMO | Admitting: Nurse Practitioner

## 2019-10-15 ENCOUNTER — Other Ambulatory Visit: Payer: Self-pay

## 2019-10-15 VITALS — BP 133/78 | HR 104 | Temp 98.0°F | Resp 20 | Ht 73.0 in | Wt 222.0 lb

## 2019-10-15 DIAGNOSIS — E782 Mixed hyperlipidemia: Secondary | ICD-10-CM

## 2019-10-15 DIAGNOSIS — E119 Type 2 diabetes mellitus without complications: Secondary | ICD-10-CM | POA: Diagnosis not present

## 2019-10-15 DIAGNOSIS — R21 Rash and other nonspecific skin eruption: Secondary | ICD-10-CM

## 2019-10-15 DIAGNOSIS — N400 Enlarged prostate without lower urinary tract symptoms: Secondary | ICD-10-CM | POA: Diagnosis not present

## 2019-10-15 DIAGNOSIS — I10 Essential (primary) hypertension: Secondary | ICD-10-CM | POA: Diagnosis not present

## 2019-10-15 DIAGNOSIS — Z6832 Body mass index (BMI) 32.0-32.9, adult: Secondary | ICD-10-CM

## 2019-10-15 LAB — BAYER DCA HB A1C WAIVED: HB A1C (BAYER DCA - WAIVED): 7.7 % — ABNORMAL HIGH (ref <7.0)

## 2019-10-15 MED ORDER — ATORVASTATIN CALCIUM 40 MG PO TABS: 40.0000 mg | ORAL_TABLET | Freq: Every day | ORAL | 1 refills | Status: DC

## 2019-10-15 MED ORDER — AMLODIPINE BESYLATE 10 MG PO TABS: 10.0000 mg | ORAL_TABLET | Freq: Every day | ORAL | 1 refills | Status: DC

## 2019-10-15 MED ORDER — HYDROCHLOROTHIAZIDE 25 MG PO TABS: 25.0000 mg | ORAL_TABLET | Freq: Every day | ORAL | 1 refills | Status: DC

## 2019-10-15 MED ORDER — LOSARTAN POTASSIUM 50 MG PO TABS: 50.0000 mg | ORAL_TABLET | Freq: Every day | ORAL | 1 refills | Status: DC

## 2019-10-15 MED ORDER — TRIAMCINOLONE ACETONIDE 0.1 % EX CREA: 1.0000 "application " | TOPICAL_CREAM | Freq: Two times a day (BID) | CUTANEOUS | 0 refills | Status: DC

## 2019-10-15 MED ORDER — METFORMIN HCL 500 MG PO TABS: 500.0000 mg | ORAL_TABLET | Freq: Two times a day (BID) | ORAL | 1 refills | Status: DC

## 2019-10-15 NOTE — Patient Instructions (Signed)

## 2019-10-15 NOTE — Progress Notes (Signed)
Subjective:    Patient ID: Jonathon Adkins, male    DOB: May 19, 1951, 69 y.o.   MRN: [id number]   Chief Complaint: Medical Management of Chronic Issues    HPI:  1. Essential hypertension No c/o chest pain, sob or headache. Does not check blood pressure at home.  BP Readings from Last 3 Encounters:  07/10/19 (!) 149/81  11/11/18 135/75  09/18/18 136/86     2. Mixed hyperlipidemia Does not watch diet and does very little exercise.' Lab Results  Component Value Date   CHOL 132 07/10/2019   HDL 47 07/10/2019   LDLCALC 71 07/10/2019   TRIG 68 07/10/2019   CHOLHDL 2.8 07/10/2019     3. Type 2 diabetes mellitus without complication, without long-term current use of insulin ([place]) He has not been checking his blood sugars at home. He says he can fill when they are high and he has been feeling good. Lab Results  Component Value Date   HGBA1C 7.1 (H) 07/10/2019     4. Benign prostatic hyperplasia without lower urinary tract symptoms Denies any issues voiding. Takes proscar daily. Saw urologist in January and told him all was well and he wold see him in a year Lab Results  Component Value Date   PSA1 3.0 11/01/2016    5. BMI 32.0-32.9 No recent weight changes. Wt Readings from Last 3 Encounters:  10/15/19 222 lb (100.7 kg)  07/10/19 217 lb (98.4 kg)  11/11/18 226 lb 12.8 oz (102.9 kg)   BMI Readings from Last 3 Encounters:  10/15/19 29.29 kg/m  07/10/19 28.63 kg/m  11/11/18 29.92 kg/m       Outpatient Encounter Medications as of 10/15/2019  Medication Sig  . amLODipine (NORVASC) 10 MG tablet Take 1 tablet (10 mg total) by mouth daily.  Marland Kitchen atorvastatin (LIPITOR) 40 MG tablet Take 1 tablet (40 mg total) by mouth daily.  . cholecalciferol (VITAMIN D) 1000 UNITS tablet Take 1 tablet (1,000 Units total) by mouth daily.  . finasteride (PROSCAR) 5 MG tablet TAKE 1 TABLET BY MOUTH EVERY DAY  . hydrochlorothiazide (HYDRODIURIL) 25 MG tablet Take 1 tablet (25 mg total) by  mouth daily.  . hydrOXYzine (ATARAX/VISTARIL) 25 MG tablet TAKE 1 TABLET BY MOUTH THREE TIMES DAILY AS NEEDED FOR ITCHING  . losartan (COZAAR) 50 MG tablet Take 1 tablet (50 mg total) by mouth daily.  . metFORMIN (GLUCOPHAGE) 500 MG tablet Take 1 tablet (500 mg total) by mouth 2 (two) times daily with a meal.  . predniSONE (STERAPRED UNI-PAK 48 TAB) 10 MG (48) TBPK tablet As directed on back of pack  . triamcinolone cream (KENALOG) 0.1 % Apply 1 application topically 2 (two) times daily.    Past Surgical History:  Procedure Laterality Date  . COLONOSCOPY N/A 02/06/2014   Procedure: COLONOSCOPY;  Surgeon: Rogene Houston, MD;  Location: [facility];  Service: Endoscopy;  Laterality: N/A;  100    Family History  Problem Relation Age of Onset  . Diabetes Mother   . Coronary artery disease Father     New complaints: Still has rash that he thought was from poison oak. He has had it for greater then 6 months. The only thing that helps it is prednisone.  Social history: lives with wife. Is still working  Controlled substance contract: n/a     Review of Systems     Objective:   Physical Exam Vitals and nursing note reviewed.  Constitutional:      Appearance: Normal appearance.  He is well-developed.  HENT:     Head: Normocephalic.     Nose: Nose normal.  Eyes:     Pupils: Pupils are equal, round, and reactive to light.  Neck:     Thyroid: No thyroid mass or thyromegaly.     Vascular: No carotid bruit or JVD.     Trachea: Phonation normal.     Comments: Large nontender lipoma at base of neck Cardiovascular:     Rate and Rhythm: Normal rate and regular rhythm.  Pulmonary:     Effort: Pulmonary effort is normal. No respiratory distress.     Breath sounds: Normal breath sounds.  Abdominal:     General: Bowel sounds are normal.     Palpations: Abdomen is soft.     Tenderness: There is no abdominal tenderness.  Musculoskeletal:        General: Normal range of motion.      Cervical back: Normal range of motion and neck supple.  Lymphadenopathy:     Cervical: No cervical adenopathy.  Skin:    General: Skin is warm and dry.     Findings: Rash (purplish scaley rash on bil forarms.) present.  Neurological:     Mental Status: He is alert and oriented to person, place, and time.  Psychiatric:        Behavior: Behavior normal.        Thought Content: Thought content normal.        Judgment: Judgment normal.     BP 133/78   Pulse (!) 104   Temp 98 F (36.7 C) (Temporal)   Resp 20   Ht _0  (1.854 m)   Wt 222 lb (100.7 kg)   SpO2 99%   BMI 29.29 kg/m   Hgab1c 7.7%      Assessment & Plan:  Jonathon Adkins comes in today with chief complaint of Medical Management of Chronic Issues   Diagnosis and orders addressed:  1. Essential hypertension Low sodium diet - CMP14+EGFR - CBC with Differential/Platelet - losartan (COZAAR) 50 MG tablet; Take 1 tablet (50 mg total) by mouth daily.  Dispense: 90 tablet; Refill: 1 - hydrochlorothiazide (HYDRODIURIL) 25 MG tablet; Take 1 tablet (25 mg total) by mouth daily.  Dispense: 90 tablet; Refill: 1 - amLODipine (NORVASC) 10 MG tablet; Take 1 tablet (10 mg total) by mouth daily.  Dispense: 90 tablet; Refill: 1  2. Mixed hyperlipidemia Low fat diet - Lipid panel - atorvastatin (LIPITOR) 40 MG tablet; Take 1 tablet (40 mg total) by mouth daily.  Dispense: 90 tablet; Refill: 1  3. Type 2 diabetes mellitus without complication, without long-term current use of insulin (HCC) Stricter carb counting - Bayer DCA Hb A1c Waived - Microalbumin / creatinine urine ratio - metFORMIN (GLUCOPHAGE) 500 MG tablet; Take 1 tablet (500 mg total) by mouth 2 (two) times daily with a meal.  Dispense: 180 tablet; Refill: 1  4. Benign prostatic hyperplasia without lower urinary tract symptoms - PSA, total and free  5. BMI 32.0-32.9,adult Discussed diet and exercise for person with BMI >25 Will recheck weight in 3-6 months  6.  Rash and nonspecific skin eruption Avoid scratching - Ambulatory referral to Dermatology - triamcinolone cream (KENALOG) 0.1 %; Apply 1 application topically 2 (two) times daily.  Dispense: 453 g; Refill: 0   Labs pending Health Maintenance reviewed Diet and exercise encouraged  Follow up plan: 3 moinths   [name] Done, FNP

## 2019-10-16 LAB — LIPID PANEL
Chol/HDL Ratio: 3 ratio (ref 0.0–5.0)
Cholesterol, Total: 130 mg/dL (ref 100–199)
HDL: 44 mg/dL (ref >39)
LDL Chol Calc (NIH): 67 mg/dL (ref 0–99)
Triglycerides: 103 mg/dL (ref 0–149)
VLDL Cholesterol Cal: 19 mg/dL (ref 5–40)

## 2019-10-16 LAB — CBC WITH DIFFERENTIAL/PLATELET
Basophils Absolute: 0.1 10*3/uL (ref 0.0–0.2)
Basos: 1 %
EOS (ABSOLUTE): 0.5 10*3/uL — ABNORMAL HIGH (ref 0.0–0.4)
Eos: 8 %
Hematocrit: 40 % (ref 37.5–51.0)
Hemoglobin: 13.6 g/dL (ref 13.0–17.7)
Immature Grans (Abs): 0 10*3/uL (ref 0.0–0.1)
Immature Granulocytes: 0 %
Lymphocytes Absolute: 1.9 10*3/uL (ref 0.7–3.1)
Lymphs: 32 %
MCH: 29.7 pg (ref 26.6–33.0)
MCHC: 34 g/dL (ref 31.5–35.7)
MCV: 87 fL (ref 79–97)
Monocytes Absolute: 0.4 10*3/uL (ref 0.1–0.9)
Monocytes: 7 %
Neutrophils Absolute: 3 10*3/uL (ref 1.4–7.0)
Neutrophils: 52 %
Platelets: 280 10*3/uL (ref 150–450)
RBC: 4.58 x10E6/uL (ref 4.14–5.80)
RDW: 13.2 % (ref 11.6–15.4)
WBC: 5.7 10*3/uL (ref 3.4–10.8)

## 2019-10-16 LAB — CMP14+EGFR
ALT: 13 IU/L (ref 0–44)
AST: 12 IU/L (ref 0–40)
Albumin/Globulin Ratio: 1.5 (ref 1.2–2.2)
Albumin: 4.1 g/dL (ref 3.8–4.8)
Alkaline Phosphatase: 92 IU/L (ref 39–117)
BUN/Creatinine Ratio: 13 (ref 10–24)
BUN: 16 mg/dL (ref 8–27)
Bilirubin Total: 1 mg/dL (ref 0.0–1.2)
CO2: 23 mmol/L (ref 20–29)
Calcium: 9.7 mg/dL (ref 8.6–10.2)
Chloride: 102 mmol/L (ref 96–106)
Creatinine, Ser: 1.23 mg/dL (ref 0.76–1.27)
GFR calc Af Amer: 68 mL/min/{1.73_m2} (ref >59)
GFR calc non Af Amer: 59 mL/min/{1.73_m2} — ABNORMAL LOW (ref >59)
Globulin, Total: 2.7 g/dL (ref 1.5–4.5)
Glucose: 156 mg/dL — ABNORMAL HIGH (ref 65–99)
Potassium: 4.1 mmol/L (ref 3.5–5.2)
Sodium: 138 mmol/L (ref 134–144)
Total Protein: 6.8 g/dL (ref 6.0–8.5)

## 2019-10-16 LAB — PSA, TOTAL AND FREE
PSA, Free Pct: 26 %
PSA, Free: 0.78 ng/mL
Prostate Specific Ag, Serum: 3 ng/mL (ref 0.0–4.0)

## 2019-12-06 ENCOUNTER — Other Ambulatory Visit: Payer: Self-pay

## 2019-12-06 ENCOUNTER — Ambulatory Visit: Payer: Medicare HMO | Admitting: Physician Assistant

## 2019-12-06 ENCOUNTER — Encounter: Payer: Self-pay | Admitting: Physician Assistant

## 2019-12-06 DIAGNOSIS — R21 Rash and other nonspecific skin eruption: Secondary | ICD-10-CM | POA: Diagnosis not present

## 2019-12-06 MED ORDER — DOXYCYCLINE MONOHYDRATE 100 MG PO CAPS: 100.0000 mg | ORAL_CAPSULE | Freq: Two times a day (BID) | ORAL | 0 refills | Status: DC

## 2019-12-06 NOTE — Progress Notes (Signed)
   Follow-Up Visit   Subjective  Jonathon Adkins is a 69 y.o. male who presents for the following: Jonathon Adkins (since March 2020- arms, back, chest, lets and scalp- Tx-TAC cream & hydroxyzine- oral prednison( Pt  has diabetes)). Steroids made his blood sugar go really high.    Location:  Scalp, arms, legs, abdomen, had 2 spots on his scrotum that went away. Duration: 75yr plus Quality: pink patches with white bumps Associated Signs/Symptoms: itches Modifying Factors: TAC Severity: not much improvement Patient is not sexually active, no one else has this     The following portions of the chart were reviewed this encounter and updated as appropriate: Tobacco  Allergies  Meds  Problems  Med Hx  Surg Hx  Fam Hx    No changes in any system.  Pt. Feels good.  Objective  Well appearing patient in no apparent distress; mood and affect are within normal limits.  A full examination was performed including scalp, head, eyes, ears, nose, lips, neck, chest, axillae, abdomen, back, buttocks, bilateral upper extremities, bilateral lower extremities, hands, feet, fingers, toes, fingernails, and toenails. All findings within normal limits unless otherwise noted below.  Objective  Left Forearm - Anterior, Right Forearm - Anterior: Miliary papules on pink base   Assessment & Plan  Rash and other nonspecific skin eruption (2) Left Forearm - Anterior; Right Forearm - Anterior  Other Related Procedures Anaerobic and Aerobic Culture  Ordered Medications: doxycycline (MONODOX) 100 MG capsule

## 2019-12-12 ENCOUNTER — Telehealth: Payer: Self-pay | Admitting: *Deleted

## 2019-12-12 LAB — ANAEROBIC AND AEROBIC CULTURE
MICRO NUMBER:: 10367756
MICRO NUMBER:: 10367757
SPECIMEN QUALITY:: ADEQUATE
SPECIMEN QUALITY:: ADEQUATE

## 2019-12-12 NOTE — Telephone Encounter (Signed)
-----   Message from Warren Danes, [place] sent at 12/11/2019  4:28 PM EDT ----- negative

## 2019-12-12 NOTE — Telephone Encounter (Signed)
Informed patient that bacterial culture on the groin  was negative. He stated that groin was still itchy.

## 2019-12-18 ENCOUNTER — Ambulatory Visit (INDEPENDENT_AMBULATORY_CARE_PROVIDER_SITE_OTHER): Payer: Medicare HMO | Admitting: Physician Assistant

## 2019-12-18 ENCOUNTER — Other Ambulatory Visit: Payer: Self-pay

## 2019-12-18 ENCOUNTER — Encounter: Payer: Self-pay | Admitting: Physician Assistant

## 2019-12-18 DIAGNOSIS — Q819 Epidermolysis bullosa, unspecified: Secondary | ICD-10-CM

## 2019-12-18 DIAGNOSIS — R21 Rash and other nonspecific skin eruption: Secondary | ICD-10-CM

## 2019-12-18 DIAGNOSIS — L720 Epidermal cyst: Secondary | ICD-10-CM | POA: Diagnosis not present

## 2019-12-18 MED ORDER — TRIAMCINOLONE ACETONIDE 0.1 % EX CREA
1.0000 | TOPICAL_CREAM | Freq: Two times a day (BID) | CUTANEOUS | 11 refills | Status: DC | PRN
Start: 2019-12-18 — End: 2020-01-22

## 2019-12-18 NOTE — Patient Instructions (Signed)

## 2019-12-24 ENCOUNTER — Other Ambulatory Visit: Payer: Self-pay | Admitting: Nurse Practitioner

## 2019-12-24 DIAGNOSIS — N400 Enlarged prostate without lower urinary tract symptoms: Secondary | ICD-10-CM

## 2019-12-24 NOTE — Progress Notes (Signed)
   Follow-Up Visit   Subjective  Jonathon Adkins is a 69 y.o. AA male who presents for the following: Follow-up (scalp and right forearm flare itch & patient stated he has been doing yard wotk).   Location: arms and legs Duration: x years Quality: pink lesions with bumps and blisters Associated Signs/Symptoms: itches Modifying Factors: topical steroid creams Severity: stable    The following portions of the chart were reviewed this encounter and updated as appropriate: Tobacco  Allergies  Meds  Problems  Med Hx  Surg Hx  Fam Hx      Objective  Well appearing patient in no apparent distress; mood and affect are within normal limits.  A full examination was performed including scalp, head, eyes, ears, nose, lips, neck, chest, axillae, abdomen, back, buttocks, bilateral upper extremities, bilateral lower extremities, hands, feet, fingers, toes, fingernails, and toenails. All findings within normal limits unless otherwise noted below.  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); Right Forearm - Posterior (2); Mid Frontal Scalp; Mid Forehead (2)  doxycycline (MONODOX) 100 MG capsule - Left Forearm - Posterior (2), Mid Forehead (2), Mid Frontal Scalp, Right Forearm - Posterior (2)  Skin / nail biopsy - Left Forearm - Posterior, Right Forearm - Posterior Type of biopsy: tangential   Informed consent: discussed and consent obtained   Timeout: patient name, date of birth, surgical site, and procedure verified   Anesthesia: the lesion was anesthetized in a standard fashion   Anesthetic:  1% lidocaine w/ epinephrine 1-100,000 local infiltration Instrument used: flexible razor blade   Hemostasis achieved with: aluminum chloride and electrodesiccation   Outcome:  patient tolerated procedure well   Post-procedure details: wound care instructions given    Specimen 1 - Surgical pathology Differential Diagnosis:  Aquired EB, PCT, granulomatous dermatitis Check Margins: No Indurated patches with blistering and miliary pustules. Non drinker. (Michels solution)  Specimen 2 - Surgical pathology Differential Diagnosis:PCT,  bullous dz vs granulomatous Check Margins: No/Michels specimen Indurated patches with blistering and miliary pustules  Rash and nonspecific skin eruption  Other Related Medications triamcinolone cream (KENALOG) 0.1 %

## 2019-12-26 ENCOUNTER — Telehealth: Payer: Self-pay | Admitting: *Deleted

## 2019-12-26 NOTE — Telephone Encounter (Signed)
-----   Message from Warren Danes, [place] sent at 12/25/2019 12:55 PM EDT ----- Would you call Tobias Alexander for a blistering specialist at [facility] [place] and [place] for referral. Thank you.

## 2019-12-27 NOTE — Telephone Encounter (Signed)
Patient was given results over the phone. Sorry. Thank you

## 2020-01-02 ENCOUNTER — Telehealth: Payer: Self-pay | Admitting: Physician Assistant

## 2020-01-02 DIAGNOSIS — Q819 Epidermolysis bullosa, unspecified: Secondary | ICD-10-CM

## 2020-01-02 DIAGNOSIS — R21 Rash and other nonspecific skin eruption: Secondary | ICD-10-CM

## 2020-01-02 NOTE — Telephone Encounter (Signed)
Patient is calling to see if Robyne Askew, PA-C [facility]) has any information for him about the blisters on his body.  Patient said KRS told him to call today if he had not heard from her.  (Epic)

## 2020-01-03 NOTE — Telephone Encounter (Signed)
Returning call from Q about referral to [facility]

## 2020-01-03 NOTE — Telephone Encounter (Signed)
Per Robyne Askew called patient to let know we have sent referral to Dr Patrica Duel  @ [facility].

## 2020-01-03 NOTE — Telephone Encounter (Signed)
Phone call to patient told him that we sent in the referral to [facility] and they will contact him with the appointment time and date.

## 2020-01-03 NOTE — Telephone Encounter (Signed)
This is to check on his referral to [facility]. What is the status? Please call him

## 2020-01-22 ENCOUNTER — Encounter: Payer: Self-pay | Admitting: Nurse Practitioner

## 2020-01-22 ENCOUNTER — Other Ambulatory Visit: Payer: Self-pay

## 2020-01-22 ENCOUNTER — Ambulatory Visit (INDEPENDENT_AMBULATORY_CARE_PROVIDER_SITE_OTHER): Payer: Medicare HMO | Admitting: Nurse Practitioner

## 2020-01-22 VITALS — BP 146/81 | HR 86 | Temp 97.8°F | Resp 20 | Ht 73.0 in | Wt 214.0 lb

## 2020-01-22 DIAGNOSIS — E119 Type 2 diabetes mellitus without complications: Secondary | ICD-10-CM

## 2020-01-22 DIAGNOSIS — N400 Enlarged prostate without lower urinary tract symptoms: Secondary | ICD-10-CM

## 2020-01-22 DIAGNOSIS — I10 Essential (primary) hypertension: Secondary | ICD-10-CM | POA: Diagnosis not present

## 2020-01-22 DIAGNOSIS — E782 Mixed hyperlipidemia: Secondary | ICD-10-CM

## 2020-01-22 DIAGNOSIS — Z6832 Body mass index (BMI) 32.0-32.9, adult: Secondary | ICD-10-CM

## 2020-01-22 LAB — BAYER DCA HB A1C WAIVED: HB A1C (BAYER DCA - WAIVED): 6.8 % (ref <7.0)

## 2020-01-22 MED ORDER — FINASTERIDE 5 MG PO TABS: 5.0000 mg | ORAL_TABLET | Freq: Every day | ORAL | 1 refills | Status: DC

## 2020-01-22 MED ORDER — AMLODIPINE BESYLATE 10 MG PO TABS: 10.0000 mg | ORAL_TABLET | Freq: Every day | ORAL | 1 refills | Status: DC

## 2020-01-22 MED ORDER — HYDROCHLOROTHIAZIDE 25 MG PO TABS: 25.0000 mg | ORAL_TABLET | Freq: Every day | ORAL | 1 refills | Status: DC

## 2020-01-22 MED ORDER — LOSARTAN POTASSIUM 50 MG PO TABS: 50.0000 mg | ORAL_TABLET | Freq: Every day | ORAL | 1 refills | Status: DC

## 2020-01-22 MED ORDER — ATORVASTATIN CALCIUM 40 MG PO TABS: 40.0000 mg | ORAL_TABLET | Freq: Every day | ORAL | 1 refills | Status: DC

## 2020-01-22 MED ORDER — METFORMIN HCL 500 MG PO TABS: 500.0000 mg | ORAL_TABLET | Freq: Two times a day (BID) | ORAL | 1 refills | Status: DC

## 2020-01-22 NOTE — Progress Notes (Addendum)
Subjective:    Patient ID: Jonathon Adkins, male    DOB: May 10, 1951, 69 y.o.   MRN: [id number]   Chief Complaint: .med   HPI:  1. Essential hypertension No c/o chest pain, ob or headache. Does not check blood pressure at home. BP Readings from Last 3 Encounters:  10/15/19 133/78  07/10/19 (!) 149/81  11/11/18 135/75     2. Mixed hyperlipidemia tries to watch diet and does very little exercise. Lab Results  Component Value Date   CHOL 130 10/15/2019   HDL 44 10/15/2019   LDLCALC 67 10/15/2019   TRIG 103 10/15/2019   CHOLHDL 3.0 10/15/2019     3. Type 2 diabetes mellitus without complication, without long-term current use of insulin (HCC) Does not check blood sugars at home. He tries watching his diet. Lab Results  Component Value Date   HGBA1C 7.7 (H) 10/15/2019     4. Benign prostatic hyperplasia without lower urinary tract symptoms No problems voiding.  5. BMI 32.0-32.9,adult No recent weight changes Wt Readings from Last 3 Encounters:  01/22/20 214 lb (97.1 kg)  10/15/19 222 lb (100.7 kg)  07/10/19 217 lb (98.4 kg)   BMI Readings from Last 3 Encounters:  01/22/20 28.23 kg/m  10/15/19 29.29 kg/m  07/10/19 28.63 kg/m         Outpatient Encounter Medications as of 01/22/2020  Medication Sig  . amLODipine (NORVASC) 10 MG tablet Take 1 tablet (10 mg total) by mouth daily.  Marland Kitchen atorvastatin (LIPITOR) 40 MG tablet Take 1 tablet (40 mg total) by mouth daily.  . cholecalciferol (VITAMIN D) 1000 UNITS tablet Take 1 tablet (1,000 Units total) by mouth daily.  Marland Kitchen doxycycline (MONODOX) 100 MG capsule Take 1 capsule (100 mg total) by mouth 2 (two) times daily.  . finasteride (PROSCAR) 5 MG tablet TAKE 1 TABLET BY MOUTH EVERY DAY  . hydrochlorothiazide (HYDRODIURIL) 25 MG tablet Take 1 tablet (25 mg total) by mouth daily.  . hydrOXYzine (ATARAX/VISTARIL) 25 MG tablet TAKE 1 TABLET BY MOUTH THREE TIMES DAILY AS NEEDED FOR ITCHING  . losartan (COZAAR) 50 MG tablet  Take 1 tablet (50 mg total) by mouth daily.  . metFORMIN (GLUCOPHAGE) 500 MG tablet Take 1 tablet (500 mg total) by mouth 2 (two) times daily with a meal.  . triamcinolone cream (KENALOG) 0.1 % Apply 1 application topically 2 (two) times daily.  Marland Kitchen triamcinolone cream (KENALOG) 0.1 % Apply 1 application topically 2 (two) times daily as needed.    Past Surgical History:  Procedure Laterality Date  . COLONOSCOPY N/A 02/06/2014   Procedure: COLONOSCOPY;  Surgeon: Rogene Houston, MD;  Location: [facility];  Service: Endoscopy;  Laterality: N/A;  100    Family History  Problem Relation Age of Onset  . Diabetes Mother   . Coronary artery disease Father     New complaints: Has had a itchy rash on bil forearms for over 6 month. 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  Social history: Lives with his wife  Controlled substance contract: n/a    Review of Systems  Constitutional: Negative for diaphoresis.  Eyes: Negative for pain.  Respiratory: Negative for shortness of breath.   Cardiovascular: Negative for chest pain, palpitations and leg swelling.  Gastrointestinal: Negative for abdominal pain.  Endocrine: Negative for polydipsia.  Skin: Negative for rash.  Neurological: Negative for dizziness, weakness and headaches.  Hematological: Does not bruise/bleed  easily.  All other systems reviewed and are negative.      Objective:   Physical Exam Vitals and nursing note reviewed.  Constitutional:      Appearance: Normal appearance. He is well-developed.  HENT:     Head: Normocephalic.     Nose: Nose normal.  Eyes:     Pupils: Pupils are equal, round, and reactive to light.  Neck:     Thyroid: No thyroid mass or thyromegaly.     Vascular: No carotid bruit or JVD.     Trachea: Phonation normal.  Cardiovascular:     Rate and Rhythm: Normal rate and regular rhythm.  Pulmonary:      Effort: Pulmonary effort is normal. No respiratory distress.     Breath sounds: Normal breath sounds.  Abdominal:     General: Bowel sounds are normal.     Palpations: Abdomen is soft.     Tenderness: There is no abdominal tenderness.  Musculoskeletal:        General: Normal range of motion.     Cervical back: Normal range of motion and neck supple.  Lymphadenopathy:     Cervical: No cervical adenopathy.  Skin:    General: Skin is warm and dry.     Comments: Vesicular leions scattered on bil forearm  Neurological:     Mental Status: He is alert and oriented to person, place, and time.  Psychiatric:        Behavior: Behavior normal.        Thought Content: Thought content normal.        Judgment: Judgment normal.    BP (!) 146/81   Pulse 86   Temp 97.8 F (36.6 C) (Temporal)   Resp 20   Ht '6\' 1"'$  (1.854 m)   Wt 214 lb (97.1 kg)   SpO2 100%   BMI 28.23 kg/m   hgba1c 6.8%      Assessment & Plan:  Jonathon Adkins comes in today with chief complaint of Medical Management of Chronic Issues   Diagnosis and orders addressed:  1. Essential hypertension Low sodium diet - CBC with Differential/Platelet - CMP14+EGFR - losartan (COZAAR) 50 MG tablet; Take 1 tablet (50 mg total) by mouth daily.  Dispense: 90 tablet; Refill: 1 - hydrochlorothiazide (HYDRODIURIL) 25 MG tablet; Take 1 tablet (25 mg total) by mouth daily.  Dispense: 90 tablet; Refill: 1 - amLODipine (NORVASC) 10 MG tablet; Take 1 tablet (10 mg total) by mouth daily.  Dispense: 90 tablet; Refill: 1  2. Mixed hyperlipidemia Low fat diet - Lipid panel - atorvastatin (LIPITOR) 40 MG tablet; Take 1 tablet (40 mg total) by mouth daily.  Dispense: 90 tablet; Refill: 1  3. Type 2 diabetes mellitus without complication, without long-term current use of insulin (HCC) continue to watch carbs in diet - Bayer DCA Hb A1c Waived - Microalbumin / creatinine urine ratio - metFORMIN (GLUCOPHAGE) 500 MG tablet; Take 1 tablet (500  mg total) by mouth 2 (two) times daily with a meal.  Dispense: 180 tablet; Refill: 1  4. Benign prostatic hyperplasia without lower urinary tract symptoms Report any voiding problems - finasteride (PROSCAR) 5 MG tablet; Take 1 tablet (5 mg total) by mouth daily.  Dispense: 90 tablet; Refill: 1  5. BMI 32.0-32.9,adult Discussed diet and exercise for person with BMI >25 Will recheck weight in 3-6 months    Labs pending Health Maintenance reviewed Diet and exercise encouraged  Follow up plan: 3 months   [name] Done, FNP

## 2020-01-22 NOTE — Patient Instructions (Signed)
Bullous Pemphigoid  Bullous pemphigoid is a skin disease that causes blisters to form. It ranges in severity and can last for a long time. The disease can come back months or years after it goes away. Bullous pemphigoid is an autoimmune disease. This means that the body's own disease-fighting system (immune system) attacks the body. What are the causes? The cause of this condition is not known. Certain medicines and conditions, such as psoriasis, lichen planus, and multiple sclerosis, have been associated with bullous pemphigoid. What increases the risk? This condition is more likely to develop in people over the age of 46. What are the signs or symptoms? This condition causes blisters to form on the skin. In mild cases, only a few small blisters form. In severe cases, many large blisters form in several areas of the body. The most common places blisters form are the groin, armpits, torso, thighs, and forearms. Some people develop blisters in the mouth. The blisters may break open, forming ulcers. Other symptoms of this condition include:  Redness.  Irritation.  Itchiness.  Bleeding gums.  Difficulty eating.  Cough.  Pain with swallowing.  Nosebleeds. How is this diagnosed? This condition may be diagnosed with a physical exam and blood tests. You may have a skin sample removed for testing (skin biopsy) to confirm diagnosis. You may work with a health care provider who specializes in skin care (dermatologist). How is this treated? This condition may be managed with medicines, such as:  Antibiotic medicines to prevent infection.  Steroid medicines to reduce inflammation. These may be applied to the skin (topical), taken by mouth (oral), or given as injections.  Medicines that reduce the activity of (suppress) the immune system. If your mouth or lips are affected, your health care provider may recommend changing your diet while you are having symptoms. If your symptoms are severe,  you may need to be treated at the hospital. Treatment at the hospital may include:  Treatment for ulcers.  IV medicines.  IV nutrition, if your mouth or lips are affected. Follow these instructions at home:  Take over-the-counter and prescription medicines only as told by your health care provider.  Keep your skin clean.  Do not scratch, pop, or drain your blisters. Doing so can lead to infection.  Cover blisters or ulcers with clean bandages until they heal. Change the bandages once a day or as often as recommended by your health care provider.  If you have blisters or ulcers in your mouth or lips: ? Try only drinking liquids or only eating soft foods to help relieve discomfort while eating. ? Avoid drinking very hot liquids.  Keep all follow-up visits as told by your health care provider. This is important. Contact a health care provider if you:  Have pain or itchiness that does not get better with medicine.  Develop redness, swelling, or pain that spreads away from your blisters or ulcers.  Have pus coming from a blister or ulcer. Get help right away if you:  Have a fever.  Become confused.  Have severe pain.  Feel unusually tired or weak.  Cannot eat or drink because of blisters, ulcers, or pain in your lips or mouth.  Cannot care for yourself because of blisters, ulcers, or pain in your hands or in the soles of your feet. Summary  Bullous pemphigoid is a skin disease that causes blisters to form.  This an autoimmune disease, which means that the body's own disease-fighting system (immune system) attacks the body.  This  condition can be treated with medicines. In some cases, you may need treatment at a hospital.  Do not scratch, pop, or drain your blisters. This information is not intended to replace advice given to you by your health care provider. Make sure you discuss any questions you have with your health care provider. Document Revised: 07/22/2017  Document Reviewed: 06/15/2017 Elsevier Patient Education  2020 [employer].

## 2020-01-23 LAB — CMP14+EGFR
ALT: 8 IU/L (ref 0–44)
AST: 12 IU/L (ref 0–40)
Albumin/Globulin Ratio: 1.7 (ref 1.2–2.2)
Albumin: 4.2 g/dL (ref 3.8–4.8)
Alkaline Phosphatase: 89 IU/L (ref 48–121)
BUN/Creatinine Ratio: 13 (ref 10–24)
BUN: 16 mg/dL (ref 8–27)
Bilirubin Total: 0.9 mg/dL (ref 0.0–1.2)
CO2: 21 mmol/L (ref 20–29)
Calcium: 10.1 mg/dL (ref 8.6–10.2)
Chloride: 99 mmol/L (ref 96–106)
Creatinine, Ser: 1.2 mg/dL (ref 0.76–1.27)
GFR calc Af Amer: 70 mL/min/{1.73_m2} (ref >59)
GFR calc non Af Amer: 61 mL/min/{1.73_m2} (ref >59)
Globulin, Total: 2.5 g/dL (ref 1.5–4.5)
Glucose: 133 mg/dL — ABNORMAL HIGH (ref 65–99)
Potassium: 4.3 mmol/L (ref 3.5–5.2)
Sodium: 138 mmol/L (ref 134–144)
Total Protein: 6.7 g/dL (ref 6.0–8.5)

## 2020-01-23 LAB — CBC WITH DIFFERENTIAL/PLATELET
Basophils Absolute: 0 10*3/uL (ref 0.0–0.2)
Basos: 1 %
EOS (ABSOLUTE): 0.3 10*3/uL (ref 0.0–0.4)
Eos: 6 %
Hematocrit: 41.1 % (ref 37.5–51.0)
Hemoglobin: 13.3 g/dL (ref 13.0–17.7)
Immature Grans (Abs): 0 10*3/uL (ref 0.0–0.1)
Immature Granulocytes: 0 %
Lymphocytes Absolute: 1.4 10*3/uL (ref 0.7–3.1)
Lymphs: 29 %
MCH: 28.7 pg (ref 26.6–33.0)
MCHC: 32.4 g/dL (ref 31.5–35.7)
MCV: 89 fL (ref 79–97)
Monocytes Absolute: 0.4 10*3/uL (ref 0.1–0.9)
Monocytes: 9 %
Neutrophils Absolute: 2.6 10*3/uL (ref 1.4–7.0)
Neutrophils: 55 %
Platelets: 273 10*3/uL (ref 150–450)
RBC: 4.64 x10E6/uL (ref 4.14–5.80)
RDW: 13.3 % (ref 11.6–15.4)
WBC: 4.8 10*3/uL (ref 3.4–10.8)

## 2020-01-23 LAB — LIPID PANEL
Chol/HDL Ratio: 2.8 ratio (ref 0.0–5.0)
Cholesterol, Total: 125 mg/dL (ref 100–199)
HDL: 45 mg/dL (ref >39)
LDL Chol Calc (NIH): 67 mg/dL (ref 0–99)
Triglycerides: 60 mg/dL (ref 0–149)
VLDL Cholesterol Cal: 13 mg/dL (ref 5–40)

## 2020-01-23 LAB — MICROALBUMIN / CREATININE URINE RATIO
Creatinine, Urine: 116.7 mg/dL
Microalb/Creat Ratio: 3 mg/g creat (ref 0–29)
Microalbumin, Urine: 3.4 ug/mL

## 2020-04-23 DIAGNOSIS — S70322A Blister (nonthermal), left thigh, initial encounter: Secondary | ICD-10-CM | POA: Diagnosis not present

## 2020-04-23 DIAGNOSIS — R229 Localized swelling, mass and lump, unspecified: Secondary | ICD-10-CM | POA: Diagnosis not present

## 2020-04-23 DIAGNOSIS — I10 Essential (primary) hypertension: Secondary | ICD-10-CM | POA: Diagnosis not present

## 2020-04-23 DIAGNOSIS — L309 Dermatitis, unspecified: Secondary | ICD-10-CM | POA: Diagnosis not present

## 2020-04-23 DIAGNOSIS — Z79899 Other long term (current) drug therapy: Secondary | ICD-10-CM | POA: Diagnosis not present

## 2020-04-23 DIAGNOSIS — L986 Other infiltrative disorders of the skin and subcutaneous tissue: Secondary | ICD-10-CM | POA: Diagnosis not present

## 2020-04-23 DIAGNOSIS — X58XXXD Exposure to other specified factors, subsequent encounter: Secondary | ICD-10-CM | POA: Diagnosis not present

## 2020-04-23 DIAGNOSIS — T148XXA Other injury of unspecified body region, initial encounter: Secondary | ICD-10-CM | POA: Diagnosis not present

## 2020-04-23 DIAGNOSIS — E119 Type 2 diabetes mellitus without complications: Secondary | ICD-10-CM | POA: Diagnosis not present

## 2020-04-23 DIAGNOSIS — S70322D Blister (nonthermal), left thigh, subsequent encounter: Secondary | ICD-10-CM | POA: Diagnosis not present

## 2020-04-24 ENCOUNTER — Other Ambulatory Visit: Payer: Self-pay

## 2020-04-24 ENCOUNTER — Ambulatory Visit (INDEPENDENT_AMBULATORY_CARE_PROVIDER_SITE_OTHER): Payer: Medicare HMO | Admitting: Nurse Practitioner

## 2020-04-24 ENCOUNTER — Encounter: Payer: Self-pay | Admitting: Nurse Practitioner

## 2020-04-24 VITALS — BP 123/81 | HR 89 | Temp 98.0°F | Resp 20 | Ht 73.0 in | Wt 207.0 lb

## 2020-04-24 DIAGNOSIS — R21 Rash and other nonspecific skin eruption: Secondary | ICD-10-CM | POA: Diagnosis not present

## 2020-04-24 DIAGNOSIS — T148XXA Other injury of unspecified body region, initial encounter: Secondary | ICD-10-CM | POA: Insufficient documentation

## 2020-04-24 DIAGNOSIS — N400 Enlarged prostate without lower urinary tract symptoms: Secondary | ICD-10-CM

## 2020-04-24 DIAGNOSIS — Z6832 Body mass index (BMI) 32.0-32.9, adult: Secondary | ICD-10-CM | POA: Diagnosis not present

## 2020-04-24 DIAGNOSIS — E782 Mixed hyperlipidemia: Secondary | ICD-10-CM

## 2020-04-24 DIAGNOSIS — E119 Type 2 diabetes mellitus without complications: Secondary | ICD-10-CM

## 2020-04-24 DIAGNOSIS — I10 Essential (primary) hypertension: Secondary | ICD-10-CM

## 2020-04-24 LAB — LIPID PANEL
Chol/HDL Ratio: 2.9 ratio (ref 0.0–5.0)
Cholesterol, Total: 119 mg/dL (ref 100–199)
HDL: 41 mg/dL (ref >39)
LDL Chol Calc (NIH): 64 mg/dL (ref 0–99)
Triglycerides: 65 mg/dL (ref 0–149)
VLDL Cholesterol Cal: 14 mg/dL (ref 5–40)

## 2020-04-24 NOTE — Progress Notes (Signed)
Subjective:    Patient ID: Jonathon Adkins, male    DOB: 08-07-1951, 69 y.o.   MRN: [id number]   Chief Complaint: No chief complaint on file.    HPI:  1. Essential hypertension  Does not check BP at home but does try to watch sodium intake. Denies side effects from medications. Denies chest pain, SOB, headaches, dizziness. BP Readings from Last 3 Encounters:  01/22/20 (!) 146/81  10/15/19 133/78  07/10/19 (!) 149/81     2. Type 2 diabetes mellitus without complication, without long-term current use of insulin (HCC) A1C yesterday was 6.9. Does not check blood sugars at home. Denies polyuria, polydipsia, polyphagia, blurry vision, numbness/tingling in feet. Has eye exam scheduled for October.  Lab Results  Component Value Date   HGBA1C 6.8 01/22/2020     3. Benign prostatic hyperplasia without lower urinary tract symptoms No problems with prostate at this time. Has appointment with urology in January. Proscar is working.  4. Mixed hyperlipidemia Does watch fat intake and takes lipitor. No problems at this time. Lab Results  Component Value Date   CHOL 125 01/22/2020   HDL 45 01/22/2020   LDLCALC 67 01/22/2020   TRIG 60 01/22/2020   CHOLHDL 2.8 01/22/2020     5. BMI 32.0-32.9,adult Has lost 7 pounds since last visit.  Wt Readings from Last 3 Encounters:  01/22/20 214 lb (97.1 kg)  10/15/19 222 lb (100.7 kg)  07/10/19 217 lb (98.4 kg)   BMI Readings from Last 3 Encounters:  01/22/20 28.23 kg/m  10/15/19 29.29 kg/m  07/10/19 28.63 kg/m   6. 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 She ordered labwork and did a biopsy on blisters of left thigh. He will follow up with her in November for results and treatment plan. Is currently using kenalog cream and hydrocortisone cream that is helping with pain and itching.    Outpatient Encounter Medications as of 04/24/2020  Medication Sig  . amLODipine (NORVASC) 10 MG tablet Take 1 tablet  (10 mg total) by mouth daily.  Marland Kitchen atorvastatin (LIPITOR) 40 MG tablet Take 1 tablet (40 mg total) by mouth daily.  . cholecalciferol (VITAMIN D) 1000 UNITS tablet Take 1 tablet (1,000 Units total) by mouth daily.  . finasteride (PROSCAR) 5 MG tablet Take 1 tablet (5 mg total) by mouth daily.  . hydrochlorothiazide (HYDRODIURIL) 25 MG tablet Take 1 tablet (25 mg total) by mouth daily.  . hydrocortisone cream 1 % Apply 1 application topically 2 (two) times daily.  . hydrOXYzine (ATARAX/VISTARIL) 25 MG tablet TAKE 1 TABLET BY MOUTH THREE TIMES DAILY AS NEEDED FOR ITCHING  . losartan (COZAAR) 50 MG tablet Take 1 tablet (50 mg total) by mouth daily.  . metFORMIN (GLUCOPHAGE) 500 MG tablet Take 1 tablet (500 mg total) by mouth 2 (two) times daily with a meal.  . triamcinolone cream (KENALOG) 0.1 % Apply 1 application topically 2 (two) times daily.   No facility-administered encounter medications on file as of 04/24/2020.    Past Surgical History:  Procedure Laterality Date  . COLONOSCOPY N/A 02/06/2014   Procedure: COLONOSCOPY;  Surgeon: Rogene Houston, MD;  Location: [facility];  Service: Endoscopy;  Laterality: N/A;  100    Family History  Problem Relation Age of Onset  . Diabetes Mother   . Coronary artery disease Father     New complaints: None  Social history: Lives with wife  Controlled substance contract: N/A     Review  of Systems  Constitutional: Negative.   HENT: Negative.   Respiratory: Negative.   Cardiovascular: Negative.   Gastrointestinal: Negative.   Genitourinary: Negative.   Musculoskeletal: Negative.   Skin: Positive for rash.       Blisters on arms and legs and one spot on groin/lipoma on back of neck  Neurological: Negative.   Psychiatric/Behavioral: Negative.        Objective:   Physical Exam Vitals and nursing note reviewed.  Constitutional:      Appearance: Normal appearance.  HENT:     Head: Normocephalic.     Right Ear: Tympanic membrane  normal.     Left Ear: Tympanic membrane normal.     Nose: Nose normal.     Mouth/Throat:     Mouth: Mucous membranes are moist.     Pharynx: Oropharynx is clear.  Eyes:     Conjunctiva/sclera: Conjunctivae normal.     Pupils: Pupils are equal, round, and reactive to light.  Cardiovascular:     Rate and Rhythm: Normal rate and regular rhythm.     Pulses: Normal pulses.     Heart sounds: Normal heart sounds.  Pulmonary:     Effort: Pulmonary effort is normal.     Breath sounds: Normal breath sounds.  Abdominal:     General: Abdomen is flat. Bowel sounds are normal.     Palpations: Abdomen is soft.  Musculoskeletal:        General: Normal range of motion.     Cervical back: Normal range of motion.  Skin:    General: Skin is warm and dry.     Capillary Refill: Capillary refill takes less than 2 seconds.     Findings: Abscess (baseball-sized lipoma on back of neck, soft/non-tender) and rash present. Rash is vesicular (multiple vesicles on bilateral arms/legs).  Neurological:     General: No focal deficit present.     Mental Status: He is alert and oriented to person, place, and time.  Psychiatric:        Mood and Affect: Mood normal.   BP 123/81   Pulse 89   Temp 98 F (36.7 C) (Temporal)   Resp 20   Ht 6\' 1"  (1.854 m)   Wt 207 lb (93.9 kg)   SpO2 100%   BMI 27.31 kg/m       Assessment & Plan:  SAIVON PROWSE comes in today with chief complaint of Medical Management of Chronic Issues   Diagnosis and orders addressed:  1. Essential hypertension Low sodium diet  2. Type 2 diabetes mellitus without complication, without long-term current use of insulin (HCC) Watch carbohydrates in diet  3. Benign prostatic hyperplasia without lower urinary tract symptoms Follow up with urology as scheduled  4. Mixed hyperlipidemia Low fat/low cholesterol diet - Lipid panel  5. BMI 32.0-32.9,adult Discussed diet and exercise for person with BMI >25 Will recheck weight in 3-6  months  6. Blistering of skin Follow up with dermatologist as scheduled  Current Outpatient Medications on File Prior to Visit  Medication Sig Dispense Refill  . amLODipine (NORVASC) 10 MG tablet Take 1 tablet (10 mg total) by mouth daily. 90 tablet 1  . atorvastatin (LIPITOR) 40 MG tablet Take 1 tablet (40 mg total) by mouth daily. 90 tablet 1  . cholecalciferol (VITAMIN D) 1000 UNITS tablet Take 1 tablet (1,000 Units total) by mouth daily. 90 tablet 1  . finasteride (PROSCAR) 5 MG tablet Take 1 tablet (5 mg total) by mouth daily. 90 tablet  1  . hydrochlorothiazide (HYDRODIURIL) 25 MG tablet Take 1 tablet (25 mg total) by mouth daily. 90 tablet 1  . hydrocortisone cream 1 % Apply 1 application topically 2 (two) times daily.    Marland Kitchen losartan (COZAAR) 50 MG tablet Take 1 tablet (50 mg total) by mouth daily. 90 tablet 1  . metFORMIN (GLUCOPHAGE) 500 MG tablet Take 1 tablet (500 mg total) by mouth 2 (two) times daily with a meal. 180 tablet 1  . triamcinolone cream (KENALOG) 0.1 % Apply 1 application topically 2 (two) times daily. 453 g 0   No current facility-administered medications on file prior to visit.       Labs pending Health Maintenance reviewed Diet and exercise encouraged  Follow up plan: 3 months   [name] Done, FNP

## 2020-04-24 NOTE — Patient Instructions (Signed)

## 2020-06-16 DIAGNOSIS — H35033 Hypertensive retinopathy, bilateral: Secondary | ICD-10-CM | POA: Diagnosis not present

## 2020-06-16 DIAGNOSIS — H11003 Unspecified pterygium of eye, bilateral: Secondary | ICD-10-CM | POA: Diagnosis not present

## 2020-06-16 DIAGNOSIS — H5203 Hypermetropia, bilateral: Secondary | ICD-10-CM | POA: Diagnosis not present

## 2020-06-16 DIAGNOSIS — H40019 Open angle with borderline findings, low risk, unspecified eye: Secondary | ICD-10-CM | POA: Diagnosis not present

## 2020-06-16 DIAGNOSIS — H524 Presbyopia: Secondary | ICD-10-CM | POA: Diagnosis not present

## 2020-06-16 DIAGNOSIS — H02403 Unspecified ptosis of bilateral eyelids: Secondary | ICD-10-CM | POA: Diagnosis not present

## 2020-06-16 DIAGNOSIS — Z7984 Long term (current) use of oral hypoglycemic drugs: Secondary | ICD-10-CM | POA: Diagnosis not present

## 2020-06-16 DIAGNOSIS — H2513 Age-related nuclear cataract, bilateral: Secondary | ICD-10-CM | POA: Diagnosis not present

## 2020-06-16 DIAGNOSIS — E119 Type 2 diabetes mellitus without complications: Secondary | ICD-10-CM | POA: Diagnosis not present

## 2020-06-16 DIAGNOSIS — H52223 Regular astigmatism, bilateral: Secondary | ICD-10-CM | POA: Diagnosis not present

## 2020-06-16 LAB — HM DIABETES EYE EXAM

## 2020-06-27 DIAGNOSIS — L120 Bullous pemphigoid: Secondary | ICD-10-CM | POA: Diagnosis not present

## 2020-07-28 ENCOUNTER — Other Ambulatory Visit: Payer: Self-pay

## 2020-07-28 ENCOUNTER — Encounter: Payer: Self-pay | Admitting: Nurse Practitioner

## 2020-07-28 ENCOUNTER — Ambulatory Visit (INDEPENDENT_AMBULATORY_CARE_PROVIDER_SITE_OTHER): Payer: Medicare HMO | Admitting: Nurse Practitioner

## 2020-07-28 VITALS — BP 133/81 | HR 94 | Temp 98.1°F | Resp 20 | Ht 73.0 in | Wt 207.0 lb

## 2020-07-28 DIAGNOSIS — Z6832 Body mass index (BMI) 32.0-32.9, adult: Secondary | ICD-10-CM

## 2020-07-28 DIAGNOSIS — I10 Essential (primary) hypertension: Secondary | ICD-10-CM

## 2020-07-28 DIAGNOSIS — E782 Mixed hyperlipidemia: Secondary | ICD-10-CM

## 2020-07-28 DIAGNOSIS — E119 Type 2 diabetes mellitus without complications: Secondary | ICD-10-CM | POA: Diagnosis not present

## 2020-07-28 DIAGNOSIS — N400 Enlarged prostate without lower urinary tract symptoms: Secondary | ICD-10-CM | POA: Diagnosis not present

## 2020-07-28 LAB — BAYER DCA HB A1C WAIVED: HB A1C (BAYER DCA - WAIVED): 6.6 % (ref <7.0)

## 2020-07-28 MED ORDER — METFORMIN HCL 500 MG PO TABS: 500.0000 mg | ORAL_TABLET | Freq: Two times a day (BID) | ORAL | 1 refills | Status: DC

## 2020-07-28 MED ORDER — AMLODIPINE BESYLATE 10 MG PO TABS: 10.0000 mg | ORAL_TABLET | Freq: Every day | ORAL | 1 refills | Status: DC

## 2020-07-28 MED ORDER — ATORVASTATIN CALCIUM 40 MG PO TABS: 40.0000 mg | ORAL_TABLET | Freq: Every day | ORAL | 1 refills | Status: DC

## 2020-07-28 MED ORDER — LOSARTAN POTASSIUM 50 MG PO TABS: 50.0000 mg | ORAL_TABLET | Freq: Every day | ORAL | 1 refills | Status: DC

## 2020-07-28 MED ORDER — FINASTERIDE 5 MG PO TABS: 5.0000 mg | ORAL_TABLET | Freq: Every day | ORAL | 1 refills | Status: DC

## 2020-07-28 MED ORDER — HYDROCHLOROTHIAZIDE 25 MG PO TABS: 25.0000 mg | ORAL_TABLET | Freq: Every day | ORAL | 1 refills | Status: DC

## 2020-07-28 NOTE — Progress Notes (Signed)
Subjective:    Patient ID: Jonathon Adkins, male    DOB: 16-Jun-1951, 69 y.o.   MRN: [id number]   Chief Complaint: Medical Management of Chronic Issues    HPI:  1. Primary hypertension No c/o chest pain, sob or headache. Does not check blood pressure at home. BP Readings from Last 3 Encounters:  04/24/20 123/81  01/22/20 (!) 146/81  10/15/19 133/78     2. Mixed hyperlipidemia Does watch diet and stays very active. Lab Results  Component Value Date   CHOL 119 04/24/2020   HDL 41 04/24/2020   LDLCALC 64 04/24/2020   TRIG 65 04/24/2020   CHOLHDL 2.9 04/24/2020     3. Type 2 diabetes mellitus without complication, without long-term current use of insulin (Jonathon Adkins) He does not checking his blood sugars at home. He did hav e dizzy spell last week, but did not check his blood sugar. Lab Results  Component Value Date   HGBA1C 6.8 01/22/2020     4. Benign prostatic hyperplasia without lower urinary tract symptoms Is currently taking proscar and denies any problems voiding  5. BMI 32.0-32.9,adult No recent weight changes Wt Readings from Last 3 Encounters:  07/28/20 207 lb (93.9 kg)  04/24/20 207 lb (93.9 kg)  01/22/20 214 lb (97.1 kg)   BMI Readings from Last 3 Encounters:  07/28/20 27.31 kg/m  04/24/20 27.31 kg/m  01/22/20 28.23 kg/m       Outpatient Encounter Medications as of 07/28/2020  Medication Sig  . amLODipine (NORVASC) 10 MG tablet Take 1 tablet (10 mg total) by mouth daily.  Marland Kitchen atorvastatin (LIPITOR) 40 MG tablet Take 1 tablet (40 mg total) by mouth daily.  . cholecalciferol (VITAMIN D) 1000 UNITS tablet Take 1 tablet (1,000 Units total) by mouth daily.  . clobetasol ointment (TEMOVATE) 4.17 % Apply 1 application topically 2 (two) times daily.  . finasteride (PROSCAR) 5 MG tablet Take 1 tablet (5 mg total) by mouth daily.  . hydrochlorothiazide (HYDRODIURIL) 25 MG tablet Take 1 tablet (25 mg total) by mouth daily.  . hydrocortisone cream 1 % Apply 1  application topically 2 (two) times daily.  Marland Kitchen losartan (COZAAR) 50 MG tablet Take 1 tablet (50 mg total) by mouth daily.  . metFORMIN (GLUCOPHAGE) 500 MG tablet Take 1 tablet (500 mg total) by mouth 2 (two) times daily with a meal.  . triamcinolone cream (KENALOG) 0.1 % Apply 1 application topically 2 (two) times daily.    Past Surgical History:  Procedure Laterality Date  . COLONOSCOPY N/A 02/06/2014   Procedure: COLONOSCOPY;  Surgeon: Rogene Houston, MD;  Location: [facility];  Service: Endoscopy;  Laterality: N/A;  100    Family History  Problem Relation Age of Onset  . Diabetes Mother   . Coronary artery disease Father     New complaints: None today  Social history: Lives with his wife  Controlled substance contract: n/a    Review of Systems  Constitutional: Negative for diaphoresis.  Eyes: Negative for pain.  Respiratory: Negative for shortness of breath.   Cardiovascular: Negative for chest pain, palpitations and leg swelling.  Gastrointestinal: Negative for abdominal pain.  Endocrine: Negative for polydipsia.  Skin: Negative for rash.  Neurological: Negative for dizziness, weakness and headaches.  Hematological: Does not bruise/bleed easily.  All other systems reviewed and are negative.      Objective:   Physical Exam Vitals and nursing note reviewed.  Constitutional:      Appearance: Normal appearance. He is well-developed.  HENT:     Head: Normocephalic.     Nose: Nose normal.  Eyes:     Pupils: Pupils are equal, round, and reactive to light.  Neck:     Thyroid: No thyroid mass or thyromegaly.     Vascular: No carotid bruit or JVD.     Trachea: Phonation normal.  Cardiovascular:     Rate and Rhythm: Normal rate and regular rhythm.  Pulmonary:     Effort: Pulmonary effort is normal. No respiratory distress.     Breath sounds: Normal breath sounds.  Abdominal:     General: Bowel sounds are normal.     Palpations: Abdomen is soft.      Tenderness: There is no abdominal tenderness.  Musculoskeletal:        General: Normal range of motion.     Cervical back: Normal range of motion and neck supple.  Lymphadenopathy:     Cervical: No cervical adenopathy.  Skin:    General: Skin is warm and dry.     Comments: Vesicular lesions on bol forearms and bil lower ext.  Neurological:     Mental Status: He is alert and oriented to person, place, and time.  Psychiatric:        Behavior: Behavior normal.        Thought Content: Thought content normal.        Judgment: Judgment normal.    BP 133/81   Pulse 94   Temp 98.1 F (36.7 C) (Temporal)   Resp 20   Ht _0  (1.854 m)   Wt 207 lb (93.9 kg)   SpO2 100%   BMI 27.31 kg/m         Assessment & Plan:  Jonathon Adkins comes in today with chief complaint of Medical Management of Chronic Issues   Diagnosis and orders addressed:  1. Primary hypertension Low sodium diet - CBC with Differential/Platelet - CMP14+EGFR - losartan (COZAAR) 50 MG tablet; Take 1 tablet (50 mg total) by mouth daily.  Dispense: 90 tablet; Refill: 1 - hydrochlorothiazide (HYDRODIURIL) 25 MG tablet; Take 1 tablet (25 mg total) by mouth daily.  Dispense: 90 tablet; Refill: 1 - amLODipine (NORVASC) 10 MG tablet; Take 1 tablet (10 mg total) by mouth daily.  Dispense: 90 tablet; Refill: 1  2. Mixed hyperlipidemia Low fat diet - Lipid panel - atorvastatin (LIPITOR) 40 MG tablet; Take 1 tablet (40 mg total) by mouth daily.  Dispense: 90 tablet; Refill: 1  3. Type 2 diabetes mellitus without complication, without long-term current use of insulin (Jonathon Adkins) Will  Call with hgba1c results - Bayer DCA Hb A1c Waived - metFORMIN (GLUCOPHAGE) 500 MG tablet; Take 1 tablet (500 mg total) by mouth 2 (two) times daily with a meal.  Dispense: 180 tablet; Refill: 1  4. Benign prostatic hyperplasia without lower urinary tract symptoms Keep follow up with urology - finasteride (PROSCAR) 5 MG tablet; Take 1 tablet (5  mg total) by mouth daily.  Dispense: 90 tablet; Refill: 1  5. BMI 32.0-32.9,adult Discussed diet and exercise for person with BMI >25 Will recheck weight in 3-6 months     Labs pending Health Maintenance reviewed Diet and exercise encouraged  Follow up plan: 3 months   [name] Done, FNP

## 2020-07-28 NOTE — Patient Instructions (Signed)
Diabetes Mellitus and Exercise Exercising regularly is important for your overall health, especially when you have diabetes (diabetes mellitus). Exercising is not only about losing weight. It has many other health benefits, such as increasing muscle strength and bone density and reducing body fat and stress. This leads to improved fitness, flexibility, and endurance, all of which result in better overall health. Exercise has additional benefits for people with diabetes, including:  Reducing appetite.  Helping to lower and control blood glucose.  Lowering blood pressure.  Helping to control amounts of fatty substances (lipids) in the blood, such as cholesterol and triglycerides.  Helping the body to respond better to insulin (improving insulin sensitivity).  Reducing how much insulin the body needs.  Decreasing the risk for heart disease by: ? Lowering cholesterol and triglyceride levels. ? Increasing the levels of good cholesterol. ? Lowering blood glucose levels. What is my activity plan? Your health care provider or certified diabetes educator can help you make a plan for the type and frequency of exercise (activity plan) that works for you. Make sure that you:  Do at least 150 minutes of moderate-intensity or vigorous-intensity exercise each week. This could be brisk walking, biking, or water aerobics. ? Do stretching and strength exercises, such as yoga or weightlifting, at least 2 times a week. ? Spread out your activity over at least 3 days of the week.  Get some form of physical activity every day. ? Do not go more than 2 days in a row without some kind of physical activity. ? Avoid being inactive for more than 30 minutes at a time. Take frequent breaks to walk or stretch.  Choose a type of exercise or activity that you enjoy, and set realistic goals.  Start slowly, and gradually increase the intensity of your exercise over time. What do I need to know about managing my  diabetes?   Check your blood glucose before and after exercising. ? If your blood glucose is 240 mg/dL (13.3 mmol/L) or higher before you exercise, check your urine for ketones. If you have ketones in your urine, do not exercise until your blood glucose returns to normal. ? If your blood glucose is 100 mg/dL (5.6 mmol/L) or lower, eat a snack containing 15-20 grams of carbohydrate. Check your blood glucose 15 minutes after the snack to make sure that your level is above 100 mg/dL (5.6 mmol/L) before you start your exercise.  Know the symptoms of low blood glucose (hypoglycemia) and how to treat it. Your risk for hypoglycemia increases during and after exercise. Common symptoms of hypoglycemia can include: ? Hunger. ? Anxiety. ? Sweating and feeling clammy. ? Confusion. ? Dizziness or feeling light-headed. ? Increased heart rate or palpitations. ? Blurry vision. ? Tingling or numbness around the mouth, lips, or tongue. ? Tremors or shakes. ? Irritability.  Keep a rapid-acting carbohydrate snack available before, during, and after exercise to help prevent or treat hypoglycemia.  Avoid injecting insulin into areas of the body that are going to be exercised. For example, avoid injecting insulin into: ? The arms, when playing tennis. ? The legs, when jogging.  Keep records of your exercise habits. Doing this can help you and your health care provider adjust your diabetes management plan as needed. Write down: ? Food that you eat before and after you exercise. ? Blood glucose levels before and after you exercise. ? The type and amount of exercise you have done. ? When your insulin is expected to peak, if you use   insulin. Avoid exercising at times when your insulin is peaking.  When you start a new exercise or activity, work with your health care provider to make sure the activity is safe for you, and to adjust your insulin, medicines, or food intake as needed.  Drink plenty of water while  you exercise to prevent dehydration or heat stroke. Drink enough fluid to keep your urine clear or pale yellow. Summary  Exercising regularly is important for your overall health, especially when you have diabetes (diabetes mellitus).  Exercising has many health benefits, such as increasing muscle strength and bone density and reducing body fat and stress.  Your health care provider or certified diabetes educator can help you make a plan for the type and frequency of exercise (activity plan) that works for you.  When you start a new exercise or activity, work with your health care provider to make sure the activity is safe for you, and to adjust your insulin, medicines, or food intake as needed. This information is not intended to replace advice given to you by your health care provider. Make sure you discuss any questions you have with your health care provider. Document Revised: 03/03/2017 Document Reviewed: 01/19/2016 Elsevier Patient Education  2020 Elsevier Inc.  

## 2020-07-29 LAB — LIPID PANEL
Chol/HDL Ratio: 2.8 ratio (ref 0.0–5.0)
Cholesterol, Total: 135 mg/dL (ref 100–199)
HDL: 48 mg/dL (ref >39)
LDL Chol Calc (NIH): 73 mg/dL (ref 0–99)
Triglycerides: 68 mg/dL (ref 0–149)
VLDL Cholesterol Cal: 14 mg/dL (ref 5–40)

## 2020-07-29 LAB — CBC WITH DIFFERENTIAL/PLATELET
Basophils Absolute: 0 10*3/uL (ref 0.0–0.2)
Basos: 1 %
EOS (ABSOLUTE): 0.3 10*3/uL (ref 0.0–0.4)
Eos: 5 %
Hematocrit: 41.7 % (ref 37.5–51.0)
Hemoglobin: 13.7 g/dL (ref 13.0–17.7)
Immature Grans (Abs): 0 10*3/uL (ref 0.0–0.1)
Immature Granulocytes: 0 %
Lymphocytes Absolute: 1.4 10*3/uL (ref 0.7–3.1)
Lymphs: 27 %
MCH: 29 pg (ref 26.6–33.0)
MCHC: 32.9 g/dL (ref 31.5–35.7)
MCV: 88 fL (ref 79–97)
Monocytes Absolute: 0.4 10*3/uL (ref 0.1–0.9)
Monocytes: 8 %
Neutrophils Absolute: 2.9 10*3/uL (ref 1.4–7.0)
Neutrophils: 59 %
Platelets: 325 10*3/uL (ref 150–450)
RBC: 4.72 x10E6/uL (ref 4.14–5.80)
RDW: 12.8 % (ref 11.6–15.4)
WBC: 5 10*3/uL (ref 3.4–10.8)

## 2020-07-29 LAB — CMP14+EGFR
ALT: 13 IU/L (ref 0–44)
AST: 18 IU/L (ref 0–40)
Albumin/Globulin Ratio: 1.5 (ref 1.2–2.2)
Albumin: 4 g/dL (ref 3.7–4.7)
Alkaline Phosphatase: 77 IU/L (ref 44–121)
BUN/Creatinine Ratio: 9 — ABNORMAL LOW (ref 10–24)
BUN: 12 mg/dL (ref 8–27)
Bilirubin Total: 0.7 mg/dL (ref 0.0–1.2)
CO2: 24 mmol/L (ref 20–29)
Calcium: 9.7 mg/dL (ref 8.6–10.2)
Chloride: 99 mmol/L (ref 96–106)
Creatinine, Ser: 1.32 mg/dL — ABNORMAL HIGH (ref 0.76–1.27)
GFR calc Af Amer: 62 mL/min/{1.73_m2} (ref >59)
GFR calc non Af Amer: 54 mL/min/{1.73_m2} — ABNORMAL LOW (ref >59)
Globulin, Total: 2.6 g/dL (ref 1.5–4.5)
Glucose: 128 mg/dL — ABNORMAL HIGH (ref 65–99)
Potassium: 4.4 mmol/L (ref 3.5–5.2)
Sodium: 139 mmol/L (ref 134–144)
Total Protein: 6.6 g/dL (ref 6.0–8.5)

## 2020-09-05 DIAGNOSIS — L120 Bullous pemphigoid: Secondary | ICD-10-CM | POA: Diagnosis not present

## 2020-10-31 ENCOUNTER — Ambulatory Visit (INDEPENDENT_AMBULATORY_CARE_PROVIDER_SITE_OTHER): Payer: Medicare HMO | Admitting: Nurse Practitioner

## 2020-10-31 ENCOUNTER — Encounter: Payer: Self-pay | Admitting: Nurse Practitioner

## 2020-10-31 ENCOUNTER — Other Ambulatory Visit: Payer: Self-pay

## 2020-10-31 VITALS — BP 126/73 | HR 92 | Temp 97.8°F | Resp 20 | Ht 73.0 in | Wt 204.0 lb

## 2020-10-31 DIAGNOSIS — Z125 Encounter for screening for malignant neoplasm of prostate: Secondary | ICD-10-CM

## 2020-10-31 DIAGNOSIS — E782 Mixed hyperlipidemia: Secondary | ICD-10-CM | POA: Diagnosis not present

## 2020-10-31 DIAGNOSIS — I10 Essential (primary) hypertension: Secondary | ICD-10-CM

## 2020-10-31 DIAGNOSIS — Z6827 Body mass index (BMI) 27.0-27.9, adult: Secondary | ICD-10-CM | POA: Diagnosis not present

## 2020-10-31 DIAGNOSIS — E119 Type 2 diabetes mellitus without complications: Secondary | ICD-10-CM

## 2020-10-31 DIAGNOSIS — N400 Enlarged prostate without lower urinary tract symptoms: Secondary | ICD-10-CM

## 2020-10-31 LAB — BAYER DCA HB A1C WAIVED: HB A1C (BAYER DCA - WAIVED): 6.7 % (ref <7.0)

## 2020-10-31 MED ORDER — METFORMIN HCL 500 MG PO TABS
500.0000 mg | ORAL_TABLET | Freq: Two times a day (BID) | ORAL | 1 refills | Status: DC
Start: 2020-10-31 — End: 2021-05-04

## 2020-10-31 MED ORDER — ATORVASTATIN CALCIUM 40 MG PO TABS: 40.0000 mg | ORAL_TABLET | Freq: Every day | ORAL | 1 refills | Status: DC

## 2020-10-31 MED ORDER — HYDROCHLOROTHIAZIDE 25 MG PO TABS: 25.0000 mg | ORAL_TABLET | Freq: Every day | ORAL | 1 refills | Status: DC

## 2020-10-31 MED ORDER — FINASTERIDE 5 MG PO TABS: 5.0000 mg | ORAL_TABLET | Freq: Every day | ORAL | 1 refills | Status: DC

## 2020-10-31 MED ORDER — LOSARTAN POTASSIUM 50 MG PO TABS: 50.0000 mg | ORAL_TABLET | Freq: Every day | ORAL | 1 refills | Status: DC

## 2020-10-31 MED ORDER — AMLODIPINE BESYLATE 10 MG PO TABS: 10.0000 mg | ORAL_TABLET | Freq: Every day | ORAL | 1 refills | Status: DC

## 2020-10-31 NOTE — Progress Notes (Addendum)
Subjective:    Patient ID: Jonathon Adkins, male    DOB: 16-Dec-1950, 70 y.o.   MRN: [id number]   Chief Complaint: medical management of chronic issues     HPI:  1. Primary hypertension No c/o chest pain, sob or headache does not check blood pressure at home. BP Readings from Last 3 Encounters:  07/28/20 133/81  04/24/20 123/81  01/22/20 (!) 146/81     2. Mixed hyperlipidemia Does try to watch diet. Does no dedicated exercise but does stay very busy. Lab Results  Component Value Date   CHOL 135 07/28/2020   HDL 48 07/28/2020   LDLCALC 73 07/28/2020   TRIG 68 07/28/2020   CHOLHDL 2.8 07/28/2020      3. Type 2 diabetes mellitus without complication, without long-term current use of insulin (HCC) Doe snot check blood sugar at home. He does try to watch his diet Lab Results  Component Value Date   HGBA1C 6.6 07/28/2020     4. Screening for prostate cancer Denies any problems voiding.  5. Benign prostatic hyperplasia without lower urinary tract symptoms Again denies any problems voiding. He is on proscar daily. Last saw urology several months ago.  6. BMI 32.0-32.9,adult No recent weight changes Wt Readings from Last 3 Encounters:  10/31/20 204 lb (92.5 kg)  07/28/20 207 lb (93.9 kg)  04/24/20 207 lb (93.9 kg)    BMI Readings from Last 3 Encounters:  07/28/20 27.31 kg/m  04/24/20 27.31 kg/m  01/22/20 28.23 kg/m       Outpatient Encounter Medications as of 10/31/2020  Medication Sig  . amLODipine (NORVASC) 10 MG tablet Take 1 tablet (10 mg total) by mouth daily.  Marland Kitchen atorvastatin (LIPITOR) 40 MG tablet Take 1 tablet (40 mg total) by mouth daily.  . cholecalciferol (VITAMIN D) 1000 UNITS tablet Take 1 tablet (1,000 Units total) by mouth daily.  . clobetasol ointment (TEMOVATE) 0.35 % Apply 1 application topically 2 (two) times daily.  . finasteride (PROSCAR) 5 MG tablet Take 1 tablet (5 mg total) by mouth daily.  . hydrochlorothiazide (HYDRODIURIL) 25 MG  tablet Take 1 tablet (25 mg total) by mouth daily.  . hydrocortisone cream 1 % Apply 1 application topically 2 (two) times daily.  Marland Kitchen losartan (COZAAR) 50 MG tablet Take 1 tablet (50 mg total) by mouth daily.  . metFORMIN (GLUCOPHAGE) 500 MG tablet Take 1 tablet (500 mg total) by mouth 2 (two) times daily with a meal.    Past Surgical History:  Procedure Laterality Date  . COLONOSCOPY N/A 02/06/2014   Procedure: COLONOSCOPY;  Surgeon: Rogene Houston, MD;  Location: [facility];  Service: Endoscopy;  Laterality: N/A;  100    Family History  Problem Relation Age of Onset  . Diabetes Mother   . Coronary artery disease Father     New complaints: None today  Social history: Lives with his wife  Controlled substance contract: n/a    Review of Systems  Constitutional: Negative for diaphoresis.  Eyes: Negative for pain.  Respiratory: Negative for shortness of breath.   Cardiovascular: Negative for chest pain, palpitations and leg swelling.  Gastrointestinal: Negative for abdominal pain.  Endocrine: Negative for polydipsia.  Skin: Positive for rash (bil forearms).  Neurological: Negative for dizziness, weakness and headaches.  Hematological: Does not bruise/bleed easily.  All other systems reviewed and are negative.      Objective:   Physical Exam Vitals and nursing note reviewed.  Constitutional:      Appearance: Normal  appearance. He is well-developed.  HENT:     Head: Normocephalic.     Nose: Nose normal.  Eyes:     Pupils: Pupils are equal, round, and reactive to light.  Neck:     Thyroid: No thyroid mass or thyromegaly.     Vascular: No carotid bruit or JVD.     Trachea: Phonation normal.  Cardiovascular:     Rate and Rhythm: Normal rate and regular rhythm.  Pulmonary:     Effort: Pulmonary effort is normal. No respiratory distress.     Breath sounds: Normal breath sounds.  Abdominal:     General: Bowel sounds are normal.     Palpations: Abdomen is soft.      Tenderness: There is no abdominal tenderness.  Musculoskeletal:        General: Normal range of motion.     Cervical back: Normal range of motion and neck supple.  Lymphadenopathy:     Cervical: No cervical adenopathy.  Skin:    General: Skin is warm and dry.     Findings: Rash (erythematous scaley rash bil forearms that are covered by sleeves.) present.  Neurological:     Mental Status: He is alert and oriented to person, place, and time.  Psychiatric:        Behavior: Behavior normal.        Thought Content: Thought content normal.        Judgment: Judgment normal.    BP 126/73   Pulse 92   Temp 97.8 F (36.6 C) (Temporal)   Resp 20   Ht $R'6\' 1"'EN$  (1.854 m)   Wt 204 lb (92.5 kg)   SpO2 99%   BMI 26.91 kg/m   HGBA1C 6.7%        Assessment & Plan:  Jonathon Adkins comes in today with chief complaint of Medical Management of Chronic Issues   Diagnosis and orders addressed:  1. Primary hypertension Low sodium diet - CBC with Differential/Platelet - CMP14+EGFR - losartan (COZAAR) 50 MG tablet; Take 1 tablet (50 mg total) by mouth daily.  Dispense: 90 tablet; Refill: 1 - hydrochlorothiazide (HYDRODIURIL) 25 MG tablet; Take 1 tablet (25 mg total) by mouth daily.  Dispense: 90 tablet; Refill: 1 - amLODipine (NORVASC) 10 MG tablet; Take 1 tablet (10 mg total) by mouth daily.  Dispense: 90 tablet; Refill: 1  2. Mixed hyperlipidemia Low fat diet - Lipid panel - atorvastatin (LIPITOR) 40 MG tablet; Take 1 tablet (40 mg total) by mouth daily.  Dispense: 90 tablet; Refill: 1  3. Type 2 diabetes mellitus without complication, without long-term current use of insulin (HCC) Continue to watch carbs in diet - Bayer DCA Hb A1c Waived - metFORMIN (GLUCOPHAGE) 500 MG tablet; Take 1 tablet (500 mg total) by mouth 2 (two) times daily with a meal.  Dispense: 180 tablet; Refill: 1  4. Screening for prostate cancer - PSA, total and free  5. Benign prostatic hyperplasia without lower  urinary tract symptoms Keep follow up with urology - finasteride (PROSCAR) 5 MG tablet; Take 1 tablet (5 mg total) by mouth daily.  Dispense: 90 tablet; Refill: 1  6. BMI 27.0-27.9,adult Discussed diet and exercise for person with BMI >25 Will recheck weight in 3-6 months   * keep follow up with dermatology for forearm rash.  Labs pending Health Maintenance reviewed Diet and exercise encouraged  Follow up plan: 6 months   [name] Done, FNP

## 2020-10-31 NOTE — Patient Instructions (Signed)

## 2020-11-01 LAB — LIPID PANEL
Chol/HDL Ratio: 3.1 ratio (ref 0.0–5.0)
Cholesterol, Total: 151 mg/dL (ref 100–199)
HDL: 49 mg/dL (ref >39)
LDL Chol Calc (NIH): 86 mg/dL (ref 0–99)
Triglycerides: 83 mg/dL (ref 0–149)
VLDL Cholesterol Cal: 16 mg/dL (ref 5–40)

## 2020-11-01 LAB — CBC WITH DIFFERENTIAL/PLATELET
Basophils Absolute: 0 10*3/uL (ref 0.0–0.2)
Basos: 1 %
EOS (ABSOLUTE): 0.2 10*3/uL (ref 0.0–0.4)
Eos: 4 %
Hematocrit: 42.6 % (ref 37.5–51.0)
Hemoglobin: 13.7 g/dL (ref 13.0–17.7)
Immature Grans (Abs): 0 10*3/uL (ref 0.0–0.1)
Immature Granulocytes: 1 %
Lymphocytes Absolute: 1.6 10*3/uL (ref 0.7–3.1)
Lymphs: 31 %
MCH: 28.6 pg (ref 26.6–33.0)
MCHC: 32.2 g/dL (ref 31.5–35.7)
MCV: 89 fL (ref 79–97)
Monocytes Absolute: 0.4 10*3/uL (ref 0.1–0.9)
Monocytes: 7 %
Neutrophils Absolute: 3 10*3/uL (ref 1.4–7.0)
Neutrophils: 56 %
Platelets: 292 10*3/uL (ref 150–450)
RBC: 4.79 x10E6/uL (ref 4.14–5.80)
RDW: 13.7 % (ref 11.6–15.4)
WBC: 5.3 10*3/uL (ref 3.4–10.8)

## 2020-11-01 LAB — CMP14+EGFR
ALT: 17 IU/L (ref 0–44)
AST: 14 IU/L (ref 0–40)
Albumin/Globulin Ratio: 1.3 (ref 1.2–2.2)
Albumin: 4.1 g/dL (ref 3.7–4.7)
Alkaline Phosphatase: 102 IU/L (ref 44–121)
BUN/Creatinine Ratio: 11 (ref 10–24)
BUN: 16 mg/dL (ref 8–27)
Bilirubin Total: 0.8 mg/dL (ref 0.0–1.2)
CO2: 23 mmol/L (ref 20–29)
Calcium: 10.1 mg/dL (ref 8.6–10.2)
Chloride: 101 mmol/L (ref 96–106)
Creatinine, Ser: 1.42 mg/dL — ABNORMAL HIGH (ref 0.76–1.27)
Globulin, Total: 3.1 g/dL (ref 1.5–4.5)
Glucose: 127 mg/dL — ABNORMAL HIGH (ref 65–99)
Potassium: 4.4 mmol/L (ref 3.5–5.2)
Sodium: 138 mmol/L (ref 134–144)
Total Protein: 7.2 g/dL (ref 6.0–8.5)
eGFR: 53 mL/min/{1.73_m2} — ABNORMAL LOW (ref >59)

## 2020-11-01 LAB — PSA, TOTAL AND FREE
PSA, Free Pct: 24.3 %
PSA, Free: 1.02 ng/mL
Prostate Specific Ag, Serum: 4.2 ng/mL — ABNORMAL HIGH (ref 0.0–4.0)

## 2020-12-12 DIAGNOSIS — N400 Enlarged prostate without lower urinary tract symptoms: Secondary | ICD-10-CM | POA: Diagnosis not present

## 2020-12-12 DIAGNOSIS — R972 Elevated prostate specific antigen [PSA]: Secondary | ICD-10-CM | POA: Diagnosis not present

## 2020-12-15 DIAGNOSIS — H40019 Open angle with borderline findings, low risk, unspecified eye: Secondary | ICD-10-CM | POA: Diagnosis not present

## 2020-12-22 DIAGNOSIS — L120 Bullous pemphigoid: Secondary | ICD-10-CM | POA: Diagnosis not present

## 2020-12-22 DIAGNOSIS — L299 Pruritus, unspecified: Secondary | ICD-10-CM | POA: Diagnosis not present

## 2020-12-22 DIAGNOSIS — L810 Postinflammatory hyperpigmentation: Secondary | ICD-10-CM | POA: Diagnosis not present

## 2021-04-08 ENCOUNTER — Other Ambulatory Visit: Payer: Self-pay

## 2021-04-08 ENCOUNTER — Emergency Department (HOSPITAL_COMMUNITY)
Admission: EM | Admit: 2021-04-08 | Discharge: 2021-04-08 | Disposition: A | Discharge status: Home / Self Care | Payer: Medicare HMO | Attending: Emergency Medicine | Admitting: Emergency Medicine

## 2021-04-08 ENCOUNTER — Encounter (HOSPITAL_COMMUNITY): Payer: Self-pay | Admitting: Emergency Medicine

## 2021-04-08 ENCOUNTER — Emergency Department (HOSPITAL_COMMUNITY): Payer: Medicare HMO

## 2021-04-08 DIAGNOSIS — Z79899 Other long term (current) drug therapy: Secondary | ICD-10-CM | POA: Insufficient documentation

## 2021-04-08 DIAGNOSIS — S065X0A Traumatic subdural hemorrhage without loss of consciousness, initial encounter: Secondary | ICD-10-CM | POA: Diagnosis not present

## 2021-04-08 DIAGNOSIS — E119 Type 2 diabetes mellitus without complications: Secondary | ICD-10-CM | POA: Insufficient documentation

## 2021-04-08 DIAGNOSIS — Y92007 Garden or yard of unspecified non-institutional (private) residence as the place of occurrence of the external cause: Secondary | ICD-10-CM | POA: Diagnosis not present

## 2021-04-08 DIAGNOSIS — I10 Essential (primary) hypertension: Secondary | ICD-10-CM | POA: Insufficient documentation

## 2021-04-08 DIAGNOSIS — S065X9A Traumatic subdural hemorrhage with loss of consciousness of unspecified duration, initial encounter: Secondary | ICD-10-CM

## 2021-04-08 DIAGNOSIS — R000 Tachycardia, unspecified: Secondary | ICD-10-CM | POA: Diagnosis not present

## 2021-04-08 DIAGNOSIS — Y93H2 Activity, gardening and landscaping: Secondary | ICD-10-CM | POA: Diagnosis not present

## 2021-04-08 DIAGNOSIS — S0081XA Abrasion of other part of head, initial encounter: Secondary | ICD-10-CM | POA: Insufficient documentation

## 2021-04-08 DIAGNOSIS — Z7984 Long term (current) use of oral hypoglycemic drugs: Secondary | ICD-10-CM | POA: Insufficient documentation

## 2021-04-08 DIAGNOSIS — W01198A Fall on same level from slipping, tripping and stumbling with subsequent striking against other object, initial encounter: Secondary | ICD-10-CM | POA: Insufficient documentation

## 2021-04-08 DIAGNOSIS — L0291 Cutaneous abscess, unspecified: Secondary | ICD-10-CM

## 2021-04-08 DIAGNOSIS — L0211 Cutaneous abscess of neck: Secondary | ICD-10-CM | POA: Insufficient documentation

## 2021-04-08 DIAGNOSIS — S065XAA Traumatic subdural hemorrhage with loss of consciousness status unknown, initial encounter: Secondary | ICD-10-CM

## 2021-04-08 DIAGNOSIS — R55 Syncope and collapse: Secondary | ICD-10-CM | POA: Diagnosis not present

## 2021-04-08 DIAGNOSIS — S0990XA Unspecified injury of head, initial encounter: Secondary | ICD-10-CM | POA: Diagnosis not present

## 2021-04-08 DIAGNOSIS — R42 Dizziness and giddiness: Secondary | ICD-10-CM | POA: Diagnosis not present

## 2021-04-08 LAB — CBC WITH DIFFERENTIAL/PLATELET
Abs Immature Granulocytes: 0.04 10*3/uL (ref 0.00–0.07)
Basophils Absolute: 0 10*3/uL (ref 0.0–0.1)
Basophils Relative: 0 %
Eosinophils Absolute: 0 10*3/uL (ref 0.0–0.5)
Eosinophils Relative: 0 %
HCT: 34 % — ABNORMAL LOW (ref 39.0–52.0)
Hemoglobin: 11.2 g/dL — ABNORMAL LOW (ref 13.0–17.0)
Immature Granulocytes: 0 %
Lymphocytes Relative: 8 %
Lymphs Abs: 0.7 10*3/uL (ref 0.7–4.0)
MCH: 29.9 pg (ref 26.0–34.0)
MCHC: 32.9 g/dL (ref 30.0–36.0)
MCV: 90.9 fL (ref 80.0–100.0)
Monocytes Absolute: 0.6 10*3/uL (ref 0.1–1.0)
Monocytes Relative: 6 %
Neutro Abs: 8.1 10*3/uL — ABNORMAL HIGH (ref 1.7–7.7)
Neutrophils Relative %: 86 %
Platelets: 321 10*3/uL (ref 150–400)
RBC: 3.74 MIL/uL — ABNORMAL LOW (ref 4.22–5.81)
RDW: 12.6 % (ref 11.5–15.5)
WBC: 9.5 10*3/uL (ref 4.0–10.5)
nRBC: 0 % (ref 0.0–0.2)

## 2021-04-08 LAB — BASIC METABOLIC PANEL
Anion gap: 7 (ref 5–15)
BUN: 15 mg/dL (ref 8–23)
CO2: 26 mmol/L (ref 22–32)
Calcium: 8.8 mg/dL — ABNORMAL LOW (ref 8.9–10.3)
Chloride: 98 mmol/L (ref 98–111)
Creatinine, Ser: 1.36 mg/dL — ABNORMAL HIGH (ref 0.61–1.24)
GFR, Estimated: 55 mL/min — ABNORMAL LOW (ref >60)
Glucose, Bld: 148 mg/dL — ABNORMAL HIGH (ref 70–99)
Potassium: 3.6 mmol/L (ref 3.5–5.1)
Sodium: 131 mmol/L — ABNORMAL LOW (ref 135–145)

## 2021-04-08 LAB — CBG MONITORING, ED: Glucose-Capillary: 151 mg/dL — ABNORMAL HIGH (ref 70–99)

## 2021-04-08 MED ORDER — SODIUM CHLORIDE 0.9 % IV BOLUS
1000.0000 mL | Freq: Once | INTRAVENOUS | Status: AC
  Administered 2021-04-08: 1000 mL via INTRAVENOUS

## 2021-04-08 MED ORDER — DOXYCYCLINE HYCLATE 100 MG PO CAPS: 100.0000 mg | ORAL_CAPSULE | Freq: Two times a day (BID) | ORAL | 0 refills | Status: DC

## 2021-04-08 NOTE — ED Notes (Signed)
Patient transported to  mri 

## 2021-04-08 NOTE — ED Triage Notes (Signed)
Pt arrived by [facility]. Pt had a syncopal episode striking his head to the ground. Denies taking thinners. CBG 191 w/ EMS . No orthostatic changes w/ EMS.

## 2021-04-08 NOTE — ED Notes (Signed)
No pacemaker present  

## 2021-04-08 NOTE — ED Provider Notes (Signed)
[facility] EMERGENCY DEPARTMENT Provider Note   CSN: [id number] Arrival date & time: 04/08/21  1116     History Chief Complaint  Patient presents with   Near Syncope   Loss of Consciousness    Jonathon Adkins is a 70 y.o. male.  HPI  Patient presents with episode of syncope/fall.  States he was mowing the yard when he started to feel lightheaded, he went to sit down but fell backwards hitting his head and losing consciousness.  He does not remember falling, but awoke with his son helping him up.  He has a headache from the fall, but denies any other symptoms.  He was not feeling short of breath or having any chest pain prior to falling.  States she has had episodes of this 2 years ago, has not had one since then.  Unclear if there is any aggravating symptoms other than mowing his lawn, he denies alleviating factors. No recent medication changes.   Of additional concern, patient has had a large bump on the back of his neck for multiple years. Noticed draining two days ago, thinks he has a cyst.  Has not followed up with his primary care doctor about it, denies any fevers or chills.  Past Medical History:  Diagnosis Date   Diabetes mellitus without complication ([place])    Enlarged prostate    Hyperlipidemia    Hypertension     Patient Active Problem List   Diagnosis Date Noted   Blistering of skin 04/24/2020   BMI 32.0-32.9,adult 03/28/2015   BPH (benign prostatic hyperplasia) 05/11/2013   Hypertension 01/29/2013   Hyperlipidemia 01/29/2013   Diabetes ([place]) 01/29/2013    Past Surgical History:  Procedure Laterality Date   COLONOSCOPY N/A 02/06/2014   Procedure: COLONOSCOPY;  Surgeon: Rogene Houston, MD;  Location: [facility];  Service: Endoscopy;  Laterality: N/A;  100       Family History  Problem Relation Age of Onset   Diabetes Mother    Coronary artery disease Father     Social History   Tobacco Use   Smoking status: Never   Smokeless tobacco: Never   Substance Use Topics   Alcohol use: No   Drug use: No    Home Medications Prior to Admission medications   Medication Sig Start Date End Date Taking? Authorizing Provider  amLODipine (NORVASC) 10 MG tablet Take 1 tablet (10 mg total) by mouth daily. 10/31/20   Hassell Done, Mary-Margaret, FNP  atorvastatin (LIPITOR) 40 MG tablet Take 1 tablet (40 mg total) by mouth daily. 10/31/20   Hassell Done Mary-Margaret, FNP  augmented betamethasone dipropionate (DIPROLENE-AF) 0.05 % ointment Apply topically. 09/05/20 09/05/21  [provider]  cholecalciferol (VITAMIN D) 1000 UNITS tablet Take 1 tablet (1,000 Units total) by mouth daily. 11/26/13   Hassell Done, Mary-Margaret, FNP  finasteride (PROSCAR) 5 MG tablet Take 1 tablet (5 mg total) by mouth daily. 10/31/20   Hassell Done, Mary-Margaret, FNP  hydrochlorothiazide (HYDRODIURIL) 25 MG tablet Take 1 tablet (25 mg total) by mouth daily. 10/31/20   Hassell Done, Mary-Margaret, FNP  hydrocortisone cream 1 % Apply 1 application topically 2 (two) times daily.    [provider]  losartan (COZAAR) 50 MG tablet Take 1 tablet (50 mg total) by mouth daily. 10/31/20   Hassell Done Mary-Margaret, FNP  metFORMIN (GLUCOPHAGE) 500 MG tablet Take 1 tablet (500 mg total) by mouth 2 (two) times daily with a meal. 10/31/20   Hassell Done, Mary-Margaret, FNP    Allergies    Codeine and Lisinopril  Review of Systems   Review of Systems  Constitutional:  Negative for chills, fatigue and fever.  HENT:  Negative for ear pain and sore throat.   Eyes:  Negative for pain and visual disturbance.  Respiratory:  Negative for cough and shortness of breath.   Cardiovascular:  Negative for chest pain and palpitations.  Gastrointestinal:  Negative for abdominal pain and vomiting.  Genitourinary:  Negative for dysuria and hematuria.  Musculoskeletal:  Negative for arthralgias and back pain.  Skin:  Positive for wound. Negative for color change.       Abscess to neck   Neurological:  Positive for  syncope and light-headedness. Negative for seizures.  All other systems reviewed and are negative.  Physical Exam Updated Vital Signs BP 136/73   Pulse 88   Temp 98.4 F (36.9 C) (Oral)   Resp 15   Ht '6\' 1"'$  (1.854 m)   Wt 93 kg   SpO2 96%   BMI 27.05 kg/m   Physical Exam Vitals and nursing note reviewed. Exam conducted with a chaperone present.  Constitutional:      Appearance: Normal appearance.  HENT:     Head: Normocephalic.     Comments: Small abrasion to the posterior occiput, no active bleeding.  No laceration requiring repair.  No raccoon eyes, no hemotympanums.  No crepitus to TMJ. Eyes:     General: No scleral icterus.       Right eye: No discharge.        Left eye: No discharge.     Extraocular Movements: Extraocular movements intact.     Pupils: Pupils are equal, round, and reactive to light.  Cardiovascular:     Rate and Rhythm: Regular rhythm. Tachycardia present.     Pulses: Normal pulses.     Heart sounds: Normal heart sounds. No murmur heard.   No friction rub. No gallop.  Pulmonary:     Effort: Pulmonary effort is normal. No respiratory distress.     Breath sounds: Normal breath sounds.     Comments: Lungs are clear to auscultation bilaterally Abdominal:     General: Abdomen is flat. Bowel sounds are normal. There is no distension.     Palpations: Abdomen is soft.     Tenderness: There is no abdominal tenderness.  Skin:    General: Skin is warm and dry.     Coloration: Skin is not jaundiced.     Comments: Golf ball size abscess to the neck, open wound with obvious drainage.  large amount of exudate expressed with light gentle pressure, no surrounding erythema.  Neurological:     Mental Status: He is alert. Mental status is at baseline.     Coordination: Coordination normal.     Comments: Cranial nerves II through XII are grossly intact.  Grip strength is equal bilaterally.    ED Results / Procedures / Treatments   Labs (all labs ordered are  listed, but only abnormal results are displayed) Labs Reviewed  BASIC METABOLIC PANEL  CBC WITH DIFFERENTIAL/PLATELET  CBG MONITORING, ED    EKG None  Radiology No results found.  Procedures Procedures   Medications Ordered in ED Medications - No data to display  ED Course  I have reviewed the triage vital signs and the nursing notes.  Pertinent labs & imaging results that were available during my care of the patient were reviewed by me and considered in my medical decision making (see chart for details).  Clinical Course as of 04/08/21 1916  Wed  Apr 08, 2021  [id number] Basic metabolic panel(!) Patient is mildly hyponatremic, being given fluids.  He has mildly elevated creatinine, this appears to be his baseline per chart review.  Will discuss with the patient and advise follow-up with his primary care physician. [HS]  1258 CBC WITH DIFFERENTIAL(!) No leukocytosis, mild anemia [HS]  1258 CT Head Wo Contrast Subdural with subsequent finding of possible subacute stroke, MRI ordered for better evaluation. [HS]  1258 CBG monitoring, ED(!) No hypoglycemia [HS]    Clinical Course User Index [HS] Sherrill Raring, PA-C   MDM Rules/Calculators/A&P                           Patient vitals are stable, he is nontoxic-appearing.  There are no focal deficits on neuro exam, he did have an abscess versus infected sebaceous cyst to his neck.  This drained with minimal pressure, formal I&D not performed.  We did irrigated thoroughly, will start on doxycycline.  He is not cellulitic, no but not febrile.  Do not suspect sepsis.  Physical exam reassuring, no neurofocal deficits.  Overall work-up is reassuring, no leukocytosis or anemia.  No electrolyte derangement.  CT head does show a small subdural, will get an MRI to better visualize.  We will also visualize possible subacute infarct.  MRI without signs of subacute infarct.  There is a small subdural, will consult with neurosurgery.  Spoke with  Dr. Christella Noa with neurosurgery -he states patient is appropriate for discharge with return precautions.  He does not see a reason for the patient to follow-up with neurosurgery outpatient.  Patient is appropriate for discharge at this time.  Strict return precautions given, patient verbalized understanding and agreement.  She he is following up with his primary care doctor next week, wound check will be performed at that time unless he does develop signs of infection.  He is agreed to return back if there are any signs of infection.  Final Clinical Impression(s) / ED Diagnoses Final diagnoses:  None    Rx / DC Orders ED Discharge Orders     None        Sherrill Raring, Hershal Coria 04/08/21 1918    Davonna Belling, MD 04/09/21 [phone]

## 2021-04-08 NOTE — Discharge Instructions (Addendum)
Take doxycycline twice daily for the next 10 days.  This is an antibiotic that will help cure out the remaining infection.  It can make you sun sensitive to light, so I recommend wearing sunscreen and avoiding the sun if possible. Please help with your primary care doctor for wound recheck at the end of this week or early next week.  If you develop fevers, chills, spreading infection signs please turn back to the ED for further evaluation.  You have a small subdural hematoma, this should resolve on its own.  He started having mental status changes and acting confused, having nausea and vomiting, having vision changes these are recent return back to the ED for further evaluation.

## 2021-04-08 NOTE — ED Notes (Signed)
ED Provider at bedside. 

## 2021-04-08 NOTE — Progress Notes (Signed)
Patient ID: Jonathon Adkins, male   DOB: [birth year], 70 y.o.   MRN: [id number] BP (!) 146/74   Pulse 97   Temp 98.4 F (36.9 C) (Oral)   Resp 16   Ht '6\' 1"'$  (1.854 m)   Wt 93 kg   SpO2 100%   BMI 27.05 kg/m  Films reviewed. The subdural hematoma is quite small, and will not progress to an operative state. I believe it is safe for discharge to a situation where he will be with someone continuously for the next 24 hours.

## 2021-04-08 NOTE — ED Notes (Signed)
Patient transported to CT 

## 2021-05-04 ENCOUNTER — Encounter: Payer: Self-pay | Admitting: Nurse Practitioner

## 2021-05-04 ENCOUNTER — Ambulatory Visit (INDEPENDENT_AMBULATORY_CARE_PROVIDER_SITE_OTHER): Payer: Medicare HMO | Admitting: Nurse Practitioner

## 2021-05-04 ENCOUNTER — Other Ambulatory Visit: Payer: Self-pay

## 2021-05-04 VITALS — BP 145/84 | HR 84 | Temp 97.9°F | Resp 20 | Ht 73.0 in | Wt 202.0 lb

## 2021-05-04 DIAGNOSIS — I10 Essential (primary) hypertension: Secondary | ICD-10-CM | POA: Diagnosis not present

## 2021-05-04 DIAGNOSIS — R300 Dysuria: Secondary | ICD-10-CM | POA: Diagnosis not present

## 2021-05-04 DIAGNOSIS — E119 Type 2 diabetes mellitus without complications: Secondary | ICD-10-CM

## 2021-05-04 DIAGNOSIS — Z6826 Body mass index (BMI) 26.0-26.9, adult: Secondary | ICD-10-CM | POA: Diagnosis not present

## 2021-05-04 DIAGNOSIS — E782 Mixed hyperlipidemia: Secondary | ICD-10-CM

## 2021-05-04 DIAGNOSIS — N400 Enlarged prostate without lower urinary tract symptoms: Secondary | ICD-10-CM

## 2021-05-04 LAB — URINALYSIS, COMPLETE
Bilirubin, UA: NEGATIVE
Glucose, UA: NEGATIVE
Leukocytes,UA: NEGATIVE
Nitrite, UA: NEGATIVE
Protein,UA: NEGATIVE
RBC, UA: NEGATIVE
Specific Gravity, UA: 1.02 (ref 1.005–1.030)
Urobilinogen, Ur: 0.2 mg/dL (ref 0.2–1.0)
pH, UA: 5.5 (ref 5.0–7.5)

## 2021-05-04 LAB — MICROSCOPIC EXAMINATION
RBC, Urine: NONE SEEN /hpf (ref 0–2)
Renal Epithel, UA: NONE SEEN /hpf

## 2021-05-04 LAB — BAYER DCA HB A1C WAIVED: HB A1C (BAYER DCA - WAIVED): 6.3 % — ABNORMAL HIGH (ref 4.8–5.6)

## 2021-05-04 MED ORDER — HYDROCHLOROTHIAZIDE 25 MG PO TABS: 25.0000 mg | ORAL_TABLET | Freq: Every day | ORAL | 1 refills | Status: DC

## 2021-05-04 MED ORDER — METFORMIN HCL 500 MG PO TABS: 500.0000 mg | ORAL_TABLET | Freq: Two times a day (BID) | ORAL | 1 refills | Status: DC

## 2021-05-04 MED ORDER — LOSARTAN POTASSIUM 50 MG PO TABS: 50.0000 mg | ORAL_TABLET | Freq: Every day | ORAL | 1 refills | Status: DC

## 2021-05-04 MED ORDER — ATORVASTATIN CALCIUM 40 MG PO TABS: 40.0000 mg | ORAL_TABLET | Freq: Every day | ORAL | 1 refills | Status: DC

## 2021-05-04 MED ORDER — FINASTERIDE 5 MG PO TABS: 5.0000 mg | ORAL_TABLET | Freq: Every day | ORAL | 1 refills | Status: DC

## 2021-05-04 MED ORDER — AMLODIPINE BESYLATE 10 MG PO TABS: 10.0000 mg | ORAL_TABLET | Freq: Every day | ORAL | 1 refills | Status: DC

## 2021-05-04 NOTE — Progress Notes (Signed)
Subjective:    Patient ID: Jonathon Adkins, male    DOB: May 12, 1951, 70 y.o.   MRN: [id number]  Chief Complaint: Medical Management of Chronic Issues    HPI:  1. Primary hypertension NO c/o chest pain, sob or headache. He doe snot check bloo dpressure at home. BP Readings from Last 3 Encounters:  05/04/21 (!) 145/84  04/08/21 (!) 147/79  10/31/20 126/73     2. Mixed hyperlipidemia Does watch diet and staysa very active. Lab Results  Component Value Date   CHOL 151 10/31/2020   HDL 49 10/31/2020   LDLCALC 86 10/31/2020   TRIG 83 10/31/2020   CHOLHDL 3.1 10/31/2020     3. Type 2 diabetes mellitus without complication, without long-term current use of insulin (Jonathon Adkins) He doe snot check his blood sugars at home. He limits his sweets. Lab Results  Component Value Date   HGBA1C 6.7 10/31/2020     4. Dysuria Slight dysuria. Wants urine checked today  5. Benign prostatic hyperplasia without lower urinary tract symptoms No problems voiding  6. BMI 27.0-27.9,adult Weight is down 3 lbs. Wt Readings from Last 3 Encounters:  05/04/21 202 lb (91.6 kg)  04/08/21 205 lb (93 kg)  10/31/20 204 lb (92.5 kg)   BMI Readings from Last 3 Encounters:  05/04/21 26.65 kg/m  04/08/21 27.05 kg/m  10/31/20 26.91 kg/m        Outpatient Encounter Medications as of 05/04/2021  Medication Sig   amLODipine (NORVASC) 10 MG tablet Take 1 tablet (10 mg total) by mouth daily.   atorvastatin (LIPITOR) 40 MG tablet Take 1 tablet (40 mg total) by mouth daily.   augmented betamethasone dipropionate (DIPROLENE-AF) 0.05 % ointment Apply topically.   cholecalciferol (VITAMIN D) 1000 UNITS tablet Take 1 tablet (1,000 Units total) by mouth daily.   finasteride (PROSCAR) 5 MG tablet Take 1 tablet (5 mg total) by mouth daily.   hydrochlorothiazide (HYDRODIURIL) 25 MG tablet Take 1 tablet (25 mg total) by mouth daily.   hydrocortisone cream 1 % Apply 1 application topically 2 (two) times daily.    losartan (COZAAR) 50 MG tablet Take 1 tablet (50 mg total) by mouth daily.   metFORMIN (GLUCOPHAGE) 500 MG tablet Take 1 tablet (500 mg total) by mouth 2 (two) times daily with a meal.   [DISCONTINUED] doxycycline (VIBRAMYCIN) 100 MG capsule Take 1 capsule (100 mg total) by mouth 2 (two) times daily.   No facility-administered encounter medications on file as of 05/04/2021.    Past Surgical History:  Procedure Laterality Date   COLONOSCOPY N/A 02/06/2014   Procedure: COLONOSCOPY;  Surgeon: Rogene Houston, MD;  Location: [facility];  Service: Endoscopy;  Laterality: N/A;  100    Family History  Problem Relation Age of Onset   Diabetes Mother    Coronary artery disease Father     New complaints: He fell and hit his head a month ago and had to go to the hospital. He had a subdural hematoma. Denies any headaches, blurred vision and nausea and vomiting.   Social history: Lives with is wife of 31 years  Controlled substance contract: n/a     Review of Systems  Constitutional:  Negative for diaphoresis.  Eyes:  Negative for pain.  Respiratory:  Negative for shortness of breath.   Cardiovascular:  Negative for chest pain, palpitations and leg swelling.  Gastrointestinal:  Negative for abdominal pain.  Endocrine: Negative for polydipsia.  Skin:  Negative for rash.  Neurological:  Negative  for dizziness, weakness and headaches.  Hematological:  Does not bruise/bleed easily.  All other systems reviewed and are negative.     Objective:   Physical Exam Vitals and nursing note reviewed.  Constitutional:      Appearance: Normal appearance. He is well-developed.  HENT:     Head: Normocephalic.     Nose: Nose normal.  Eyes:     Pupils: Pupils are equal, round, and reactive to light.  Neck:     Thyroid: No thyroid mass or thyromegaly.     Vascular: No carotid bruit or JVD.     Trachea: Phonation normal.  Cardiovascular:     Rate and Rhythm: Normal rate and regular rhythm.   Pulmonary:     Effort: Pulmonary effort is normal. No respiratory distress.     Breath sounds: Normal breath sounds.  Abdominal:     General: Bowel sounds are normal.     Palpations: Abdomen is soft.     Tenderness: There is no abdominal tenderness.  Musculoskeletal:        General: Normal range of motion.     Cervical back: Normal range of motion and neck supple.  Lymphadenopathy:     Cervical: No cervical adenopathy.  Skin:    General: Skin is warm and dry.  Neurological:     Mental Status: He is alert and oriented to person, place, and time.  Psychiatric:        Behavior: Behavior normal.        Thought Content: Thought content normal.        Judgment: Judgment normal.    HGBA1c 6.3%  BP (!) 145/84   Pulse 84   Temp 97.9 F (36.6 C) (Temporal)   Resp 20   Ht _0  (1.854 m)   Wt 202 lb (91.6 kg)   SpO2 100%   BMI 26.65 kg/m      Assessment & Plan:  Jonathon Adkins comes in today with chief complaint of Medical Management of Chronic Issues   Diagnosis and orders addressed:  1. Primary hypertension Low sodium diet - CBC with Differential/Platelet - CMP14+EGFR - losartan (COZAAR) 50 MG tablet; Take 1 tablet (50 mg total) by mouth daily.  Dispense: 90 tablet; Refill: 1 - hydrochlorothiazide (HYDRODIURIL) 25 MG tablet; Take 1 tablet (25 mg total) by mouth daily.  Dispense: 90 tablet; Refill: 1 - amLODipine (NORVASC) 10 MG tablet; Take 1 tablet (10 mg total) by mouth daily.  Dispense: 90 tablet; Refill: 1  2. Mixed hyperlipidemia Low fat diet - Lipid panel - atorvastatin (LIPITOR) 40 MG tablet; Take 1 tablet (40 mg total) by mouth daily.  Dispense: 90 tablet; Refill: 1  3. Type 2 diabetes mellitus without complication, without long-term current use of insulin ([facility]) Continue to watch carbs in diet - Bayer DCA Hb A1c Waived - metFORMIN (GLUCOPHAGE) 500 MG tablet; Take 1 tablet (500 mg total) by mouth 2 (two) times daily with a meal.  Dispense: 180 tablet; Refill:  1  4. Dysuria Urine clear - Urinalysis, Complete  5. Benign prostatic hyperplasia without lower urinary tract symptoms Report any problems voiding - finasteride (PROSCAR) 5 MG tablet; Take 1 tablet (5 mg total) by mouth daily.  Dispense: 90 tablet; Refill: 1  6. BMI 26.0-26.9,adult Discussed diet and exercise for person with BMI >25 Will recheck weight in 3-6 months    Labs pending Health Maintenance reviewed Diet and exercise encouraged  Follow up plan: 3 months   [name] Done, FNP

## 2021-05-05 LAB — CMP14+EGFR
ALT: 13 IU/L (ref 0–44)
AST: 16 IU/L (ref 0–40)
Albumin/Globulin Ratio: 1.3 (ref 1.2–2.2)
Albumin: 4 g/dL (ref 3.7–4.7)
Alkaline Phosphatase: 82 IU/L (ref 44–121)
BUN/Creatinine Ratio: 12 (ref 10–24)
BUN: 17 mg/dL (ref 8–27)
Bilirubin Total: 0.7 mg/dL (ref 0.0–1.2)
CO2: 23 mmol/L (ref 20–29)
Calcium: 9.7 mg/dL (ref 8.6–10.2)
Chloride: 102 mmol/L (ref 96–106)
Creatinine, Ser: 1.41 mg/dL — ABNORMAL HIGH (ref 0.76–1.27)
Globulin, Total: 3 g/dL (ref 1.5–4.5)
Glucose: 135 mg/dL — ABNORMAL HIGH (ref 65–99)
Potassium: 4.6 mmol/L (ref 3.5–5.2)
Sodium: 140 mmol/L (ref 134–144)
Total Protein: 7 g/dL (ref 6.0–8.5)
eGFR: 53 mL/min/{1.73_m2} — ABNORMAL LOW (ref >59)

## 2021-05-05 LAB — CBC WITH DIFFERENTIAL/PLATELET
Basophils Absolute: 0 10*3/uL (ref 0.0–0.2)
Basos: 1 %
EOS (ABSOLUTE): 0.1 10*3/uL (ref 0.0–0.4)
Eos: 2 %
Hematocrit: 39.3 % (ref 37.5–51.0)
Hemoglobin: 12.7 g/dL — ABNORMAL LOW (ref 13.0–17.7)
Immature Grans (Abs): 0 10*3/uL (ref 0.0–0.1)
Immature Granulocytes: 1 %
Lymphocytes Absolute: 1.5 10*3/uL (ref 0.7–3.1)
Lymphs: 31 %
MCH: 28.7 pg (ref 26.6–33.0)
MCHC: 32.3 g/dL (ref 31.5–35.7)
MCV: 89 fL (ref 79–97)
Monocytes Absolute: 0.5 10*3/uL (ref 0.1–0.9)
Monocytes: 9 %
Neutrophils Absolute: 2.8 10*3/uL (ref 1.4–7.0)
Neutrophils: 56 %
Platelets: 252 10*3/uL (ref 150–450)
RBC: 4.43 x10E6/uL (ref 4.14–5.80)
RDW: 13.2 % (ref 11.6–15.4)
WBC: 5 10*3/uL (ref 3.4–10.8)

## 2021-05-05 LAB — LIPID PANEL
Chol/HDL Ratio: 3.1 ratio (ref 0.0–5.0)
Cholesterol, Total: 151 mg/dL (ref 100–199)
HDL: 49 mg/dL (ref >39)
LDL Chol Calc (NIH): 88 mg/dL (ref 0–99)
Triglycerides: 72 mg/dL (ref 0–149)
VLDL Cholesterol Cal: 14 mg/dL (ref 5–40)

## 2021-06-16 DIAGNOSIS — H524 Presbyopia: Secondary | ICD-10-CM | POA: Diagnosis not present

## 2021-06-16 DIAGNOSIS — H35033 Hypertensive retinopathy, bilateral: Secondary | ICD-10-CM | POA: Diagnosis not present

## 2021-06-16 LAB — HM DIABETES EYE EXAM

## 2021-08-03 ENCOUNTER — Encounter: Payer: Self-pay | Admitting: Nurse Practitioner

## 2021-08-03 ENCOUNTER — Ambulatory Visit (INDEPENDENT_AMBULATORY_CARE_PROVIDER_SITE_OTHER): Payer: Medicare HMO | Admitting: Nurse Practitioner

## 2021-08-03 VITALS — BP 138/87 | HR 83 | Temp 98.3°F | Resp 20 | Ht 73.0 in | Wt 203.0 lb

## 2021-08-03 DIAGNOSIS — E782 Mixed hyperlipidemia: Secondary | ICD-10-CM

## 2021-08-03 DIAGNOSIS — N400 Enlarged prostate without lower urinary tract symptoms: Secondary | ICD-10-CM

## 2021-08-03 DIAGNOSIS — I10 Essential (primary) hypertension: Secondary | ICD-10-CM | POA: Diagnosis not present

## 2021-08-03 DIAGNOSIS — E119 Type 2 diabetes mellitus without complications: Secondary | ICD-10-CM | POA: Diagnosis not present

## 2021-08-03 DIAGNOSIS — Z6832 Body mass index (BMI) 32.0-32.9, adult: Secondary | ICD-10-CM

## 2021-08-03 LAB — CBC WITH DIFFERENTIAL/PLATELET
Basophils Absolute: 0.1 10*3/uL (ref 0.0–0.2)
Basos: 1 %
EOS (ABSOLUTE): 0.1 10*3/uL (ref 0.0–0.4)
Eos: 1 %
Hematocrit: 40.1 % (ref 37.5–51.0)
Hemoglobin: 13.4 g/dL (ref 13.0–17.7)
Immature Grans (Abs): 0 10*3/uL (ref 0.0–0.1)
Immature Granulocytes: 0 %
Lymphocytes Absolute: 1.5 10*3/uL (ref 0.7–3.1)
Lymphs: 25 %
MCH: 28.3 pg (ref 26.6–33.0)
MCHC: 33.4 g/dL (ref 31.5–35.7)
MCV: 85 fL (ref 79–97)
Monocytes Absolute: 0.4 10*3/uL (ref 0.1–0.9)
Monocytes: 7 %
Neutrophils Absolute: 3.8 10*3/uL (ref 1.4–7.0)
Neutrophils: 66 %
Platelets: 293 10*3/uL (ref 150–450)
RBC: 4.74 x10E6/uL (ref 4.14–5.80)
RDW: 13.1 % (ref 11.6–15.4)
WBC: 5.8 10*3/uL (ref 3.4–10.8)

## 2021-08-03 LAB — LIPID PANEL
Chol/HDL Ratio: 3.2 ratio (ref 0.0–5.0)
Cholesterol, Total: 153 mg/dL (ref 100–199)
HDL: 48 mg/dL (ref >39)
LDL Chol Calc (NIH): 90 mg/dL (ref 0–99)
Triglycerides: 77 mg/dL (ref 0–149)
VLDL Cholesterol Cal: 15 mg/dL (ref 5–40)

## 2021-08-03 LAB — CMP14+EGFR
ALT: 12 IU/L (ref 0–44)
AST: 15 IU/L (ref 0–40)
Albumin/Globulin Ratio: 1.4 (ref 1.2–2.2)
Albumin: 4.2 g/dL (ref 3.7–4.7)
Alkaline Phosphatase: 109 IU/L (ref 44–121)
BUN/Creatinine Ratio: 14 (ref 10–24)
BUN: 20 mg/dL (ref 8–27)
Bilirubin Total: 0.6 mg/dL (ref 0.0–1.2)
CO2: 23 mmol/L (ref 20–29)
Calcium: 10 mg/dL (ref 8.6–10.2)
Chloride: 102 mmol/L (ref 96–106)
Creatinine, Ser: 1.38 mg/dL — ABNORMAL HIGH (ref 0.76–1.27)
Globulin, Total: 3.1 g/dL (ref 1.5–4.5)
Glucose: 111 mg/dL — ABNORMAL HIGH (ref 70–99)
Potassium: 4.5 mmol/L (ref 3.5–5.2)
Sodium: 139 mmol/L (ref 134–144)
Total Protein: 7.3 g/dL (ref 6.0–8.5)
eGFR: 54 mL/min/{1.73_m2} — ABNORMAL LOW (ref >59)

## 2021-08-03 LAB — BAYER DCA HB A1C WAIVED: HB A1C (BAYER DCA - WAIVED): 6.6 % — ABNORMAL HIGH (ref 4.8–5.6)

## 2021-08-03 MED ORDER — ATORVASTATIN CALCIUM 40 MG PO TABS: 40.0000 mg | ORAL_TABLET | Freq: Every day | ORAL | 1 refills | Status: DC

## 2021-08-03 MED ORDER — HYDROCHLOROTHIAZIDE 25 MG PO TABS: 25.0000 mg | ORAL_TABLET | Freq: Every day | ORAL | 1 refills | Status: DC

## 2021-08-03 MED ORDER — FINASTERIDE 5 MG PO TABS: 5.0000 mg | ORAL_TABLET | Freq: Every day | ORAL | 1 refills | Status: DC

## 2021-08-03 MED ORDER — METFORMIN HCL 500 MG PO TABS: 500.0000 mg | ORAL_TABLET | Freq: Two times a day (BID) | ORAL | 1 refills | Status: DC

## 2021-08-03 MED ORDER — LOSARTAN POTASSIUM 50 MG PO TABS: 50.0000 mg | ORAL_TABLET | Freq: Every day | ORAL | 1 refills | Status: DC

## 2021-08-03 MED ORDER — AMLODIPINE BESYLATE 10 MG PO TABS: 10.0000 mg | ORAL_TABLET | Freq: Every day | ORAL | 1 refills | Status: DC

## 2021-08-03 NOTE — Progress Notes (Signed)
Subjective:    Patient ID: Jonathon Adkins, male    DOB: 04/11/1951, 70 y.o.   MRN: [id number]   Chief Complaint: medical management of chronic issues     HPI:  1. Primary hypertension No c/o chest pain, sob or headache. Does not check blood pressure at home. BP Readings from Last 3 Encounters:  08/03/21 138/87  05/04/21 (!) 145/84  04/08/21 (!) 147/79      2. Mixed hyperlipidemia Does try to watch diet and stays very active. Lab Results  Component Value Date   CHOL 151 05/04/2021   HDL 49 05/04/2021   LDLCALC 88 05/04/2021   TRIG 72 05/04/2021   CHOLHDL 3.1 05/04/2021     3. Type 2 diabetes mellitus without complication, without long-term current use of insulin ([place]) He has not been checking his blood sugars at home. He does try to watch diet. Lab Results  Component Value Date   HGBA1C 6.3 (H) 05/04/2021     4. Benign prostatic hyperplasia without lower urinary tract symptoms No problems voiding Lab Results  Component Value Date   PSA1 4.2 (H) 10/31/2020   PSA1 3.0 10/15/2019   PSA1 3.0 11/01/2016      5. BMI 32.0-32.9,adult No recent weight changes. Wt Readings from Last 3 Encounters:  08/03/21 203 lb (92.1 kg)  05/04/21 202 lb (91.6 kg)  04/08/21 205 lb (93 kg)   BMI Readings from Last 3 Encounters:  08/03/21 26.78 kg/m  05/04/21 26.65 kg/m  04/08/21 27.05 kg/m       Outpatient Encounter Medications as of 08/03/2021  Medication Sig   amLODipine (NORVASC) 10 MG tablet Take 1 tablet (10 mg total) by mouth daily.   atorvastatin (LIPITOR) 40 MG tablet Take 1 tablet (40 mg total) by mouth daily.   augmented betamethasone dipropionate (DIPROLENE-AF) 0.05 % ointment Apply topically.   cholecalciferol (VITAMIN D) 1000 UNITS tablet Take 1 tablet (1,000 Units total) by mouth daily.   finasteride (PROSCAR) 5 MG tablet Take 1 tablet (5 mg total) by mouth daily.   hydrochlorothiazide (HYDRODIURIL) 25 MG tablet Take 1 tablet (25 mg total) by mouth  daily.   hydrocortisone cream 1 % Apply 1 application topically 2 (two) times daily.   losartan (COZAAR) 50 MG tablet Take 1 tablet (50 mg total) by mouth daily.   metFORMIN (GLUCOPHAGE) 500 MG tablet Take 1 tablet (500 mg total) by mouth 2 (two) times daily with a meal.   No facility-administered encounter medications on file as of 08/03/2021.    Past Surgical History:  Procedure Laterality Date   COLONOSCOPY N/A 02/06/2014   Procedure: COLONOSCOPY;  Surgeon: Rogene Houston, MD;  Location: [facility];  Service: Endoscopy;  Laterality: N/A;  100    Family History  Problem Relation Age of Onset   Diabetes Mother    Coronary artery disease Father     New complaints: None today  Social history: Lives with is wife  Controlled substance contract: n/a     Review of Systems  Constitutional:  Negative for diaphoresis.  Eyes:  Negative for pain.  Respiratory:  Negative for shortness of breath.   Cardiovascular:  Negative for chest pain, palpitations and leg swelling.  Gastrointestinal:  Negative for abdominal pain.  Endocrine: Negative for polydipsia.  Skin:  Negative for rash.  Neurological:  Negative for dizziness, weakness and headaches.  Hematological:  Does not bruise/bleed easily.  All other systems reviewed and are negative.     Objective:   Physical Exam  Vitals and nursing note reviewed.  Constitutional:      Appearance: Normal appearance. He is well-developed.  HENT:     Head: Normocephalic.     Nose: Nose normal.     Mouth/Throat:     Mouth: Mucous membranes are moist.     Pharynx: Oropharynx is clear.  Eyes:     Pupils: Pupils are equal, round, and reactive to light.  Neck:     Thyroid: No thyroid mass or thyromegaly.     Vascular: No carotid bruit or JVD.     Trachea: Phonation normal.  Cardiovascular:     Rate and Rhythm: Normal rate and regular rhythm.  Pulmonary:     Effort: Pulmonary effort is normal. No respiratory distress.     Breath  sounds: Normal breath sounds.  Abdominal:     General: Bowel sounds are normal.     Palpations: Abdomen is soft.     Tenderness: There is no abdominal tenderness.  Musculoskeletal:        General: Normal range of motion.     Cervical back: Normal range of motion and neck supple.  Lymphadenopathy:     Cervical: No cervical adenopathy.  Skin:    General: Skin is warm and dry.  Neurological:     Mental Status: He is alert and oriented to person, place, and time.  Psychiatric:        Behavior: Behavior normal.        Thought Content: Thought content normal.        Judgment: Judgment normal.    BP 138/87   Pulse 83   Temp 98.3 F (36.8 C) (Temporal)   Resp 20   Ht 6' 1" (1.854 m)   Wt 203 lb (92.1 kg)   SpO2 99%   BMI 26.78 kg/m   Hgba1c 6.6     Assessment & Plan:  Jonathon Adkins comes in today with chief complaint of Medical Management of Chronic Issues   Diagnosis and orders addressed:  1. Primary hypertension Low sodium diet - CBC with Differential/Platelet - CMP14+EGFR - losartan (COZAAR) 50 MG tablet; Take 1 tablet (50 mg total) by mouth daily.  Dispense: 90 tablet; Refill: 1 - hydrochlorothiazide (HYDRODIURIL) 25 MG tablet; Take 1 tablet (25 mg total) by mouth daily.  Dispense: 90 tablet; Refill: 1 - amLODipine (NORVASC) 10 MG tablet; Take 1 tablet (10 mg total) by mouth daily.  Dispense: 90 tablet; Refill: 1  2. Mixed hyperlipidemia Low fat diet - Lipid panel - atorvastatin (LIPITOR) 40 MG tablet; Take 1 tablet (40 mg total) by mouth daily.  Dispense: 90 tablet; Refill: 1  3. Type 2 diabetes mellitus without complication, without long-term current use of insulin ([facility]CC) Continue to watch carbs in diet - Bayer DCA Hb A1c Waived - metFORMIN (GLUCOPHAGE) 500 MG tablet; Take 1 tablet (500 mg total) by mouth 2 (two) times daily with a meal.  Dispense: 180 tablet; Refill: 1  4. Benign prostatic hyperplasia without lower urinary tract symptoms Report any problems  voiding - finasteride (PROSCAR) 5 MG tablet; Take 1 tablet (5 mg total) by mouth daily.  Dispense: 90 tablet; Refill: 1  5. BMI 32.0-32.9,adult Discussed diet and exercise for person with BMI >25 Will recheck weight in 3-6 months    Labs pending Health Maintenance reviewed Diet and exercise encouraged  Follow up plan: 6 months   [name] Done, FNP

## 2021-08-03 NOTE — Patient Instructions (Signed)
Exercising to Stay Healthy °To become healthy and stay healthy, it is recommended that you do moderate-intensity and vigorous-intensity exercise. You can tell that you are exercising at a moderate intensity if your heart starts beating faster and you start breathing faster but can still hold a conversation. You can tell that you are exercising at a vigorous intensity if you are breathing much harder and faster and cannot hold a conversation while exercising. °How can exercise benefit me? °Exercising regularly is important. It has many health benefits, such as: °Improving overall fitness, flexibility, and endurance. °Increasing bone density. °Helping with weight control. °Decreasing body fat. °Increasing muscle strength and endurance. °Reducing stress and tension, anxiety, depression, or anger. °Improving overall health. °What guidelines should I follow while exercising? °Before you start a new exercise program, talk with your health care provider. °Do not exercise so much that you hurt yourself, feel dizzy, or get very short of breath. °Wear comfortable clothes and wear shoes with good support. °Drink plenty of water while you exercise to prevent dehydration or heat stroke. °Work out until your breathing and your heartbeat get faster (moderate intensity). °How often should I exercise? °Choose an activity that you enjoy, and set realistic goals. Your health care provider can help you make an activity plan that is individually designed and works best for you. °Exercise regularly as told by your health care provider. This may include: °Doing strength training two times a week, such as: °Lifting weights. °Using resistance bands. °Push-ups. °Sit-ups. °Yoga. °Doing a certain intensity of exercise for a given amount of time. Choose from these options: °A total of 150 minutes of moderate-intensity exercise every week. °A total of 75 minutes of vigorous-intensity exercise every week. °A mix of moderate-intensity and  vigorous-intensity exercise every week. °Children, pregnant women, people who have not exercised regularly, people who are overweight, and older adults may need to talk with a health care provider about what activities are safe to perform. If you have a medical condition, be sure to talk with your health care provider before you start a new exercise program. °What are some exercise ideas? °Moderate-intensity exercise ideas include: °Walking 1 mile (1.6 km) in about 15 minutes. °Biking. °Hiking. °Golfing. °Dancing. °Water aerobics. °Vigorous-intensity exercise ideas include: °Walking 4.5 miles (7.2 km) or more in about 1 hour. °Jogging or running 5 miles (8 km) in about 1 hour. °Biking 10 miles (16.1 km) or more in about 1 hour. °Lap swimming. °Roller-skating or in-line skating. °Cross-country skiing. °Vigorous competitive sports, such as football, basketball, and soccer. °Jumping rope. °Aerobic dancing. °What are some everyday activities that can help me get exercise? °Yard work, such as: °Pushing a lawn mower. °Raking and bagging leaves. °Washing your car. °Pushing a stroller. °Shoveling snow. °Gardening. °Washing windows or floors. °How can I be more active in my day-to-day activities? °Use stairs instead of an elevator. °Take a walk during your lunch break. °If you drive, park your car farther away from your work or school. °If you take public transportation, get off one stop early and walk the rest of the way. °Stand up or walk around during all of your indoor phone calls. °Get up, stretch, and walk around every 30 minutes throughout the day. °Enjoy exercise with a friend. Support to continue exercising will help you keep a regular routine of activity. °Where to find more information °You can find more information about exercising to stay healthy from: °[place] Department of Health and Human Services: www.hhs.gov °Centers for Disease Control and Prevention (  CDC): www.cdc.gov °Summary °Exercising regularly is  important. It will improve your overall fitness, flexibility, and endurance. °Regular exercise will also improve your overall health. It can help you control your weight, reduce stress, and improve your bone density. °Do not exercise so much that you hurt yourself, feel dizzy, or get very short of breath. °Before you start a new exercise program, talk with your health care provider. °This information is not intended to replace advice given to you by your health care provider. Make sure you discuss any questions you have with your health care provider. °Document Revised: 12/05/2020 Document Reviewed: 12/05/2020 °[employer]vier Patient Education © 2022 [employer] ° °

## 2021-08-04 ENCOUNTER — Telehealth: Payer: Self-pay | Admitting: Nurse Practitioner

## 2021-08-19 ENCOUNTER — Telehealth: Payer: Self-pay | Admitting: Nurse Practitioner

## 2021-08-26 ENCOUNTER — Telehealth: Payer: Self-pay | Admitting: Nurse Practitioner

## 2021-08-26 NOTE — Telephone Encounter (Signed)
Called to schedule AWV

## 2021-09-28 ENCOUNTER — Ambulatory Visit: Payer: Medicare HMO

## 2021-09-28 DIAGNOSIS — L120 Bullous pemphigoid: Secondary | ICD-10-CM | POA: Diagnosis not present

## 2021-10-07 ENCOUNTER — Ambulatory Visit: Payer: Medicare HMO

## 2021-10-14 ENCOUNTER — Ambulatory Visit (INDEPENDENT_AMBULATORY_CARE_PROVIDER_SITE_OTHER): Payer: Medicare HMO

## 2021-10-14 VITALS — Ht 73.0 in | Wt 203.0 lb

## 2021-10-14 DIAGNOSIS — Z0000 Encounter for general adult medical examination without abnormal findings: Secondary | ICD-10-CM

## 2021-10-14 NOTE — Progress Notes (Signed)
Subjective:   Jonathon Adkins is a 71 y.o. male who presents for an Initial Medicare Annual Wellness Visit. Virtual Visit via Telephone Note  I connected with  Jonathon Adkins on 10/14/21 at  8:15 AM EST by telephone and verified that I am speaking with the correct person using two identifiers.  Location: Patient: HOME Provider: [facility] Persons participating in the virtual visit: patient/Nurse Health Advisor   I discussed the limitations, risks, security and privacy concerns of performing an evaluation and management service by telephone and the availability of in person appointments. The patient expressed understanding and agreed to proceed.  Interactive audio and video telecommunications were attempted between this nurse and patient, however failed, due to patient having technical difficulties OR patient did not have access to video capability.  We continued and completed visit with audio only.  Some vital signs may be absent or patient reported.   Chriss Driver, LPN  Review of Systems     Cardiac Risk Factors include: advanced age (>66men, >35 women);diabetes mellitus;hypertension;dyslipidemia;male gender;sedentary lifestyle     Objective:    Today's Vitals   10/14/21 0811  Weight: 203 lb (92.1 kg)  Height: 6\' 1"  (1.854 m)   Body mass index is 26.78 kg/m.  Advanced Directives 10/14/2021 04/08/2021 09/18/2014 06/12/2014 02/06/2014  Does Patient Have a Medical Advance Directive? No No No No Patient does not have advance directive;Patient would like information  Would patient like information on creating a medical advance directive? No - Patient declined - Yes - Scientist, clinical (histocompatibility and immunogenetics) given Yes - Scientist, clinical (histocompatibility and immunogenetics) given Advance directive packet given  Pre-existing out of facility DNR order (yellow form or pink MOST form) - - - - No    Current Medications (verified) Outpatient Encounter Medications as of 10/14/2021  Medication Sig   amLODipine (NORVASC) 10 MG tablet Take 1  tablet (10 mg total) by mouth daily.   atorvastatin (LIPITOR) 40 MG tablet Take 1 tablet (40 mg total) by mouth daily.   augmented betamethasone dipropionate (DIPROLENE-AF) 0.05 % ointment Apply 2 times daily to new blisters or itchy spots on scalp and body. Do not use on face, armpits or groin.   cholecalciferol (VITAMIN D) 1000 UNITS tablet Take 1 tablet (1,000 Units total) by mouth daily.   clobetasol ointment (TEMOVATE) 0.05 % Apply 2 times per day to blisters on scalp and body. Do not use on face.   finasteride (PROSCAR) 5 MG tablet Take 1 tablet (5 mg total) by mouth daily.   hydrochlorothiazide (HYDRODIURIL) 25 MG tablet Take 1 tablet (25 mg total) by mouth daily.   losartan (COZAAR) 50 MG tablet Take 1 tablet (50 mg total) by mouth daily.   metFORMIN (GLUCOPHAGE) 500 MG tablet Take 1 tablet (500 mg total) by mouth 2 (two) times daily with a meal.   [DISCONTINUED] hydrocortisone cream 1 % Apply 1 application topically 2 (two) times daily.   No facility-administered encounter medications on file as of 10/14/2021.    Allergies (verified) Codeine and Lisinopril   History: Past Medical History:  Diagnosis Date   Diabetes mellitus without complication ([place])    Enlarged prostate    Hyperlipidemia    Hypertension    Past Surgical History:  Procedure Laterality Date   COLONOSCOPY N/A 02/06/2014   Procedure: COLONOSCOPY;  Surgeon: Rogene Houston, MD;  [facility]: [facility];  Service: Endoscopy;  Laterality: N/A;  100   Family History  Problem Relation Age of Onset   Diabetes Mother  Coronary artery disease Father    Social History   Socioeconomic History   Marital status: Married    Spouse name: Estill Bamberg   Number of children: 1   Years of education: Not on file   Highest education level: Not on file  Occupational History   Not on file  Tobacco Use   Smoking status: Never   Smokeless tobacco: Never  Substance and Sexual Activity   Alcohol use: No   Drug use: No    Sexual activity: Not Currently  Other Topics Concern   Not on file  Social History Narrative   Married x 39 years 04/29/2022.   1 daughter, lives in [place].   Social Determinants of Health   Financial Resource Strain: Low Risk    Difficulty of Paying Living Expenses: Not hard at all  Food Insecurity: No Food Insecurity   Worried About Charity fundraiser in the Last Year: Never true   [place] in the Last Year: Never true  Transportation Needs: No Transportation Needs   Lack of Transportation (Medical): No   Lack of Transportation (Non-Medical): No  Physical Activity: Insufficiently Active   Days of Exercise per Week: 4 days   Minutes of Exercise per Session: 30 min  Stress: No Stress Concern Present   Feeling of Stress : Not at all  Social Connections: Socially Integrated   Frequency of Communication with Friends and Family: More than three times a week   Frequency of Social Gatherings with Friends and Family: More than three times a week   Attends Religious Services: More than 4 times per year   Active Member of [employer] or Organizations: Yes   Attends Music therapist: More than 4 times per year   Marital Status: Married    Tobacco Counseling Counseling given: Not Answered   Clinical Intake:  Pre-visit preparation completed: Yes  Pain : No/denies pain     BMI - recorded: 26.78 Nutritional Status: BMI 25 -29 Overweight Nutritional Risks: None Diabetes: Yes  How often do you need to have someone help you when you read instructions, pamphlets, or other written materials from your doctor or pharmacy?: 1 - Never  Diabetic?Nutrition Risk Assessment:  Has the patient had any N/V/D within the last 2 months?  No  Does the patient have any non-healing wounds?  No  Has the patient had any unintentional weight loss or weight gain?  No   Diabetes:  Is the patient diabetic?  Yes  If diabetic, was a CBG obtained today?  No  Did the patient bring in their  glucometer from home?  No  Phone visit. How often do you monitor your CBG's? Checks A1C at office.   Financial Strains and Diabetes Management:  Are you having any financial strains with the device, your supplies or your medication? No .  Does the patient want to be seen by Chronic Care Management for management of their diabetes?  No  Would the patient like to be referred to a Nutritionist or for Diabetic Management?  No   Diabetic Exams:  Diabetic Eye Exam: Completed 2022. Pt has been advised about the importance in completing this exam.  Diabetic Foot Exam: Completed 05/04/2021. Pt has been advised about the importance in completing this exam.   Interpreter Needed?: No  Information entered by :: mj Baltazar [place], lpn   Activities of Daily Living In your present state of health, do you have any difficulty performing the following activities: 10/14/2021  Hearing? Jonathon Adkins  Comment Only with wax buildup per pt.  Vision? N  Difficulty concentrating or making decisions? N  Walking or climbing stairs? N  Dressing or bathing? N  Doing errands, shopping? N  Preparing Food and eating ? N  Using the Toilet? N  In the past six months, have you accidently leaked urine? N  Do you have problems with loss of bowel control? N  Managing your Medications? N  Managing your Finances? N  Housekeeping or managing your Housekeeping? N  Some recent data might be hidden    Patient Care Team: Chevis Pretty, FNP as PCP - General (Nurse Practitioner)  Indicate any recent Medical Services you may have received from other than [facility] providers in the past year (date may be approximate).     Assessment:   This is a routine wellness examination for Jonathon Adkins.  Hearing/Vision screen Hearing Screening - Comments:: Some hearing issues.  Vision Screening - Comments:: Readers. Dr. Vara Guardian. 2022.  Dietary issues and exercise activities discussed: Current Exercise Habits: Home exercise routine, Type of  exercise: walking, Time (Minutes): 30, Frequency (Times/Week): 4, Weekly Exercise (Minutes/Week): 120, Intensity: Mild, Exercise limited by: cardiac condition(s)   Goals Addressed             This Visit's Progress    Exercise 3x per week (30 min per time)       Continue to increase exercise. Fish more.       Depression Screen PHQ 2/9 Scores 10/14/2021 08/03/2021 05/04/2021 10/31/2020 07/28/2020 04/24/2020 01/22/2020  PHQ - 2 Score 0 0 0 0 0 0 0  PHQ- 9 Score - 0 0 - - - -    Fall Risk Fall Risk  10/14/2021 08/03/2021 05/04/2021 10/31/2020 07/28/2020  Falls in the past year? 1 0 1 0 0  Number falls in past yr: 0 - 0 - -  Injury with Fall? 0 - 0 - -  Risk for fall due to : History of fall(s);Other (Comment) - History of fall(s) - -  Risk for fall due to: Comment Pt states he passed out and went to ER to be checked out, no injury. - - - -  Follow up Falls prevention discussed - Education provided - -    FALL RISK PREVENTION PERTAINING TO THE HOME:  Any stairs in or around the home? Yes  If so, are there any without handrails? No  Home free of loose throw rugs in walkways, pet beds, electrical cords, etc? Yes  Adequate lighting in your home to reduce risk of falls? Yes   ASSISTIVE DEVICES UTILIZED TO PREVENT FALLS:  Life alert? No  Use of a cane, walker or w/c? No  Grab bars in the bathroom? No  Shower chair or bench in shower? No  Elevated toilet seat or a handicapped toilet? Yes   TIMED UP AND GO:  Was the test performed? No .  Phone visit.   Cognitive Function:     6CIT Screen 10/14/2021  What Year? 0 points  What month? 0 points  What time? 0 points  Count back from 20 0 points  Months in reverse 0 points  Repeat phrase 2 points  Total Score 2    Immunizations Immunization History  Administered Date(s) Administered   Influenza,inj,Quad PF,6+ Mos 07/10/2015   Moderna Sars-Covid-2 Vaccination 01/26/2020, 02/23/2020, 09/22/2020   Pneumococcal Conjugate-13  10/13/2015   Pneumococcal Polysaccharide-23 03/02/2015    TDAP status: Due, Education has been provided regarding the importance of this vaccine. Advised may receive this vaccine  at local pharmacy or Health Dept. Aware to provide a copy of the vaccination record if obtained from local pharmacy or Health Dept. Verbalized acceptance and understanding.  Flu Vaccine status: Declined, Education has been provided regarding the importance of this vaccine but patient still declined. Advised may receive this vaccine at local pharmacy or Health Dept. Aware to provide a copy of the vaccination record if obtained from local pharmacy or Health Dept. Verbalized acceptance and understanding.  Pneumococcal vaccine status: Up to date  Covid-19 vaccine status: Completed vaccines  Qualifies for Shingles Vaccine? Yes   Zostavax completed No   Shingrix Completed?: No.    Education has been provided regarding the importance of this vaccine. Patient has been advised to call insurance company to determine out of pocket expense if they have not yet received this vaccine. Advised may also receive vaccine at local pharmacy or Health Dept. Verbalized acceptance and understanding.  Screening Tests Health Maintenance  Topic Date Due   Zoster Vaccines- Shingrix (1 of 2) Never done   COVID-19 Vaccine (4 - Booster for Moderna series) 11/17/2020   INFLUENZA VACCINE  11/20/2021 (Originally 03/23/2021)   TETANUS/TDAP  05/04/2022 (Originally 01/22/2020)   HEMOGLOBIN A1C  02/01/2022   FOOT EXAM  05/04/2022   OPHTHALMOLOGY EXAM  06/16/2022   COLONOSCOPY (Pts 45-32yrs Insurance coverage will need to be confirmed)  02/07/2024   Pneumonia Vaccine 41+ Years old  Completed   Hepatitis C Screening  Completed   HPV VACCINES  Aged Out    Health Maintenance  Health Maintenance Due  Topic Date Due   Zoster Vaccines- Shingrix (1 of 2) Never done   COVID-19 Vaccine (4 - Booster for Moderna series) 11/17/2020    Colorectal cancer  screening: Type of screening: Colonoscopy. Completed 01/09/2014. Repeat every 10 years  Lung Cancer Screening: (Low Dose CT Chest recommended if Age 65-80 years, 30 pack-year currently smoking OR have quit w/in 15years.) does not qualify.    Additional Screening:  Hepatitis C Screening: does qualify; Completed 07/10/2015  Vision Screening: Recommended annual ophthalmology exams for early detection of glaucoma and other disorders of the eye. Is the patient up to date with their annual eye exam?  Yes  Who is the provider or what is the name of the office in which the patient attends annual eye exams? Dr. Vara Guardian If pt is not established with a provider, would they like to be referred to a provider to establish care? No .   Dental Screening: Recommended annual dental exams for proper oral hygiene  Community Resource Referral / Chronic Care Management: CRR required this visit?  No   CCM required this visit?  No      Plan:     I have personally reviewed and noted the following in the patients chart:   Medical and social history Use of alcohol, tobacco or illicit drugs  Current medications and supplements including opioid prescriptions. Patient is currently taking opioid prescriptions. Information provided to patient regarding non-opioid alternatives. Patient advised to discuss non-opioid treatment plan with their provider. Functional ability and status Nutritional status Physical activity Advanced directives List of other physicians Hospitalizations, surgeries, and ER visits in previous 12 months Vitals Screenings to include cognitive, depression, and falls Referrals and appointments  In addition, I have reviewed and discussed with patient certain preventive protocols, quality metrics, and best practice recommendations. A written personalized care plan for preventive services as well as general preventive health recommendations were provided to patient.     Karle Starch  Cedric Fishman, LPN   11/09/377   Nurse Notes: Pt is up to date on health maintenance. Discussed Shingrix vaccine and how to obtain. Declines flu vaccine.

## 2021-10-14 NOTE — Patient Instructions (Signed)
Jonathon Adkins , Thank you for taking time to come for your Medicare Wellness Visit. I appreciate your ongoing commitment to your health goals. Please review the following plan we discussed and let me know if I can assist you in the future.   Screening recommendations/referrals: Colonoscopy: Done 01/09/2014 Repeat in 10 years  Recommended yearly ophthalmology/optometry visit for glaucoma screening and checkup Recommended yearly dental visit for hygiene and checkup  Vaccinations: Influenza vaccine: Declined. Pneumococcal vaccine: Done 10/13/2015 and 03/02/2015 Tdap vaccine: Due. Repeat in 10 years  Shingles vaccine: Discussed. Obtain vaccine at local pharmacy.   Covid-19: Done 01/26/2020, 02/23/2020 and 09/22/2020.  Advanced directives: Advance directive discussed with you today. Even though you declined this today, please call our office should you change your mind, and we can give you the proper paperwork for you to fill out.   Conditions/risks identified: Aim for 30 minutes of exercise or brisk walking each day, drink 6-8 glasses of water and eat lots of fruits and vegetables.   Next appointment: Follow up in one year for your annual wellness visit. 2024  Preventive Care 65 Years and Older, Male  Preventive care refers to lifestyle choices and visits with your health care provider that can promote health and wellness. What does preventive care include? A yearly physical exam. This is also called an annual well check. Dental exams once or twice a year. Routine eye exams. Ask your health care provider how often you should have your eyes checked. Personal lifestyle choices, including: Daily care of your teeth and gums. Regular physical activity. Eating a healthy diet. Avoiding tobacco and drug use. Limiting alcohol use. Practicing safe sex. Taking low doses of aspirin every day. Taking vitamin and mineral supplements as recommended by your health care provider. What happens during an annual  well check? The services and screenings done by your health care provider during your annual well check will depend on your age, overall health, lifestyle risk factors, and family history of disease. Counseling  Your health care provider may ask you questions about your: Alcohol use. Tobacco use. Drug use. Emotional well-being. Home and relationship well-being. Sexual activity. Eating habits. History of falls. Memory and ability to understand (cognition). Work and work Statistician. Screening  You may have the following tests or measurements: Height, weight, and BMI. Blood pressure. Lipid and cholesterol levels. These may be checked every 5 years, or more frequently if you are over 27 years old. Skin check. Lung cancer screening. You may have this screening every year starting at age 66 if you have a 30-pack-year history of smoking and currently smoke or have quit within the past 15 years. Fecal occult blood test (FOBT) of the stool. You may have this test every year starting at age 64. Flexible sigmoidoscopy or colonoscopy. You may have a sigmoidoscopy every 5 years or a colonoscopy every 10 years starting at age 28. Prostate cancer screening. Recommendations will vary depending on your family history and other risks. Hepatitis C blood test. Hepatitis B blood test. Sexually transmitted disease (STD) testing. Diabetes screening. This is done by checking your blood sugar (glucose) after you have not eaten for a while (fasting). You may have this done every 1-3 years. Abdominal aortic aneurysm (AAA) screening. You may need this if you are a current or former smoker. Osteoporosis. You may be screened starting at age 59 if you are at high risk. Talk with your health care provider about your test results, treatment options, and if necessary, the need for more  tests. Vaccines  Your health care provider may recommend certain vaccines, such as: Influenza vaccine. This is recommended every  year. Tetanus, diphtheria, and acellular pertussis (Tdap, Td) vaccine. You may need a Td booster every 10 years. Zoster vaccine. You may need this after age 45. Pneumococcal 13-valent conjugate (PCV13) vaccine. One dose is recommended after age 67. Pneumococcal polysaccharide (PPSV23) vaccine. One dose is recommended after age 9. Talk to your health care provider about which screenings and vaccines you need and how often you need them. This information is not intended to replace advice given to you by your health care provider. Make sure you discuss any questions you have with your health care provider. Document Released: 09/05/2015 Document Revised: 04/28/2016 Document Reviewed: 06/10/2015 Elsevier Interactive Patient Education  2017 [employer] Prevention in the Home Falls can cause injuries. They can happen to people of all ages. There are many things you can do to make your home safe and to help prevent falls. What can I do on the outside of my home? Regularly fix the edges of walkways and driveways and fix any cracks. Remove anything that might make you trip as you walk through a door, such as a raised step or threshold. Trim any bushes or trees on the path to your home. Use bright outdoor lighting. Clear any walking paths of anything that might make someone trip, such as rocks or tools. Regularly check to see if handrails are loose or broken. Make sure that both sides of any steps have handrails. Any raised decks and porches should have guardrails on the edges. Have any leaves, snow, or ice cleared regularly. Use sand or salt on walking paths during winter. Clean up any spills in your garage right away. This includes oil or grease spills. What can I do in the bathroom? Use night lights. Install grab bars by the toilet and in the tub and shower. Do not use towel bars as grab bars. Use non-skid mats or decals in the tub or shower. If you need to sit down in the shower, use a  plastic, non-slip stool. Keep the floor dry. Clean up any water that spills on the floor as soon as it happens. Remove soap buildup in the tub or shower regularly. Attach bath mats securely with double-sided non-slip rug tape. Do not have throw rugs and other things on the floor that can make you trip. What can I do in the bedroom? Use night lights. Make sure that you have a light by your bed that is easy to reach. Do not use any sheets or blankets that are too big for your bed. They should not hang down onto the floor. Have a firm chair that has side arms. You can use this for support while you get dressed. Do not have throw rugs and other things on the floor that can make you trip. What can I do in the kitchen? Clean up any spills right away. Avoid walking on wet floors. Keep items that you use a lot in easy-to-reach places. If you need to reach something above you, use a strong step stool that has a grab bar. Keep electrical cords out of the way. Do not use floor polish or wax that makes floors slippery. If you must use wax, use non-skid floor wax. Do not have throw rugs and other things on the floor that can make you trip. What can I do with my stairs? Do not leave any items on the stairs. Make sure  that there are handrails on both sides of the stairs and use them. Fix handrails that are broken or loose. Make sure that handrails are as long as the stairways. Check any carpeting to make sure that it is firmly attached to the stairs. Fix any carpet that is loose or worn. Avoid having throw rugs at the top or bottom of the stairs. If you do have throw rugs, attach them to the floor with carpet tape. Make sure that you have a light switch at the top of the stairs and the bottom of the stairs. If you do not have them, ask someone to add them for you. What else can I do to help prevent falls? Wear shoes that: Do not have high heels. Have rubber bottoms. Are comfortable and fit you  well. Are closed at the toe. Do not wear sandals. If you use a stepladder: Make sure that it is fully opened. Do not climb a closed stepladder. Make sure that both sides of the stepladder are locked into place. Ask someone to hold it for you, if possible. Clearly mark and make sure that you can see: Any grab bars or handrails. First and last steps. Where the edge of each step is. Use tools that help you move around (mobility aids) if they are needed. These include: Canes. Walkers. Scooters. Crutches. Turn on the lights when you go into a dark area. Replace any light bulbs as soon as they burn out. Set up your furniture so you have a clear path. Avoid moving your furniture around. If any of your floors are uneven, fix them. If there are any pets around you, be aware of where they are. Review your medicines with your doctor. Some medicines can make you feel dizzy. This can increase your chance of falling. Ask your doctor what other things that you can do to help prevent falls. This information is not intended to replace advice given to you by your health care provider. Make sure you discuss any questions you have with your health care provider. Document Released: 06/05/2009 Document Revised: 01/15/2016 Document Reviewed: 09/13/2014 Elsevier Interactive Patient Education  2017 [employer].

## 2021-12-07 DIAGNOSIS — R972 Elevated prostate specific antigen [PSA]: Secondary | ICD-10-CM | POA: Diagnosis not present

## 2021-12-14 DIAGNOSIS — N400 Enlarged prostate without lower urinary tract symptoms: Secondary | ICD-10-CM | POA: Diagnosis not present

## 2021-12-14 DIAGNOSIS — R972 Elevated prostate specific antigen [PSA]: Secondary | ICD-10-CM | POA: Diagnosis not present

## 2022-01-29 ENCOUNTER — Other Ambulatory Visit: Payer: Self-pay | Admitting: Nurse Practitioner

## 2022-01-29 DIAGNOSIS — E119 Type 2 diabetes mellitus without complications: Secondary | ICD-10-CM

## 2022-01-29 DIAGNOSIS — I10 Essential (primary) hypertension: Secondary | ICD-10-CM

## 2022-01-29 DIAGNOSIS — E782 Mixed hyperlipidemia: Secondary | ICD-10-CM

## 2022-02-01 ENCOUNTER — Encounter: Payer: Self-pay | Admitting: Nurse Practitioner

## 2022-02-01 ENCOUNTER — Ambulatory Visit (INDEPENDENT_AMBULATORY_CARE_PROVIDER_SITE_OTHER): Payer: Medicare HMO | Admitting: Nurse Practitioner

## 2022-02-01 VITALS — BP 130/76 | HR 81 | Temp 98.9°F | Resp 20 | Ht 73.0 in | Wt 211.0 lb

## 2022-02-01 DIAGNOSIS — E782 Mixed hyperlipidemia: Secondary | ICD-10-CM

## 2022-02-01 DIAGNOSIS — N400 Enlarged prostate without lower urinary tract symptoms: Secondary | ICD-10-CM | POA: Diagnosis not present

## 2022-02-01 DIAGNOSIS — Z6832 Body mass index (BMI) 32.0-32.9, adult: Secondary | ICD-10-CM

## 2022-02-01 DIAGNOSIS — I10 Essential (primary) hypertension: Secondary | ICD-10-CM | POA: Diagnosis not present

## 2022-02-01 DIAGNOSIS — E119 Type 2 diabetes mellitus without complications: Secondary | ICD-10-CM | POA: Diagnosis not present

## 2022-02-01 LAB — BAYER DCA HB A1C WAIVED: HB A1C (BAYER DCA - WAIVED): 6.8 % — ABNORMAL HIGH (ref 4.8–5.6)

## 2022-02-01 MED ORDER — ATORVASTATIN CALCIUM 40 MG PO TABS: 40.0000 mg | ORAL_TABLET | Freq: Every day | ORAL | 1 refills | Status: DC

## 2022-02-01 MED ORDER — HYDROCHLOROTHIAZIDE 25 MG PO TABS: 25.0000 mg | ORAL_TABLET | Freq: Every day | ORAL | 1 refills | Status: DC

## 2022-02-01 MED ORDER — AMLODIPINE BESYLATE 10 MG PO TABS: 10.0000 mg | ORAL_TABLET | Freq: Every day | ORAL | 1 refills | Status: DC

## 2022-02-01 MED ORDER — FINASTERIDE 5 MG PO TABS: 5.0000 mg | ORAL_TABLET | Freq: Every day | ORAL | 1 refills | Status: DC

## 2022-02-01 MED ORDER — LOSARTAN POTASSIUM 50 MG PO TABS: 50.0000 mg | ORAL_TABLET | Freq: Every day | ORAL | 1 refills | Status: DC

## 2022-02-01 MED ORDER — METFORMIN HCL 500 MG PO TABS: 500.0000 mg | ORAL_TABLET | Freq: Two times a day (BID) | ORAL | 1 refills | Status: DC

## 2022-02-01 NOTE — Patient Instructions (Signed)
Diabetes Mellitus and Foot Care Foot care is an important part of your health, especially when you have diabetes. Diabetes may cause you to have problems because of poor blood flow (circulation) to your feet and legs, which can cause your skin to: Become thinner and drier. Break more easily. Heal more slowly. Peel and crack. You may also have nerve damage (neuropathy) in your legs and feet, causing decreased feeling in them. This means that you may not notice minor injuries to your feet that could lead to more serious problems. Noticing and addressing any potential problems early is the best way to prevent future foot problems. How to care for your feet Foot hygiene  Wash your feet daily with warm water and mild soap. Do not use hot water. Then, pat your feet and the areas between your toes until they are completely dry. Do not soak your feet as this can dry your skin. Trim your toenails straight across. Do not dig under them or around the cuticle. File the edges of your nails with an emery board or nail file. Apply a moisturizing lotion or petroleum jelly to the skin on your feet and to dry, brittle toenails. Use lotion that does not contain alcohol and is unscented. Do not apply lotion between your toes. Shoes and socks Wear clean socks or stockings every day. Make sure they are not too tight. Do not wear knee-high stockings since they may decrease blood flow to your legs. Wear shoes that fit properly and have enough cushioning. Always look in your shoes before you put them on to be sure there are no objects inside. To break in new shoes, wear them for just a few hours a day. This prevents injuries on your feet. Wounds, scrapes, corns, and calluses  Check your feet daily for blisters, cuts, bruises, sores, and redness. If you cannot see the bottom of your feet, use a mirror or ask someone for help. Do not cut corns or calluses or try to remove them with medicine. If you find a minor scrape,  cut, or break in the skin on your feet, keep it and the skin around it clean and dry. You may clean these areas with mild soap and water. Do not clean the area with peroxide, alcohol, or iodine. If you have a wound, scrape, corn, or callus on your foot, look at it several times a day to make sure it is healing and not infected. Check for: Redness, swelling, or pain. Fluid or blood. Warmth. Pus or a bad smell. General tips Do not cross your legs. This may decrease blood flow to your feet. Do not use heating pads or hot water bottles on your feet. They may burn your skin. If you have lost feeling in your feet or legs, you may not know this is happening until it is too late. Protect your feet from hot and cold by wearing shoes, such as at the beach or on hot pavement. Schedule a complete foot exam at least once a year (annually) or more often if you have foot problems. Report any cuts, sores, or bruises to your health care provider immediately. Where to find more information American Diabetes Association: www.diabetes.org Association of Diabetes [employer] Education Specialists: www.diabeteseducator.org Contact a health care provider if: You have a medical condition that increases your risk of infection and you have any cuts, sores, or bruises on your feet. You have an injury that is not healing. You have redness on your legs or feet. You   feel burning or tingling in your legs or feet. You have pain or cramps in your legs and feet. Your legs or feet are numb. Your feet always feel cold. You have pain around any toenails. Get help right away if: You have a wound, scrape, corn, or callus on your foot and: You have pain, swelling, or redness that gets worse. You have fluid or blood coming from the wound, scrape, corn, or callus. Your wound, scrape, corn, or callus feels warm to the touch. You have pus or a bad smell coming from the wound, scrape, corn, or callus. You have a fever. You have a red  line going up your leg. Summary Check your feet every day for blisters, cuts, bruises, sores, and redness. Apply a moisturizing lotion or petroleum jelly to the skin on your feet and to dry, brittle toenails. Wear shoes that fit properly and have enough cushioning. If you have foot problems, report any cuts, sores, or bruises to your health care provider immediately. Schedule a complete foot exam at least once a year (annually) or more often if you have foot problems. This information is not intended to replace advice given to you by your health care provider. Make sure you discuss any questions you have with your health care provider. Document Revised: 02/28/2020 Document Reviewed: 02/28/2020 Elsevier Patient Education  2023 [employer] Inc.  

## 2022-02-01 NOTE — Progress Notes (Signed)
Subjective:    Patient ID: Jonathon Adkins, male    DOB: Mar 04, 1951, 71 y.o.   MRN: [id number]  Chief Complaint: No chief complaint on file.    HPI:  Jonathon Adkins is a 71 y.o. who identifies as a male who was assigned male at birth.   Social history: Lives with: wife Work history: retired   Scientist, forensic in today for follow up of the following chronic medical issues:  1. Primary hypertension No c/o chest pain, sob or headache. Does not check blood pressure at home. BP Readings from Last 3 Encounters:  08/03/21 138/87  05/04/21 (!) 145/84  04/08/21 (!) 147/79     2. Type 2 diabetes mellitus without complication, without long-term current use of insulin (HCC) Fasting blood sugars are running around 120-140. He denies any low blood sugars. Lab Results  Component Value Date   HGBA1C 6.6 (H) 08/03/2021     3. Mixed hyperlipidemia Does try to watch diet. Does no dedicated exercise. Lab Results  Component Value Date   CHOL 153 08/03/2021   HDL 48 08/03/2021   LDLCALC 90 08/03/2021   TRIG 77 08/03/2021   CHOLHDL 3.2 08/03/2021  The 10-year ASCVD risk score (Arnett DK, et al., 2019) is: 33.9%    4. Benign prostatic hyperplasia without lower urinary tract symptoms Denies any voiding issues. Saw urology in March and he is to follow up in 1 year. Lab Results  Component Value Date   PSA1 4.2 (H) 10/31/2020   PSA1 3.0 10/15/2019   PSA1 3.0 11/01/2016      5. BMI 32.0-32.9,adult No recent weight changes   New complaints: None today  Allergies  Allergen Reactions   Codeine    Lisinopril    Outpatient Encounter Medications as of 02/01/2022  Medication Sig   amLODipine (NORVASC) 10 MG tablet Take 1 tablet (10 mg total) by mouth daily.   atorvastatin (LIPITOR) 40 MG tablet TAKE 1 TABLET BY MOUTH EVERY DAY   augmented betamethasone dipropionate (DIPROLENE-AF) 0.05 % ointment Apply 2 times daily to new blisters or itchy spots on scalp and body. Do not use on face, armpits  or groin.   cholecalciferol (VITAMIN D) 1000 UNITS tablet Take 1 tablet (1,000 Units total) by mouth daily.   clobetasol ointment (TEMOVATE) 0.05 % Apply 2 times per day to blisters on scalp and body. Do not use on face.   finasteride (PROSCAR) 5 MG tablet Take 1 tablet (5 mg total) by mouth daily.   hydrochlorothiazide (HYDRODIURIL) 25 MG tablet Take 1 tablet (25 mg total) by mouth daily.   losartan (COZAAR) 50 MG tablet TAKE 1 TABLET BY MOUTH EVERY DAY   metFORMIN (GLUCOPHAGE) 500 MG tablet TAKE 1 TABLET BY MOUTH 2 TIMES DAILY WITH A MEAL.   No facility-administered encounter medications on file as of 02/01/2022.    Past Surgical History:  Procedure Laterality Date   COLONOSCOPY N/A 02/06/2014   Procedure: COLONOSCOPY;  Surgeon: Rogene Houston, MD;  Location: [facility];  Service: Endoscopy;  Laterality: N/A;  100    Family History  Problem Relation Age of Onset   Diabetes Mother    Coronary artery disease Father       Controlled substance contract: n/a     Review of Systems  Constitutional:  Negative for diaphoresis.  Eyes:  Negative for pain.  Respiratory:  Negative for shortness of breath.   Cardiovascular:  Negative for chest pain, palpitations and leg swelling.  Gastrointestinal:  Negative for abdominal  pain.  Endocrine: Negative for polydipsia.  Skin:  Negative for rash.  Neurological:  Negative for dizziness, weakness and headaches.  Hematological:  Does not bruise/bleed easily.  All other systems reviewed and are negative.      Objective:   Physical Exam Vitals and nursing note reviewed.  Constitutional:      Appearance: Normal appearance. He is well-developed.  HENT:     Head: Normocephalic.     Nose: Nose normal.     Mouth/Throat:     Mouth: Mucous membranes are moist.     Pharynx: Oropharynx is clear.  Eyes:     Pupils: Pupils are equal, round, and reactive to light.  Neck:     Thyroid: No thyroid mass or thyromegaly.     Vascular: No carotid  bruit or JVD.     Trachea: Phonation normal.  Cardiovascular:     Rate and Rhythm: Normal rate and regular rhythm.  Pulmonary:     Effort: Pulmonary effort is normal. No respiratory distress.     Breath sounds: Normal breath sounds.  Abdominal:     General: Bowel sounds are normal.     Palpations: Abdomen is soft.     Tenderness: There is no abdominal tenderness.  Musculoskeletal:        General: Normal range of motion.     Cervical back: Normal range of motion and neck supple.  Lymphadenopathy:     Cervical: No cervical adenopathy.  Skin:    General: Skin is warm and dry.  Neurological:     Mental Status: He is alert and oriented to person, place, and time.  Psychiatric:        Behavior: Behavior normal.        Thought Content: Thought content normal.        Judgment: Judgment normal.    BP 130/76   Pulse 81   Temp 98.9 F (37.2 C) (Temporal)   Resp 20   Ht _0  (1.854 m)   Wt 211 lb (95.7 kg)   SpO2 100%   BMI 27.84 kg/m    Hgba1c 6.8%     Assessment & Plan:   Jonathon Adkins comes in today with chief complaint of Medical Management of Chronic Issues   Diagnosis and orders addressed:  1. Primary hypertension Low sodium diet - CBC with Differential/Platelet - CMP14+EGFR - losartan (COZAAR) 50 MG tablet; Take 1 tablet (50 mg total) by mouth daily.  Dispense: 90 tablet; Refill: 1 - hydrochlorothiazide (HYDRODIURIL) 25 MG tablet; Take 1 tablet (25 mg total) by mouth daily.  Dispense: 90 tablet; Refill: 1 - amLODipine (NORVASC) 10 MG tablet; Take 1 tablet (10 mg total) by mouth daily.  Dispense: 90 tablet; Refill: 1  2. Type 2 diabetes mellitus without complication, without long-term current use of insulin (HCC) Continue to watch carbs in diet - Bayer DCA Hb A1c Waived - metFORMIN (GLUCOPHAGE) 500 MG tablet; Take 1 tablet (500 mg total) by mouth 2 (two) times daily with a meal.  Dispense: 180 tablet; Refill: 1  3. Mixed hyperlipidemia Low fat diet - Lipid  panel - atorvastatin (LIPITOR) 40 MG tablet; Take 1 tablet (40 mg total) by mouth daily.  Dispense: 90 tablet; Refill: 1  4. Benign prostatic hyperplasia without lower urinary tract symptoms Keep follow up with urology - finasteride (PROSCAR) 5 MG tablet; Take 1 tablet (5 mg total) by mouth daily.  Dispense: 90 tablet; Refill: 1  5. BMI 32.0-32.9,adult Discussed diet and exercise for person with  BMI >25 Will recheck weight in 3-6 months    Labs pending Health Maintenance reviewed Diet and exercise encouraged  Follow up plan: 6 months   [name] Done, FNP

## 2022-02-02 LAB — CMP14+EGFR
ALT: 13 IU/L (ref 0–44)
AST: 13 IU/L (ref 0–40)
Albumin/Globulin Ratio: 1.4 (ref 1.2–2.2)
Albumin: 4.2 g/dL (ref 3.7–4.7)
Alkaline Phosphatase: 107 IU/L (ref 44–121)
BUN/Creatinine Ratio: 15 (ref 10–24)
BUN: 22 mg/dL (ref 8–27)
Bilirubin Total: 0.4 mg/dL (ref 0.0–1.2)
CO2: 23 mmol/L (ref 20–29)
Calcium: 9.8 mg/dL (ref 8.6–10.2)
Chloride: 105 mmol/L (ref 96–106)
Creatinine, Ser: 1.48 mg/dL — ABNORMAL HIGH (ref 0.76–1.27)
Globulin, Total: 3 g/dL (ref 1.5–4.5)
Glucose: 144 mg/dL — ABNORMAL HIGH (ref 70–99)
Potassium: 5 mmol/L (ref 3.5–5.2)
Sodium: 142 mmol/L (ref 134–144)
Total Protein: 7.2 g/dL (ref 6.0–8.5)
eGFR: 50 mL/min/{1.73_m2} — ABNORMAL LOW (ref >59)

## 2022-02-02 LAB — CBC WITH DIFFERENTIAL/PLATELET
Basophils Absolute: 0.1 10*3/uL (ref 0.0–0.2)
Basos: 1 %
EOS (ABSOLUTE): 0.2 10*3/uL (ref 0.0–0.4)
Eos: 3 %
Hematocrit: 38.5 % (ref 37.5–51.0)
Hemoglobin: 12.8 g/dL — ABNORMAL LOW (ref 13.0–17.7)
Immature Grans (Abs): 0 10*3/uL (ref 0.0–0.1)
Immature Granulocytes: 0 %
Lymphocytes Absolute: 1.4 10*3/uL (ref 0.7–3.1)
Lymphs: 24 %
MCH: 28.2 pg (ref 26.6–33.0)
MCHC: 33.2 g/dL (ref 31.5–35.7)
MCV: 85 fL (ref 79–97)
Monocytes Absolute: 0.5 10*3/uL (ref 0.1–0.9)
Monocytes: 9 %
Neutrophils Absolute: 3.9 10*3/uL (ref 1.4–7.0)
Neutrophils: 63 %
Platelets: 314 10*3/uL (ref 150–450)
RBC: 4.54 x10E6/uL (ref 4.14–5.80)
RDW: 13.7 % (ref 11.6–15.4)
WBC: 6.1 10*3/uL (ref 3.4–10.8)

## 2022-02-02 LAB — LIPID PANEL
Chol/HDL Ratio: 3.1 ratio (ref 0.0–5.0)
Cholesterol, Total: 134 mg/dL (ref 100–199)
HDL: 43 mg/dL (ref >39)
LDL Chol Calc (NIH): 78 mg/dL (ref 0–99)
Triglycerides: 61 mg/dL (ref 0–149)
VLDL Cholesterol Cal: 13 mg/dL (ref 5–40)

## 2022-07-31 ENCOUNTER — Other Ambulatory Visit: Payer: Self-pay | Admitting: Nurse Practitioner

## 2022-07-31 DIAGNOSIS — I10 Essential (primary) hypertension: Secondary | ICD-10-CM

## 2022-08-03 ENCOUNTER — Encounter: Payer: Self-pay | Admitting: Nurse Practitioner

## 2022-08-03 ENCOUNTER — Ambulatory Visit (INDEPENDENT_AMBULATORY_CARE_PROVIDER_SITE_OTHER): Payer: Medicare HMO | Admitting: Nurse Practitioner

## 2022-08-03 VITALS — BP 140/78 | HR 88 | Temp 97.7°F | Resp 20 | Ht 73.0 in | Wt 205.0 lb

## 2022-08-03 DIAGNOSIS — E782 Mixed hyperlipidemia: Secondary | ICD-10-CM | POA: Diagnosis not present

## 2022-08-03 DIAGNOSIS — I10 Essential (primary) hypertension: Secondary | ICD-10-CM

## 2022-08-03 DIAGNOSIS — Z6832 Body mass index (BMI) 32.0-32.9, adult: Secondary | ICD-10-CM

## 2022-08-03 DIAGNOSIS — E119 Type 2 diabetes mellitus without complications: Secondary | ICD-10-CM

## 2022-08-03 DIAGNOSIS — N400 Enlarged prostate without lower urinary tract symptoms: Secondary | ICD-10-CM

## 2022-08-03 LAB — BAYER DCA HB A1C WAIVED: HB A1C (BAYER DCA - WAIVED): 8 % — ABNORMAL HIGH (ref 4.8–5.6)

## 2022-08-03 MED ORDER — AMLODIPINE BESYLATE 10 MG PO TABS: 10.0000 mg | ORAL_TABLET | Freq: Every day | ORAL | 1 refills | Status: DC

## 2022-08-03 MED ORDER — LOSARTAN POTASSIUM 50 MG PO TABS: 50.0000 mg | ORAL_TABLET | Freq: Every day | ORAL | 1 refills | Status: DC

## 2022-08-03 MED ORDER — HYDROCHLOROTHIAZIDE 25 MG PO TABS: 25.0000 mg | ORAL_TABLET | Freq: Every day | ORAL | 1 refills | Status: DC

## 2022-08-03 MED ORDER — ATORVASTATIN CALCIUM 40 MG PO TABS: 40.0000 mg | ORAL_TABLET | Freq: Every day | ORAL | 1 refills | Status: DC

## 2022-08-03 MED ORDER — METFORMIN HCL 500 MG PO TABS: 500.0000 mg | ORAL_TABLET | Freq: Two times a day (BID) | ORAL | 1 refills | Status: DC

## 2022-08-03 MED ORDER — FINASTERIDE 5 MG PO TABS: 5.0000 mg | ORAL_TABLET | Freq: Every day | ORAL | 1 refills | Status: DC

## 2022-08-03 NOTE — Progress Notes (Signed)
Subjective:    Patient ID: Jonathon Adkins, male    DOB: 03-06-1951, 71 y.o.   MRN: [id number]   Chief Complaint: medical management of chronic issues     HPI:  Jonathon Adkins is a 71 y.o. who identifies as a male who was assigned male at birth.   Social history: Lives with: wife Work history: rtired   Comes in today for follow up of the following chronic medical issues:  1. Primary hypertension No c/o chest pain, sob or headache. Does not check blood pressure at home. BP Readings from Last 3 Encounters:  02/01/22 130/76  08/03/21 138/87  05/04/21 (!) 145/84     2. Mixed hyperlipidemia Does try to watch diet and stays very active. Lab Results  Component Value Date   CHOL 134 02/01/2022   HDL 43 02/01/2022   LDLCALC 78 02/01/2022   TRIG 61 02/01/2022   CHOLHDL 3.1 02/01/2022     3. Type 2 diabetes mellitus without complication, without long-term current use of insulin (HCC) Fasting blood sugars are running aorund 120-150. Denies any low blood sugars. Lab Results  Component Value Date   HGBA1C 6.8 (H) 02/01/2022      4. Benign prostatic hyperplasia without lower urinary tract symptoms Is on proscar daily. Denies any voiding  issues  5. BMI 32.0-32.9,adult No recent weight changes Wt Readings from Last 3 Encounters:  08/03/22 205 lb (93 kg)  02/01/22 211 lb (95.7 kg)  10/14/21 203 lb (92.1 kg)   BMI Readings from Last 3 Encounters:  08/03/22 27.05 kg/m  02/01/22 27.84 kg/m  10/14/21 26.78 kg/m     New complaints: None today  Allergies  Allergen Reactions   Codeine    Lisinopril    Outpatient Encounter Medications as of 08/03/2022  Medication Sig   amLODipine (NORVASC) 10 MG tablet TAKE 1 TABLET BY MOUTH EVERY DAY   atorvastatin (LIPITOR) 40 MG tablet Take 1 tablet (40 mg total) by mouth daily.   augmented betamethasone dipropionate (DIPROLENE-AF) 0.05 % ointment Apply 2 times daily to new blisters or itchy spots on scalp and body. Do not use  on face, armpits or groin.   cholecalciferol (VITAMIN D) 1000 UNITS tablet Take 1 tablet (1,000 Units total) by mouth daily.   clobetasol ointment (TEMOVATE) 0.05 % Apply 2 times per day to blisters on scalp and body. Do not use on face.   finasteride (PROSCAR) 5 MG tablet Take 1 tablet (5 mg total) by mouth daily.   hydrochlorothiazide (HYDRODIURIL) 25 MG tablet Take 1 tablet (25 mg total) by mouth daily.   losartan (COZAAR) 50 MG tablet Take 1 tablet (50 mg total) by mouth daily.   metFORMIN (GLUCOPHAGE) 500 MG tablet Take 1 tablet (500 mg total) by mouth 2 (two) times daily with a meal.   No facility-administered encounter medications on file as of 08/03/2022.    Past Surgical History:  Procedure Laterality Date   COLONOSCOPY N/A 02/06/2014   Procedure: COLONOSCOPY;  Surgeon: Rogene Houston, MD;  Location: [facility];  Service: Endoscopy;  Laterality: N/A;  100    Family History  Problem Relation Age of Onset   Diabetes Mother    Coronary artery disease Father       Controlled substance contract: n/a     Review of Systems  Constitutional:  Negative for diaphoresis.  Eyes:  Negative for pain.  Respiratory:  Negative for shortness of breath.   Cardiovascular:  Negative for chest pain, palpitations and leg  swelling.  Gastrointestinal:  Negative for abdominal pain.  Endocrine: Negative for polydipsia.  Skin:  Negative for rash.  Neurological:  Negative for dizziness, weakness and headaches.  Hematological:  Does not bruise/bleed easily.  All other systems reviewed and are negative.      Objective:   Physical Exam Vitals and nursing note reviewed.  Constitutional:      Appearance: Normal appearance. He is well-developed.  HENT:     Head: Normocephalic.     Nose: Nose normal.     Mouth/Throat:     Mouth: Mucous membranes are moist.     Pharynx: Oropharynx is clear.  Eyes:     Pupils: Pupils are equal, round, and reactive to light.  Neck:     Thyroid: No  thyroid mass or thyromegaly.     Vascular: No carotid bruit or JVD.     Trachea: Phonation normal.  Cardiovascular:     Rate and Rhythm: Normal rate and regular rhythm.  Pulmonary:     Effort: Pulmonary effort is normal. No respiratory distress.     Breath sounds: Normal breath sounds.  Abdominal:     General: Bowel sounds are normal.     Palpations: Abdomen is soft.     Tenderness: There is no abdominal tenderness.  Musculoskeletal:        General: Normal range of motion.     Cervical back: Normal range of motion and neck supple.  Lymphadenopathy:     Cervical: No cervical adenopathy.  Skin:    General: Skin is warm and dry.  Neurological:     Mental Status: He is alert and oriented to person, place, and time.  Psychiatric:        Behavior: Behavior normal.        Thought Content: Thought content normal.        Judgment: Judgment normal.     BP (!) 140/78   Pulse 88   Temp 97.7 F (36.5 C) (Temporal)   Resp 20   Ht _0  (1.854 m)   Wt 205 lb (93 kg)   SpO2 100%   BMI 27.05 kg/m   Hgba1c 8.0%     Assessment & Plan:   HARDING THOMURE comes in today with chief complaint of Medical Management of Chronic Issues   Diagnosis and orders addressed:  1. Primary hypertension Low sodium diet - CBC with Differential/Platelet - CMP14+EGFR - amLODipine (NORVASC) 10 MG tablet; Take 1 tablet (10 mg total) by mouth daily.  Dispense: 90 tablet; Refill: 1 - hydrochlorothiazide (HYDRODIURIL) 25 MG tablet; Take 1 tablet (25 mg total) by mouth daily.  Dispense: 90 tablet; Refill: 1 - losartan (COZAAR) 50 MG tablet; Take 1 tablet (50 mg total) by mouth daily.  Dispense: 90 tablet; Refill: 1  2. Mixed hyperlipidemia Low fat diet - Lipid panel - atorvastatin (LIPITOR) 40 MG tablet; Take 1 tablet (40 mg total) by mouth daily.  Dispense: 90 tablet; Refill: 1  3. Type 2 diabetes mellitus without complication, without long-term current use of insulin (HCC) Strict carb counting -  Bayer DCA Hb A1c Waived - Microalbumin / creatinine urine ratio - metFORMIN (GLUCOPHAGE) 500 MG tablet; Take 1 tablet (500 mg total) by mouth 2 (two) times daily with a meal.  Dispense: 180 tablet; Refill: 1  4. Benign prostatic hyperplasia without lower urinary tract symptoms Report any voiding issues - PSA, total and free - finasteride (PROSCAR) 5 MG tablet; Take 1 tablet (5 mg total) by mouth daily.  Dispense:  90 tablet; Refill: 1  5. BMI 32.0-32.9,adult Discussed diet and exercise for person with BMI >25 Will recheck weight in 3-6 months    Labs pending Health Maintenance reviewed Diet and exercise encouraged  Follow up plan: 3 months   [name] Done, FNP

## 2022-08-03 NOTE — Patient Instructions (Signed)

## 2022-08-04 LAB — LIPID PANEL
Chol/HDL Ratio: 3.3 ratio (ref 0.0–5.0)
Cholesterol, Total: 143 mg/dL (ref 100–199)
HDL: 44 mg/dL (ref >39)
LDL Chol Calc (NIH): 82 mg/dL (ref 0–99)
Triglycerides: 89 mg/dL (ref 0–149)
VLDL Cholesterol Cal: 17 mg/dL (ref 5–40)

## 2022-08-04 LAB — CBC WITH DIFFERENTIAL/PLATELET
Basophils Absolute: 0 10*3/uL (ref 0.0–0.2)
Basos: 1 %
EOS (ABSOLUTE): 0.2 10*3/uL (ref 0.0–0.4)
Eos: 4 %
Hematocrit: 38.1 % (ref 37.5–51.0)
Hemoglobin: 12.7 g/dL — ABNORMAL LOW (ref 13.0–17.7)
Immature Grans (Abs): 0 10*3/uL (ref 0.0–0.1)
Immature Granulocytes: 0 %
Lymphocytes Absolute: 1.6 10*3/uL (ref 0.7–3.1)
Lymphs: 29 %
MCH: 28.9 pg (ref 26.6–33.0)
MCHC: 33.3 g/dL (ref 31.5–35.7)
MCV: 87 fL (ref 79–97)
Monocytes Absolute: 0.4 10*3/uL (ref 0.1–0.9)
Monocytes: 7 %
Neutrophils Absolute: 3.4 10*3/uL (ref 1.4–7.0)
Neutrophils: 59 %
Platelets: 305 10*3/uL (ref 150–450)
RBC: 4.4 x10E6/uL (ref 4.14–5.80)
RDW: 12.9 % (ref 11.6–15.4)
WBC: 5.7 10*3/uL (ref 3.4–10.8)

## 2022-08-04 LAB — CMP14+EGFR
ALT: 11 IU/L (ref 0–44)
AST: 15 IU/L (ref 0–40)
Albumin/Globulin Ratio: 1.2 (ref 1.2–2.2)
Albumin: 4.2 g/dL (ref 3.8–4.8)
Alkaline Phosphatase: 115 IU/L (ref 44–121)
BUN/Creatinine Ratio: 9 — ABNORMAL LOW (ref 10–24)
BUN: 14 mg/dL (ref 8–27)
Bilirubin Total: 0.7 mg/dL (ref 0.0–1.2)
CO2: 22 mmol/L (ref 20–29)
Calcium: 10.4 mg/dL — ABNORMAL HIGH (ref 8.6–10.2)
Chloride: 104 mmol/L (ref 96–106)
Creatinine, Ser: 1.5 mg/dL — ABNORMAL HIGH (ref 0.76–1.27)
Globulin, Total: 3.4 g/dL (ref 1.5–4.5)
Glucose: 132 mg/dL — ABNORMAL HIGH (ref 70–99)
Potassium: 4.4 mmol/L (ref 3.5–5.2)
Sodium: 141 mmol/L (ref 134–144)
Total Protein: 7.6 g/dL (ref 6.0–8.5)
eGFR: 49 mL/min/{1.73_m2} — ABNORMAL LOW (ref >59)

## 2022-08-04 LAB — PSA, TOTAL AND FREE
PSA, Free Pct: 25.5 %
PSA, Free: 1.2 ng/mL
Prostate Specific Ag, Serum: 4.7 ng/mL — ABNORMAL HIGH (ref 0.0–4.0)

## 2022-08-04 LAB — MICROALBUMIN / CREATININE URINE RATIO
Creatinine, Urine: 154.8 mg/dL
Microalb/Creat Ratio: 316 mg/g creat — ABNORMAL HIGH (ref 0–29)
Microalbumin, Urine: 489.3 ug/mL

## 2022-10-08 DIAGNOSIS — H35033 Hypertensive retinopathy, bilateral: Secondary | ICD-10-CM | POA: Diagnosis not present

## 2022-10-08 DIAGNOSIS — H524 Presbyopia: Secondary | ICD-10-CM | POA: Diagnosis not present

## 2022-10-08 LAB — HM DIABETES EYE EXAM

## 2022-10-18 ENCOUNTER — Ambulatory Visit (INDEPENDENT_AMBULATORY_CARE_PROVIDER_SITE_OTHER): Payer: Medicare HMO

## 2022-10-18 VITALS — Ht 73.0 in | Wt 205.0 lb

## 2022-10-18 DIAGNOSIS — Z0000 Encounter for general adult medical examination without abnormal findings: Secondary | ICD-10-CM | POA: Diagnosis not present

## 2022-10-18 NOTE — Progress Notes (Signed)
Subjective:   Jonathon Adkins is a 72 y.o. male who presents for an Initial Medicare Annual Wellness Visit. I connected with  ANGELDEJESUS VANAMBURG on 10/18/22 by a audio enabled telemedicine application and verified that I am speaking with the correct person using two identifiers.  Patient Location: Home  Provider Location: Home Office  I discussed the limitations of evaluation and management by telemedicine. The patient expressed understanding and agreed to proceed.  Review of Systems     Cardiac Risk Factors include: advanced age (>23mn, >>40women);diabetes mellitus;hypertension;male gender;dyslipidemia     Objective:    Today's Vitals   10/18/22 0825  Weight: 205 lb (93 kg)  Height: '6\' 1"'$  (1.854 m)   Body mass index is 27.05 kg/m.     10/18/2022    8:28 AM 10/14/2021    8:22 AM 04/08/2021   11:23 AM 09/18/2014    9:47 AM 06/12/2014    9:24 AM 02/06/2014   11:45 AM  Advanced Directives  Does Patient Have a Medical Advance Directive? No No No No No Patient does not have advance directive;Patient would like information  Would patient like information on creating a medical advance directive? No - Patient declined No - Patient declined  Yes - EScientist, clinical (histocompatibility and immunogenetics)given Yes - EScientist, clinical (histocompatibility and immunogenetics)given Advance directive packet given  Pre-existing out of facility DNR order (yellow form or pink MOST form)      No    Current Medications (verified) Outpatient Encounter Medications as of 10/18/2022  Medication Sig   amLODipine (NORVASC) 10 MG tablet Take 1 tablet (10 mg total) by mouth daily.   atorvastatin (LIPITOR) 40 MG tablet Take 1 tablet (40 mg total) by mouth daily.   augmented betamethasone dipropionate (DIPROLENE-AF) 0.05 % ointment Apply 2 times daily to new blisters or itchy spots on scalp and body. Do not use on face, armpits or groin.   cholecalciferol (VITAMIN D) 1000 UNITS tablet Take 1 tablet (1,000 Units total) by mouth daily.   clobetasol ointment (TEMOVATE) 0.05 % Apply  2 times per day to blisters on scalp and body. Do not use on face.   finasteride (PROSCAR) 5 MG tablet Take 1 tablet (5 mg total) by mouth daily.   hydrochlorothiazide (HYDRODIURIL) 25 MG tablet Take 1 tablet (25 mg total) by mouth daily.   losartan (COZAAR) 50 MG tablet Take 1 tablet (50 mg total) by mouth daily.   metFORMIN (GLUCOPHAGE) 500 MG tablet Take 1 tablet (500 mg total) by mouth 2 (two) times daily with a meal.   No facility-administered encounter medications on file as of 10/18/2022.    Allergies (verified) Codeine and Lisinopril   History: Past Medical History:  Diagnosis Date   Diabetes mellitus without complication ([place]y [place]    Enlarged prostate    Hyperlipidemia    Hypertension    Past Surgical History:  Procedure Laterality Date   COLONOSCOPY N/A 02/06/2014   Procedure: COLONOSCOPY;  Surgeon: NRogene Houston MD;  Location: [facility];  Service: Endoscopy;  Laterality: N/A;  100   Family History  Problem Relation Age of Onset   Diabetes Mother    Coronary artery disease Father    Social History   Socioeconomic History   Marital status: Married    Spouse name: EEstill Bamberg  Number of children: 1   Years of education: Not on file   Highest education level: Not on file  Occupational History   Not on file  Tobacco Use   Smoking status: Never  Smokeless tobacco: Never  Substance and Sexual Activity   Alcohol use: No   Drug use: No   Sexual activity: Not Currently  Other Topics Concern   Not on file  Social History Narrative   Married x 39 years 04/29/2022.   1 daughter, lives in [place].   Social Determinants of Health   Financial Resource Strain: Low Risk  (10/18/2022)   Overall Financial Resource Strain (CARDIA)    Difficulty of Paying Living Expenses: Not hard at all  Food Insecurity: No Food Insecurity (10/18/2022)   Hunger Vital Sign    Worried About Running Out of Food in the Last Year: Never true    Ran Out of Food in the Last Year: Never true   Transportation Needs: No Transportation Needs (10/18/2022)   PRAPARE - Hydrologist (Medical): No    Lack of Transportation (Non-Medical): No  Physical Activity: Insufficiently Active (10/18/2022)   Exercise Vital Sign    Days of Exercise per Week: 3 days    Minutes of Exercise per Session: 30 min  Stress: No Stress Concern Present (10/18/2022)   [place]    Feeling of Stress : Not at all  Social Connections: Moderately Integrated (10/18/2022)   Social Connection and Isolation Panel [NHANES]    Frequency of Communication with Friends and Family: More than three times a week    Frequency of Social Gatherings with Friends and Family: More than three times a week    Attends Religious Services: More than 4 times per year    Active Member of [employer] or Organizations: No    Attends Music therapist: Never    Marital Status: Married    Tobacco Counseling Counseling given: Not Answered   Clinical Intake:  Pre-visit preparation completed: Yes  Pain : No/denies pain     Nutritional Risks: None Diabetes: Yes CBG done?: No Did pt. bring in CBG monitor from home?: No  How often do you need to have someone help you when you read instructions, pamphlets, or other written materials from your doctor or pharmacy?: 1 - Never  Diabetic?yes  Nutrition Risk Assessment:  Has the patient had any N/V/D within the last 2 months?  No  Does the patient have any non-healing wounds?  No  Has the patient had any unintentional weight loss or weight gain?  No   Diabetes:  Is the patient diabetic?  Yes  If diabetic, was a CBG obtained today?  No  Did the patient bring in their glucometer from home?  No  How often do you monitor your CBG's? 3 x week .   Financial Strains and Diabetes Management:  Are you having any financial strains with the device, your supplies or your medication? No .   Does the patient want to be seen by Chronic Care Management for management of their diabetes?  No  Would the patient like to be referred to a Nutritionist or for Diabetic Management?  No   Diabetic Exams:  Diabetic Eye Exam: Completed 09/2022 Diabetic Foot Exam: Overdue, Pt has been advised about the importance in completing this exam. Pt is scheduled for diabetic foot exam on next office visit .   Interpreter Needed?: No  Information entered by :: Jadene Pierini, LPN   Activities of Daily Living    10/18/2022    8:28 AM  In your present state of health, do you have any difficulty performing the following activities:  Hearing? 0  Vision? 0  Difficulty concentrating or making decisions? 0  Walking or climbing stairs? 0  Dressing or bathing? 0  Doing errands, shopping? 0  Preparing Food and eating ? N  Using the Toilet? N  In the past six months, have you accidently leaked urine? N  Do you have problems with loss of bowel control? N  Managing your Medications? N  Managing your Finances? N  Housekeeping or managing your Housekeeping? N    Patient Care Team: Chevis Pretty, FNP as PCP - General (Nurse Practitioner)  Indicate any recent Medical Services you may have received from other than [facility] providers in the past year (date may be approximate).     Assessment:   This is a routine wellness examination for [place].  Hearing/Vision screen Vision Screening - Comments:: Wears rx glasses - up to date with routine eye exams with  Dr.Davis   Dietary issues and exercise activities discussed: Current Exercise Habits: Home exercise routine, Type of exercise: walking, Time (Minutes): 30, Frequency (Times/Week): 3, Weekly Exercise (Minutes/Week): 90, Exercise limited by: None identified   Goals Addressed             This Visit's Progress    Exercise 3x per week (30 min per time)   On track    Continue to increase exercise. Fish more.       Depression Screen     10/18/2022    8:27 AM 08/03/2022    8:40 AM 02/01/2022    8:29 AM 10/14/2021    8:17 AM 08/03/2021    8:37 AM 05/04/2021    8:16 AM 10/31/2020    8:08 AM  PHQ 2/9 Scores  PHQ - 2 Score 0 0 0 0 0 0 0  PHQ- 9 Score  0 0  0 0     Fall Risk    10/18/2022    8:26 AM 08/03/2022    8:40 AM 02/01/2022    8:29 AM 10/14/2021    8:24 AM 08/03/2021    8:37 AM  Fall Risk   Falls in the past year? 0 0 0 1 0  Number falls in past yr: 0   0   Injury with Fall? 0   0   Risk for fall due to : No Fall Risks   History of fall(s);Other (Comment)   Risk for fall due to: Comment    Pt states he passed out and went to ER to be checked out, no injury.   Follow up Falls prevention discussed   Falls prevention discussed     FALL RISK PREVENTION PERTAINING TO THE HOME:  Any stairs in or around the home? No  If so, are there any without handrails? No  Home free of loose throw rugs in walkways, pet beds, electrical cords, etc? Yes  Adequate lighting in your home to reduce risk of falls? Yes   ASSISTIVE DEVICES UTILIZED TO PREVENT FALLS:  Life alert? No  Use of a cane, walker or w/c? No  Grab bars in the bathroom? No  Shower chair or bench in shower? No  Elevated toilet seat or a handicapped toilet? No          10/18/2022    8:29 AM 10/14/2021    8:28 AM  6CIT Screen  What Year? 0 points 0 points  What month? 0 points 0 points  What time? 0 points 0 points  Count back from 20 0 points 0 points  Months in reverse 0 points 0  points  Repeat phrase 0 points 2 points  Total Score 0 points 2 points    Immunizations Immunization History  Administered Date(s) Administered   Influenza,inj,Quad PF,6+ Mos 07/10/2015   Moderna Sars-Covid-2 Vaccination 01/26/2020, 02/23/2020, 09/22/2020   Pneumococcal Conjugate-13 10/13/2015   Pneumococcal Polysaccharide-23 03/02/2015    TDAP status: Due, Education has been provided regarding the importance of this vaccine. Advised may receive this vaccine at local  pharmacy or Health Dept. Aware to provide a copy of the vaccination record if obtained from local pharmacy or Health Dept. Verbalized acceptance and understanding.  Flu Vaccine status: Declined, Education has been provided regarding the importance of this vaccine but patient still declined. Advised may receive this vaccine at local pharmacy or Health Dept. Aware to provide a copy of the vaccination record if obtained from local pharmacy or Health Dept. Verbalized acceptance and understanding.  Pneumococcal vaccine status: Up to date  Covid-19 vaccine status: Completed vaccines  Qualifies for Shingles Vaccine? Yes   Zostavax completed No   Shingrix Completed?: No.    Education has been provided regarding the importance of this vaccine. Patient has been advised to call insurance company to determine out of pocket expense if they have not yet received this vaccine. Advised may also receive vaccine at local pharmacy or Health Dept. Verbalized acceptance and understanding.  Screening Tests Health Maintenance  Topic Date Due   DTaP/Tdap/Td (1 - Tdap) Never done   COVID-19 Vaccine (4 - 2023-24 season) 04/23/2022   OPHTHALMOLOGY EXAM  06/16/2022   Zoster Vaccines- Shingrix (1 of 2) 11/02/2022 (Originally 03/16/1970)   INFLUENZA VACCINE  11/21/2022 (Originally 03/23/2022)   HEMOGLOBIN A1C  02/02/2023   Diabetic kidney evaluation - eGFR measurement  08/04/2023   Diabetic kidney evaluation - Urine ACR  08/04/2023   FOOT EXAM  08/04/2023   Medicare Annual Wellness (AWV)  10/19/2023   COLONOSCOPY (Pts 45-45yr Insurance coverage will need to be confirmed)  02/07/2024   Pneumonia Vaccine 72 Years old  Completed   Hepatitis C Screening  Completed   HPV VACCINES  Aged Out    Health Maintenance  Health Maintenance Due  Topic Date Due   DTaP/Tdap/Td (1 - Tdap) Never done   COVID-19 Vaccine (4 - 2023-24 season) 04/23/2022   OPHTHALMOLOGY EXAM  06/16/2022    Colorectal cancer screening: Type of  screening: Colonoscopy. Completed 02/06/2014. Repeat every 10 years  Lung Cancer Screening: (Low Dose CT Chest recommended if Age 74-80years, 30 pack-year currently smoking OR have quit w/in 15years.) does not qualify.   Lung Cancer Screening Referral: n/a  Additional Screening:  Hepatitis C Screening: does not qualify; Completed 07/10/2015  Vision Screening: Recommended annual ophthalmology exams for early detection of glaucoma and other disorders of the eye. Is the patient up to date with their annual eye exam?  Yes  Who is the provider or what is the name of the office in which the patient attends annual eye exams? Dr.Davis  If pt is not established with a provider, would they like to be referred to a provider to establish care? No .   Dental Screening: Recommended annual dental exams for proper oral hygiene  Community Resource Referral / Chronic Care Management: CRR required this visit?  No   CCM required this visit?  No      Plan:     I have personally reviewed and noted the following in the patient's chart:   Medical and social history Use of alcohol, tobacco or illicit drugs  Current  medications and supplements including opioid prescriptions. Patient is not currently taking opioid prescriptions. Functional ability and status Nutritional status Physical activity Advanced directives List of other physicians Hospitalizations, surgeries, and ER visits in previous 12 months Vitals Screenings to include cognitive, depression, and falls Referrals and appointments  In addition, I have reviewed and discussed with patient certain preventive protocols, quality metrics, and best practice recommendations. A written personalized care plan for preventive services as well as general preventive health recommendations were provided to patient.     Daphane Shepherd, LPN   [id number]   Nurse Notes: Due TDAP Vaccine

## 2022-10-18 NOTE — Patient Instructions (Signed)
Jonathon Adkins , Thank you for taking time to come for your Medicare Wellness Visit. I appreciate your ongoing commitment to your health goals. Please review the following plan we discussed and let me know if I can assist you in the future.   These are the goals we discussed:  Goals      Exercise 3x per week (30 min per time)     Continue to increase exercise. Fish more.        This is a list of the screening recommended for you and due dates:  Health Maintenance  Topic Date Due   DTaP/Tdap/Td vaccine (1 - Tdap) Never done   COVID-19 Vaccine (4 - 2023-24 season) 04/23/2022   Eye exam for diabetics  06/16/2022   Zoster (Shingles) Vaccine (1 of 2) 11/02/2022*   Flu Shot  11/21/2022*   Hemoglobin A1C  02/02/2023   Yearly kidney function blood test for diabetes  08/04/2023   Yearly kidney health urinalysis for diabetes  08/04/2023   Complete foot exam   08/04/2023   Medicare Annual Wellness Visit  10/19/2023   Colon Cancer Screening  02/07/2024   Pneumonia Vaccine  Completed   Hepatitis C Screening: USPSTF Recommendation to screen - Ages 18-79 yo.  Completed   HPV Vaccine  Aged Out  *Topic was postponed. The date shown is not the original due date.    Advanced directives: Advance directive discussed with you today. I have provided a copy for you to complete at home and have notarized. Once this is complete please bring a copy in to our office so we can scan it into your chart.   Conditions/risks identified: Aim for 30 minutes of exercise or brisk walking, 6-8 glasses of water, and 5 servings of fruits and vegetables each day.   Next appointment: Follow up in one year for your annual wellness visit.   Preventive Care 47 Years and Older, Male  Preventive care refers to lifestyle choices and visits with your health care provider that can promote health and wellness. What does preventive care include? A yearly physical exam. This is also called an annual well check. Dental exams once or  twice a year. Routine eye exams. Ask your health care provider how often you should have your eyes checked. Personal lifestyle choices, including: Daily care of your teeth and gums. Regular physical activity. Eating a healthy diet. Avoiding tobacco and drug use. Limiting alcohol use. Practicing safe sex. Taking low doses of aspirin every day. Taking vitamin and mineral supplements as recommended by your health care provider. What happens during an annual well check? The services and screenings done by your health care provider during your annual well check will depend on your age, overall health, lifestyle risk factors, and family history of disease. Counseling  Your health care provider may ask you questions about your: Alcohol use. Tobacco use. Drug use. Emotional well-being. Home and relationship well-being. Sexual activity. Eating habits. History of falls. Memory and ability to understand (cognition). Work and work Statistician. Screening  You may have the following tests or measurements: Height, weight, and BMI. Blood pressure. Lipid and cholesterol levels. These may be checked every 5 years, or more frequently if you are over 41 years old. Skin check. Lung cancer screening. You may have this screening every year starting at age 88 if you have a 30-pack-year history of smoking and currently smoke or have quit within the past 15 years. Fecal occult blood test (FOBT) of the stool. You may have this  test every year starting at age 44. Flexible sigmoidoscopy or colonoscopy. You may have a sigmoidoscopy every 5 years or a colonoscopy every 10 years starting at age 37. Prostate cancer screening. Recommendations will vary depending on your family history and other risks. Hepatitis C blood test. Hepatitis B blood test. Sexually transmitted disease (STD) testing. Diabetes screening. This is done by checking your blood sugar (glucose) after you have not eaten for a while (fasting).  You may have this done every 1-3 years. Abdominal aortic aneurysm (AAA) screening. You may need this if you are a current or former smoker. Osteoporosis. You may be screened starting at age 77 if you are at high risk. Talk with your health care provider about your test results, treatment options, and if necessary, the need for more tests. Vaccines  Your health care provider may recommend certain vaccines, such as: Influenza vaccine. This is recommended every year. Tetanus, diphtheria, and acellular pertussis (Tdap, Td) vaccine. You may need a Td booster every 10 years. Zoster vaccine. You may need this after age 51. Pneumococcal 13-valent conjugate (PCV13) vaccine. One dose is recommended after age 26. Pneumococcal polysaccharide (PPSV23) vaccine. One dose is recommended after age 56. Talk to your health care provider about which screenings and vaccines you need and how often you need them. This information is not intended to replace advice given to you by your health care provider. Make sure you discuss any questions you have with your health care provider. Document Released: 09/05/2015 Document Revised: 04/28/2016 Document Reviewed: 06/10/2015 Elsevier Interactive Patient Education  2017 [place] Prevention in the Home Falls can cause injuries. They can happen to people of all ages. There are many things you can do to make your home safe and to help prevent falls. What can I do on the outside of my home? Regularly fix the edges of walkways and driveways and fix any cracks. Remove anything that might make you trip as you walk through a door, such as a raised step or threshold. Trim any bushes or trees on the path to your home. Use bright outdoor lighting. Clear any walking paths of anything that might make someone trip, such as rocks or tools. Regularly check to see if handrails are loose or broken. Make sure that both sides of any steps have handrails. Any raised decks and  porches should have guardrails on the edges. Have any leaves, snow, or ice cleared regularly. Use sand or salt on walking paths during winter. Clean up any spills in your garage right away. This includes oil or grease spills. What can I do in the bathroom? Use night lights. Install grab bars by the toilet and in the tub and shower. Do not use towel bars as grab bars. Use non-skid mats or decals in the tub or shower. If you need to sit down in the shower, use a plastic, non-slip stool. Keep the floor dry. Clean up any water that spills on the floor as soon as it happens. Remove soap buildup in the tub or shower regularly. Attach bath mats securely with double-sided non-slip rug tape. Do not have throw rugs and other things on the floor that can make you trip. What can I do in the bedroom? Use night lights. Make sure that you have a light by your bed that is easy to reach. Do not use any sheets or blankets that are too big for your bed. They should not hang down onto the floor. Have a firm chair that  has side arms. You can use this for support while you get dressed. Do not have throw rugs and other things on the floor that can make you trip. What can I do in the kitchen? Clean up any spills right away. Avoid walking on wet floors. Keep items that you use a lot in easy-to-reach places. If you need to reach something above you, use a strong step stool that has a grab bar. Keep electrical cords out of the way. Do not use floor polish or wax that makes floors slippery. If you must use wax, use non-skid floor wax. Do not have throw rugs and other things on the floor that can make you trip. What can I do with my stairs? Do not leave any items on the stairs. Make sure that there are handrails on both sides of the stairs and use them. Fix handrails that are broken or loose. Make sure that handrails are as long as the stairways. Check any carpeting to make sure that it is firmly attached to the  stairs. Fix any carpet that is loose or worn. Avoid having throw rugs at the top or bottom of the stairs. If you do have throw rugs, attach them to the floor with carpet tape. Make sure that you have a light switch at the top of the stairs and the bottom of the stairs. If you do not have them, ask someone to add them for you. What else can I do to help prevent falls? Wear shoes that: Do not have high heels. Have rubber bottoms. Are comfortable and fit you well. Are closed at the toe. Do not wear sandals. If you use a stepladder: Make sure that it is fully opened. Do not climb a closed stepladder. Make sure that both sides of the stepladder are locked into place. Ask someone to hold it for you, if possible. Clearly mark and make sure that you can see: Any grab bars or handrails. First and last steps. Where the edge of each step is. Use tools that help you move around (mobility aids) if they are needed. These include: Canes. Walkers. Scooters. Crutches. Turn on the lights when you go into a dark area. Replace any light bulbs as soon as they burn out. Set up your furniture so you have a clear path. Avoid moving your furniture around. If any of your floors are uneven, fix them. If there are any pets around you, be aware of where they are. Review your medicines with your doctor. Some medicines can make you feel dizzy. This can increase your chance of falling. Ask your doctor what other things that you can do to help prevent falls. This information is not intended to replace advice given to you by your health care provider. Make sure you discuss any questions you have with your health care provider. Document Released: 06/05/2009 Document Revised: 01/15/2016 Document Reviewed: 09/13/2014 Elsevier Interactive Patient Education  2017 [employer].

## 2022-11-02 ENCOUNTER — Encounter: Payer: Self-pay | Admitting: Nurse Practitioner

## 2022-11-02 ENCOUNTER — Ambulatory Visit (INDEPENDENT_AMBULATORY_CARE_PROVIDER_SITE_OTHER): Payer: Medicare HMO | Admitting: Nurse Practitioner

## 2022-11-02 VITALS — BP 132/77 | HR 82 | Temp 98.2°F | Ht 73.0 in | Wt 214.0 lb

## 2022-11-02 DIAGNOSIS — Z6832 Body mass index (BMI) 32.0-32.9, adult: Secondary | ICD-10-CM

## 2022-11-02 DIAGNOSIS — E782 Mixed hyperlipidemia: Secondary | ICD-10-CM

## 2022-11-02 DIAGNOSIS — Z0000 Encounter for general adult medical examination without abnormal findings: Secondary | ICD-10-CM

## 2022-11-02 DIAGNOSIS — N400 Enlarged prostate without lower urinary tract symptoms: Secondary | ICD-10-CM | POA: Diagnosis not present

## 2022-11-02 DIAGNOSIS — E119 Type 2 diabetes mellitus without complications: Secondary | ICD-10-CM

## 2022-11-02 DIAGNOSIS — L03115 Cellulitis of right lower limb: Secondary | ICD-10-CM | POA: Diagnosis not present

## 2022-11-02 DIAGNOSIS — I10 Essential (primary) hypertension: Secondary | ICD-10-CM

## 2022-11-02 DIAGNOSIS — Z0001 Encounter for general adult medical examination with abnormal findings: Secondary | ICD-10-CM

## 2022-11-02 LAB — BAYER DCA HB A1C WAIVED: HB A1C (BAYER DCA - WAIVED): 6.8 % — ABNORMAL HIGH (ref 4.8–5.6)

## 2022-11-02 MED ORDER — LANCETS MISC. MISC: 1.0000 | Freq: Three times a day (TID) | 0 refills | Status: AC

## 2022-11-02 MED ORDER — BLOOD GLUCOSE TEST VI STRP: 1.0000 | ORAL_STRIP | Freq: Three times a day (TID) | 0 refills | Status: AC

## 2022-11-02 MED ORDER — SULFAMETHOXAZOLE-TRIMETHOPRIM 800-160 MG PO TABS: 1.0000 | ORAL_TABLET | Freq: Two times a day (BID) | ORAL | 0 refills | Status: DC

## 2022-11-02 MED ORDER — LANCET DEVICE MISC: 1.0000 | Freq: Three times a day (TID) | 0 refills | Status: AC

## 2022-11-02 MED ORDER — FUROSEMIDE 20 MG PO TABS: 20.0000 mg | ORAL_TABLET | Freq: Every day | ORAL | 3 refills | Status: DC

## 2022-11-02 MED ORDER — BLOOD GLUCOSE MONITORING SUPPL DEVI: 1.0000 | Freq: Three times a day (TID) | 0 refills | Status: DC

## 2022-11-02 NOTE — Patient Instructions (Signed)
Unna Boot Care An Unna boot is a type of bandage (dressing) for the foot and leg. The dressing is a wrap made of gauze that is soaked with a medicine called zinc oxide. The gauze may also include other lotions and medicines that help in wound healing, such as calamine. An Unna boot may be used to: Treat open sores (venous ulcers) or graft sites on the foot, heel, or leg. Help with swelling from conditions that affect the veins or lymphatic system (lymphedema). Treat skin conditions such as inflammation caused by poor blood flow (stasis dermatitis). Heal wounds on parts of the body below the hips (lower extremities). The dressing is applied by a health care provider. The gauze is wrapped around your lower extremity in several layers that overlap. These layers usually start at the toes and go up to the knee. A dry outer wrap goes over the medicated wrap for support and pressure (compression). Before applying the Unna boot, your health care provider will clean your leg and foot and may apply an antibiotic. You may be asked to raise (elevate) your leg for a while to reduce swelling before the boot is put on. The boot will dry and harden after it is applied. It may need to be changed or replaced once or twice a week. Follow these instructions at home: Boot care Wear the Unna boot as told by your health care provider. You may need to wear a slipper or shoe over the boot that is one or two sizes larger than normal. Do not stick anything inside the boot to scratch your skin. Doing that increases your risk of infection. Check the skin around the boot every day. Tell your health care provider about any concerns. Keep your Unna boot clean and dry. Check the area around the boot every day for signs of infection. Check for: Redness, swelling, or pain in your foot or toes. Fluid or blood coming from the boot. Warmth. Pus or a bad smell. A rash, itching, or red, swollen areas of skin (hives). Remove the boot  and call your health care provider if you have signs of poor blood flow, such as: Your toes tingle or become numb. Your toes turn cold or turn blue or pale. Your toes are more swollen or painful. You cannot move your toes. Bathing Do not take baths, swim, or use a hot tub until your health care provider approves. Ask your health care provider if you may take showers. You may only be allowed to take sponge baths. If your health care provider says that you can take a bath or shower: Do not let the Unna boot get wet. Cover the boot with a watertight covering when you shower. Keep your leg with the boot out of the bathtub when you take a bath. Activity Rest as told by your health care provider. Do not sit for a long time without moving. Get up to take short walks every 1-2 hours. This will improve blood flow and breathing. Ask for help if you feel weak or unsteady. You may walk with the boot once it has dried. Ask your health care provider how much walking is safe for you. General instructions Take over-the-counter and prescription medicines only as told by your health care provider. Keep your leg elevated above the level of your heart while you are sitting or lying down. This will decrease swelling. Do not sit with your knee bent for long periods of time. Do not use any products that contain nicotine   or tobacco. These products include cigarettes, chewing tobacco, and vaping devices, such as e-cigarettes. If you need help quitting, ask your health care provider. Keep all follow-up visits. Your health care provider will change your boot once or twice a week until it is no longer needed. Contact a health care provider if: Your skin feels itchy inside the boot. You feel burning or have a rash or hives in the boot area. You have a fever or chills. You have any signs of infection. You have more numbness or pain in your foot or toes. The skin on your foot or toes changes colors. This may include the  skin turning blue or pale or having patchy areas with spots. Your boot has been damaged or feels like it no longer fits like it should. This information is not intended to replace advice given to you by your health care provider. Make sure you discuss any questions you have with your health care provider. Document Revised: 01/04/2022 Document Reviewed: 01/04/2022 Elsevier Patient Education  2023 Elsevier Inc.  

## 2022-11-02 NOTE — Progress Notes (Signed)
Subjective:    Patient ID: Jonathon Adkins, male    DOB: October 16, 1950, 72 y.o.   MRN: [id number]   Chief Complaint: annual physical   HPI:  Jonathon Adkins is a 72 y.o. who identifies as a male who was assigned male at birth.   Social history: Lives with: wife Work history: retired   Scientist, forensic in today for follow up of the following chronic medical issues:  1. Annual physical exam  2. Primary hypertension No c/o chest pain, sob or headache. BP Readings from Last 3 Encounters:  08/03/22 (!) 140/78  02/01/22 130/76  08/03/21 138/87     3. Mixed hyperlipidemia Does watch diet and stays very active Lab Results  Component Value Date   CHOL 143 08/03/2022   HDL 44 08/03/2022   LDLCALC 82 08/03/2022   TRIG 89 08/03/2022   CHOLHDL 3.3 08/03/2022     4. Type 2 diabetes mellitus without complication, without long-term current use of insulin (HCC) He has not been checking his blood sugars. Needs a new meter. He does watch his diet closely. Lab Results  Component Value Date   HGBA1C 8.0 (H) 08/03/2022     5. Benign prostatic hyperplasia without lower urinary tract symptoms No voiding issues. Lab Results  Component Value Date   PSA1 4.7 (H) 08/03/2022   PSA1 4.2 (H) 10/31/2020   PSA1 3.0 10/15/2019      6. BMI 32.0-32.9,adult Weight is up 9lbs Wt Readings from Last 3 Encounters:  11/02/22 214 lb (97.1 kg)  10/18/22 205 lb (93 kg)  08/03/22 205 lb (93 kg)   BMI Readings from Last 3 Encounters:  11/02/22 28.23 kg/m  10/18/22 27.05 kg/m  08/03/22 27.05 kg/m     New complaints: Right lower leg edema. Has been coming on for over a week.  Allergies  Allergen Reactions   Codeine    Lisinopril    Outpatient Encounter Medications as of 11/02/2022  Medication Sig   amLODipine (NORVASC) 10 MG tablet Take 1 tablet (10 mg total) by mouth daily.   atorvastatin (LIPITOR) 40 MG tablet Take 1 tablet (40 mg total) by mouth daily.   augmented betamethasone dipropionate  (DIPROLENE-AF) 0.05 % ointment Apply 2 times daily to new blisters or itchy spots on scalp and body. Do not use on face, armpits or groin.   cholecalciferol (VITAMIN D) 1000 UNITS tablet Take 1 tablet (1,000 Units total) by mouth daily.   clobetasol ointment (TEMOVATE) 0.05 % Apply 2 times per day to blisters on scalp and body. Do not use on face.   finasteride (PROSCAR) 5 MG tablet Take 1 tablet (5 mg total) by mouth daily.   hydrochlorothiazide (HYDRODIURIL) 25 MG tablet Take 1 tablet (25 mg total) by mouth daily.   losartan (COZAAR) 50 MG tablet Take 1 tablet (50 mg total) by mouth daily.   metFORMIN (GLUCOPHAGE) 500 MG tablet Take 1 tablet (500 mg total) by mouth 2 (two) times daily with a meal.   No facility-administered encounter medications on file as of 11/02/2022.    Past Surgical History:  Procedure Laterality Date   COLONOSCOPY N/A 02/06/2014   Procedure: COLONOSCOPY;  Surgeon: Rogene Houston, MD;  Location: [facility];  Service: Endoscopy;  Laterality: N/A;  100    Family History  Problem Relation Age of Onset   Diabetes Mother    Coronary artery disease Father       Controlled substance contract: n/a     Review of Systems  Constitutional:  Negative for diaphoresis.  Eyes:  Negative for pain.  Respiratory:  Negative for shortness of breath.   Cardiovascular:  Negative for chest pain, palpitations and leg swelling (right lower leg only).  Gastrointestinal:  Negative for abdominal pain.  Endocrine: Negative for polydipsia.  Skin:  Negative for rash.  Neurological:  Negative for dizziness, weakness and headaches.  Hematological:  Does not bruise/bleed easily.  All other systems reviewed and are negative.      Objective:   Physical Exam Vitals and nursing note reviewed.  Constitutional:      Appearance: Normal appearance. He is well-developed.  HENT:     Head: Normocephalic.     Nose: Nose normal.     Mouth/Throat:     Mouth: Mucous membranes are moist.      Pharynx: Oropharynx is clear.  Eyes:     Pupils: Pupils are equal, round, and reactive to light.  Neck:     Thyroid: No thyroid mass or thyromegaly.     Vascular: No carotid bruit or JVD.     Trachea: Phonation normal.  Cardiovascular:     Rate and Rhythm: Normal rate and regular rhythm.  Pulmonary:     Effort: Pulmonary effort is normal. No respiratory distress.     Breath sounds: Normal breath sounds.  Abdominal:     General: Bowel sounds are normal.     Palpations: Abdomen is soft.     Tenderness: There is no abdominal tenderness.  Musculoskeletal:        General: Normal range of motion.     Cervical back: Normal range of motion and neck supple.     Right lower leg: Edema (3+) present.     Left lower leg: No edema.  Lymphadenopathy:     Cervical: No cervical adenopathy.  Skin:    General: Skin is warm and dry.  Neurological:     Mental Status: He is alert and oriented to person, place, and time.  Psychiatric:        Behavior: Behavior normal.        Thought Content: Thought content normal.        Judgment: Judgment normal.    BP 132/77   Pulse 82   Temp 98.2 F (36.8 C) (Temporal)   Ht '6\' 1"'$  (1.854 m)   Wt 214 lb (97.1 kg)   SpO2 98%   BMI 28.23 kg/m   Hgba1c discussed at appointment 6.8%     Assessment & Plan:   Jonathon Adkins in today with chief complaint of Medical Management of Chronic Issues   1. Annual physical exam   2. Primary hypertension Low sodium diet Stop HCTZ Start lasix '20mg'$  daily - CBC with Differential/Platelet - CMP14+EGFR - EKG 12-Lead  3. Mixed hyperlipidemia Low fat diet - Lipid panel  4. Type 2 diabetes mellitus without complication, without long-term current use of insulin (HCC) Continue to watch carbs in diet - Bayer DCA Hb A1c Waived  5. Benign prostatic hyperplasia without lower urinary tract symptoms Report any voiding issues - PSA, total and free  6. BMI 32.0-32.9,adult Discussed diet and exercise for person  with BMI >25 Will recheck weight in 3-6 months   7. Cellulitis of right lower extremity Elevate legs when sitting - furosemide (LASIX) 20 MG tablet; Take 1 tablet (20 mg total) by mouth daily.  Dispense: 30 tablet; Refill: 3 - Apply unna boot    The above assessment and management plan was discussed with the patient. The patient verbalized understanding  of and has agreed to the management plan. Patient is aware to call the clinic if symptoms persist or worsen. Patient is aware when to return to the clinic for a follow-up visit. Patient educated on when it is appropriate to go to the emergency department.   [name] Done, FNP

## 2022-11-03 LAB — PSA, TOTAL AND FREE
PSA, Free Pct: 21.9 %
PSA, Free: 1.18 ng/mL
Prostate Specific Ag, Serum: 5.4 ng/mL — ABNORMAL HIGH (ref 0.0–4.0)

## 2022-11-03 LAB — CBC WITH DIFFERENTIAL/PLATELET
Basophils Absolute: 0.1 10*3/uL (ref 0.0–0.2)
Basos: 2 %
EOS (ABSOLUTE): 0.1 10*3/uL (ref 0.0–0.4)
Eos: 4 %
Hematocrit: 36.4 % — ABNORMAL LOW (ref 37.5–51.0)
Hemoglobin: 12.1 g/dL — ABNORMAL LOW (ref 13.0–17.7)
Immature Grans (Abs): 0 10*3/uL (ref 0.0–0.1)
Immature Granulocytes: 0 %
Lymphocytes Absolute: 1.3 10*3/uL (ref 0.7–3.1)
Lymphs: 40 %
MCH: 28.5 pg (ref 26.6–33.0)
MCHC: 33.2 g/dL (ref 31.5–35.7)
MCV: 86 fL (ref 79–97)
Monocytes Absolute: 0.3 10*3/uL (ref 0.1–0.9)
Monocytes: 10 %
Neutrophils Absolute: 1.5 10*3/uL (ref 1.4–7.0)
Neutrophils: 44 %
Platelets: 241 10*3/uL (ref 150–450)
RBC: 4.25 x10E6/uL (ref 4.14–5.80)
RDW: 13.6 % (ref 11.6–15.4)
WBC: 3.3 10*3/uL — ABNORMAL LOW (ref 3.4–10.8)

## 2022-11-03 LAB — CMP14+EGFR
ALT: 13 IU/L (ref 0–44)
AST: 16 IU/L (ref 0–40)
Albumin/Globulin Ratio: 1.3 (ref 1.2–2.2)
Albumin: 4 g/dL (ref 3.8–4.8)
Alkaline Phosphatase: 99 IU/L (ref 44–121)
BUN/Creatinine Ratio: 10 (ref 10–24)
BUN: 14 mg/dL (ref 8–27)
Bilirubin Total: 0.6 mg/dL (ref 0.0–1.2)
CO2: 21 mmol/L (ref 20–29)
Calcium: 9.6 mg/dL (ref 8.6–10.2)
Chloride: 100 mmol/L (ref 96–106)
Creatinine, Ser: 1.43 mg/dL — ABNORMAL HIGH (ref 0.76–1.27)
Globulin, Total: 3 g/dL (ref 1.5–4.5)
Glucose: 101 mg/dL — ABNORMAL HIGH (ref 70–99)
Potassium: 4.6 mmol/L (ref 3.5–5.2)
Sodium: 138 mmol/L (ref 134–144)
Total Protein: 7 g/dL (ref 6.0–8.5)
eGFR: 52 mL/min/{1.73_m2} — ABNORMAL LOW (ref >59)

## 2022-11-03 LAB — LIPID PANEL
Chol/HDL Ratio: 3.1 ratio (ref 0.0–5.0)
Cholesterol, Total: 141 mg/dL (ref 100–199)
HDL: 46 mg/dL (ref >39)
LDL Chol Calc (NIH): 81 mg/dL (ref 0–99)
Triglycerides: 72 mg/dL (ref 0–149)
VLDL Cholesterol Cal: 14 mg/dL (ref 5–40)

## 2022-11-05 ENCOUNTER — Encounter: Payer: Self-pay | Admitting: Nurse Practitioner

## 2022-11-05 ENCOUNTER — Ambulatory Visit (INDEPENDENT_AMBULATORY_CARE_PROVIDER_SITE_OTHER): Payer: Medicare HMO | Admitting: Nurse Practitioner

## 2022-11-05 VITALS — BP 133/76 | HR 86 | Temp 98.2°F | Resp 20 | Ht 73.0 in | Wt 210.0 lb

## 2022-11-05 DIAGNOSIS — I878 Other specified disorders of veins: Secondary | ICD-10-CM | POA: Diagnosis not present

## 2022-11-05 NOTE — Progress Notes (Signed)
   Subjective:    Patient ID: Jonathon Adkins, male    DOB: 06/22/1951, 72 y.o.   MRN: [id number]   Chief Complaint: follow up cellulitis  HPI Patient was seen on  11/02/22 for annual physical. He had significant edema in right lower leg with erythema. We started him on lasix and stopped his HCTZ. We gave him no antibiotic. But we did apply Unna boot. He is here today to have unna boot removed and reevaluate. Wt Readings from Last 3 Encounters:  11/05/22 210 lb (95.3 kg)  11/02/22 214 lb (97.1 kg)  10/18/22 205 lb (93 kg)     Patient Active Problem List   Diagnosis Date Noted   Blistering of skin 04/24/2020   BMI 32.0-32.9,adult 03/28/2015   BPH (benign prostatic hyperplasia) 05/11/2013   Hypertension 01/29/2013   Hyperlipidemia 01/29/2013   [place] 01/29/2013       Review of Systems  Constitutional:  Negative for diaphoresis.  Eyes:  Negative for pain.  Respiratory:  Negative for shortness of breath.   Cardiovascular:  Negative for chest pain, palpitations and leg swelling.  Gastrointestinal:  Negative for abdominal pain.  Endocrine: Negative for polydipsia.  Skin:  Negative for rash.  Neurological:  Negative for dizziness, weakness and headaches.  Hematological:  Does not bruise/bleed easily.  All other systems reviewed and are negative.      Objective:   Physical Exam Vitals reviewed.  Constitutional:      Appearance: Normal appearance.  Cardiovascular:     Rate and Rhythm: Normal rate and regular rhythm.     Heart sounds: Normal heart sounds.  Pulmonary:     Effort: Pulmonary effort is normal.     Breath sounds: Normal breath sounds.  Musculoskeletal:     Right lower leg: Edema (1+) present.     Left lower leg: Edema (1+) present.  Skin:    General: Skin is warm.  Neurological:     General: No focal deficit present.     Mental Status: He is oriented to person, place, and time.  Psychiatric:        Mood and Affect: Mood normal.        Behavior:  Behavior normal.    BP 133/76   Pulse 86   Temp 98.2 F (36.8 C) (Temporal)   Resp 20   Ht 6\' 1"  (1.854 m)   Wt 210 lb (95.3 kg)   SpO2 99%   BMI 27.71 kg/m         Assessment & Plan:  Jonathon Adkins in today with chief complaint of No chief complaint on file.   1. Venous stasis Compression socks Elevate legs when sitting Continue lasix daily Follow up as scheduled   The above assessment and management plan was discussed with the patient. The patient verbalized understanding of and has agreed to the management plan. Patient is aware to call the clinic if symptoms persist or worsen. Patient is aware when to return to the clinic for a follow-up visit. Patient educated on when it is appropriate to go to the emergency department.   [name] Done, FNP

## 2022-11-05 NOTE — Patient Instructions (Signed)
Peripheral Edema  Peripheral edema is swelling that is caused by a buildup of fluid. Peripheral edema most often affects the lower legs, ankles, and feet. It can also develop in the arms, hands, and face. The area of the body that has peripheral edema will look swollen. It may also feel heavy or warm. Your clothes may start to feel tight. Pressing on the area may make a temporary dent in your skin (pitting edema). You may not be able to move your swollen arm or leg as much as usual. There are many causes of peripheral edema. It can happen because of a complication of other conditions such as heart failure, kidney disease, or a problem with your circulation. It also can be a side effect of certain medicines or happen because of an infection. It often happens to women during pregnancy. Sometimes, the cause is not known. Follow these instructions at home: Managing pain, stiffness, and swelling  Raise (elevate) your legs while you are sitting or lying down. Move around often to prevent stiffness and to reduce swelling. Do not sit or stand for long periods of time. Do not wear tight clothing. Do not wear garters on your upper legs. Exercise your legs to get your circulation going. This helps to move the fluid back into your blood vessels, and it may help the swelling go down. Wear compression stockings as told by your health care provider. These stockings help to prevent blood clots and reduce swelling in your legs. It is important that these are the correct size. These stockings should be prescribed by your doctor to prevent possible injuries. If elastic bandages or wraps are recommended, use them as told by your health care provider. Medicines Take over-the-counter and prescription medicines only as told by your health care provider. Your health care provider may prescribe medicine to help your body get rid of excess water (diuretic). Take this medicine if you are told to take it. General  instructions Eat a low-salt (low-sodium) diet as told by your health care provider. Sometimes, eating less salt may reduce swelling. Pay attention to any changes in your symptoms. Moisturize your skin daily to help prevent skin from cracking and draining. Keep all follow-up visits. This is important. Contact a health care provider if: You have a fever. You have swelling in only one leg. You have increased swelling, redness, or pain in one or both of your legs. You have drainage or sores at the area where you have edema. Get help right away if: You have edema that starts suddenly or is getting worse, especially if you are pregnant or have a medical condition. You develop shortness of breath, especially when you are lying down. You have pain in your chest or abdomen. You feel weak. You feel like you will faint. These symptoms may be an emergency. Get help right away. Call 911. Do not wait to see if the symptoms will go away. Do not drive yourself to the hospital. Summary Peripheral edema is swelling that is caused by a buildup of fluid. Peripheral edema most often affects the lower legs, ankles, and feet. Move around often to prevent stiffness and to reduce swelling. Do not sit or stand for long periods of time. Pay attention to any changes in your symptoms. Contact a health care provider if you have edema that starts suddenly or is getting worse, especially if you are pregnant or have a medical condition. Get help right away if you develop shortness of breath, especially when lying down.   This information is not intended to replace advice given to you by your health care provider. Make sure you discuss any questions you have with your health care provider. Document Revised: 04/13/2021 Document Reviewed: 04/13/2021 Elsevier Patient Education  2023 [employer] Inc[employer]  

## 2022-12-03 DIAGNOSIS — R972 Elevated prostate specific antigen [PSA]: Secondary | ICD-10-CM | POA: Diagnosis not present

## 2022-12-10 DIAGNOSIS — N400 Enlarged prostate without lower urinary tract symptoms: Secondary | ICD-10-CM | POA: Diagnosis not present

## 2022-12-10 DIAGNOSIS — R972 Elevated prostate specific antigen [PSA]: Secondary | ICD-10-CM | POA: Diagnosis not present

## 2023-01-28 ENCOUNTER — Other Ambulatory Visit: Payer: Self-pay | Admitting: Nurse Practitioner

## 2023-01-28 DIAGNOSIS — L03115 Cellulitis of right lower limb: Secondary | ICD-10-CM

## 2023-02-09 ENCOUNTER — Other Ambulatory Visit: Payer: Self-pay | Admitting: Nurse Practitioner

## 2023-03-04 ENCOUNTER — Other Ambulatory Visit: Payer: Self-pay | Admitting: Nurse Practitioner

## 2023-03-04 DIAGNOSIS — E782 Mixed hyperlipidemia: Secondary | ICD-10-CM

## 2023-04-01 ENCOUNTER — Other Ambulatory Visit: Payer: Self-pay | Admitting: Nurse Practitioner

## 2023-04-01 DIAGNOSIS — I10 Essential (primary) hypertension: Secondary | ICD-10-CM

## 2023-04-02 ENCOUNTER — Other Ambulatory Visit: Payer: Self-pay | Admitting: Nurse Practitioner

## 2023-04-02 DIAGNOSIS — I10 Essential (primary) hypertension: Secondary | ICD-10-CM

## 2023-04-02 DIAGNOSIS — E119 Type 2 diabetes mellitus without complications: Secondary | ICD-10-CM

## 2023-04-04 ENCOUNTER — Telehealth: Payer: Self-pay | Admitting: Nurse Practitioner

## 2023-04-04 DIAGNOSIS — L03115 Cellulitis of right lower limb: Secondary | ICD-10-CM

## 2023-04-04 MED ORDER — FUROSEMIDE 20 MG PO TABS: 20.0000 mg | ORAL_TABLET | Freq: Every day | ORAL | 1 refills | Status: DC

## 2023-04-04 NOTE — Telephone Encounter (Signed)
Patient calling because he is almost out of hydrochlorothiazide, explained that based on our records this medication was discontinued. Said he does not remember PCP telling him this and wants to talk to nurse about it because he only has 3 pills left.

## 2023-04-04 NOTE — Telephone Encounter (Signed)
Spoke with patient and per chart he was to stop the hydrochlorothiazide in March and start Furosemide. He states that he has been taking both. Advised him to stop the hydrochlorothiazide and continue with Furosemide. Will recheck at appt next month. Patient verbalized understanding

## 2023-04-08 ENCOUNTER — Other Ambulatory Visit: Payer: Self-pay | Admitting: Nurse Practitioner

## 2023-04-08 DIAGNOSIS — N400 Enlarged prostate without lower urinary tract symptoms: Secondary | ICD-10-CM

## 2023-04-19 DIAGNOSIS — H40013 Open angle with borderline findings, low risk, bilateral: Secondary | ICD-10-CM | POA: Diagnosis not present

## 2023-05-05 ENCOUNTER — Encounter: Payer: Self-pay | Admitting: Nurse Practitioner

## 2023-05-05 ENCOUNTER — Ambulatory Visit (INDEPENDENT_AMBULATORY_CARE_PROVIDER_SITE_OTHER): Payer: Medicare HMO | Admitting: Nurse Practitioner

## 2023-05-05 VITALS — BP 137/76 | HR 78 | Temp 97.6°F | Resp 20 | Ht 73.0 in | Wt 201.0 lb

## 2023-05-05 DIAGNOSIS — E119 Type 2 diabetes mellitus without complications: Secondary | ICD-10-CM | POA: Diagnosis not present

## 2023-05-05 DIAGNOSIS — E1169 Type 2 diabetes mellitus with other specified complication: Secondary | ICD-10-CM

## 2023-05-05 DIAGNOSIS — L03115 Cellulitis of right lower limb: Secondary | ICD-10-CM | POA: Diagnosis not present

## 2023-05-05 DIAGNOSIS — I10 Essential (primary) hypertension: Secondary | ICD-10-CM | POA: Diagnosis not present

## 2023-05-05 DIAGNOSIS — E785 Hyperlipidemia, unspecified: Secondary | ICD-10-CM

## 2023-05-05 DIAGNOSIS — Z6832 Body mass index (BMI) 32.0-32.9, adult: Secondary | ICD-10-CM

## 2023-05-05 DIAGNOSIS — Z7984 Long term (current) use of oral hypoglycemic drugs: Secondary | ICD-10-CM

## 2023-05-05 DIAGNOSIS — N400 Enlarged prostate without lower urinary tract symptoms: Secondary | ICD-10-CM

## 2023-05-05 LAB — BAYER DCA HB A1C WAIVED: HB A1C (BAYER DCA - WAIVED): 6.3 % — ABNORMAL HIGH (ref 4.8–5.6)

## 2023-05-05 MED ORDER — FINASTERIDE 5 MG PO TABS
5.0000 mg | ORAL_TABLET | Freq: Every day | ORAL | 1 refills | Status: DC
Start: 2023-05-05 — End: 2023-10-25

## 2023-05-05 MED ORDER — LOSARTAN POTASSIUM 50 MG PO TABS: 50.0000 mg | ORAL_TABLET | Freq: Every day | ORAL | 1 refills | Status: DC

## 2023-05-05 MED ORDER — FUROSEMIDE 20 MG PO TABS
20.0000 mg | ORAL_TABLET | Freq: Every day | ORAL | 1 refills | Status: DC
Start: 2023-05-05 — End: 2023-10-25

## 2023-05-05 MED ORDER — AMLODIPINE BESYLATE 10 MG PO TABS
10.0000 mg | ORAL_TABLET | Freq: Every day | ORAL | 1 refills | Status: DC
Start: 2023-05-05 — End: 2023-11-30

## 2023-05-05 MED ORDER — ATORVASTATIN CALCIUM 40 MG PO TABS: 40.0000 mg | ORAL_TABLET | Freq: Every day | ORAL | 1 refills | Status: DC

## 2023-05-05 MED ORDER — METFORMIN HCL 500 MG PO TABS
500.0000 mg | ORAL_TABLET | Freq: Two times a day (BID) | ORAL | 1 refills | Status: DC
Start: 2023-05-05 — End: 2023-10-25

## 2023-05-05 NOTE — Patient Instructions (Signed)

## 2023-05-05 NOTE — Progress Notes (Signed)
Subjective:    Patient ID: Jonathon Adkins, male    DOB: Sep 27, 1950, 72 y.o.   MRN: [id number]   Chief Complaint: medical management of chronic issues     HPI:  Jonathon Adkins is a 72 y.o. who identifies as a male who was assigned male at birth.   Social history: Lives with: wife Work history: retired   Water engineer in today for follow up of the following chronic medical issues:  1. Primary hypertension No c/o chest pain, sob or headache. Does not check blood pressure at home. BP Readings from Last 3 Encounters:  11/05/22 133/76  11/02/22 132/77  08/03/22 (!) 140/78     2. Hyperlipidemia associated with type 2 diabetes mellitus (HCC) Does watch diet and tries  to stay active. Lab Results  Component Value Date   CHOL 141 11/02/2022   HDL 46 11/02/2022   LDLCALC 81 11/02/2022   TRIG 72 11/02/2022   CHOLHDL 3.1 11/02/2022     3. Diabetes mellitus treated with oral medication (HCC) Fasting blod sugars are stable. Around 110-130. No low blood sugars Lab Results  Component Value Date   HGBA1C 6.8 (H) 11/02/2022     4. Benign prostatic hyperplasia without lower urinary tract symptoms No voiding issues Lab Results  Component Value Date   PSA1 5.4 (H) 11/02/2022   PSA1 4.7 (H) 08/03/2022   PSA1 4.2 (H) 10/31/2020      5. BMI 32.0-32.9,adult No recent weight changes Wt Readings from Last 3 Encounters:  05/05/23 201 lb (91.2 kg)  11/05/22 210 lb (95.3 kg)  11/02/22 214 lb (97.1 kg)   BMI Readings from Last 3 Encounters:  05/05/23 26.52 kg/m  11/05/22 27.71 kg/m  11/02/22 28.23 kg/m     New complaints: None today  Allergies  Allergen Reactions   Codeine    Lisinopril    Outpatient Encounter Medications as of 05/05/2023  Medication Sig   amLODipine (NORVASC) 10 MG tablet TAKE 1 TABLET BY MOUTH EVERY DAY   atorvastatin (LIPITOR) 40 MG tablet TAKE 1 TABLET BY MOUTH EVERY DAY   augmented betamethasone dipropionate (DIPROLENE-AF) 0.05 % ointment Apply 2  times daily to new blisters or itchy spots on scalp and body. Do not use on face, armpits or groin.   Blood Glucose Monitoring Suppl (ONETOUCH VERIO FLEX SYSTEM) w/Device KIT Test BS in morning, noon and at bedtime Dx !11.9   cholecalciferol (VITAMIN D) 1000 UNITS tablet Take 1 tablet (1,000 Units total) by mouth daily.   clobetasol ointment (TEMOVATE) 0.05 % Apply 2 times per day to blisters on scalp and body. Do not use on face.   finasteride (PROSCAR) 5 MG tablet TAKE 1 TABLET (5 MG TOTAL) BY MOUTH DAILY.   furosemide (LASIX) 20 MG tablet Take 1 tablet (20 mg total) by mouth daily.   losartan (COZAAR) 50 MG tablet TAKE 1 TABLET BY MOUTH EVERY DAY   metFORMIN (GLUCOPHAGE) 500 MG tablet TAKE 1 TABLET BY MOUTH 2 TIMES DAILY WITH A MEAL.   sulfamethoxazole-trimethoprim (BACTRIM DS) 800-160 MG tablet Take 1 tablet by mouth 2 (two) times daily.   No facility-administered encounter medications on file as of 05/05/2023.    Past Surgical History:  Procedure Laterality Date   COLONOSCOPY N/A 02/06/2014   Procedure: COLONOSCOPY;  Surgeon: Malissa Hippo, MD;  Location: [facility];  Service: Endoscopy;  Laterality: N/A;  100    Family History  Problem Relation Age of Onset   Diabetes Mother    Coronary  artery disease Father       Controlled substance contract: n/a     Review of Systems  Constitutional:  Negative for diaphoresis.  Eyes:  Negative for pain.  Respiratory:  Negative for shortness of breath.   Cardiovascular:  Negative for chest pain, palpitations and leg swelling.  Gastrointestinal:  Negative for abdominal pain.  Endocrine: Negative for polydipsia.  Skin:  Negative for rash.  Neurological:  Negative for dizziness, weakness and headaches.  Hematological:  Does not bruise/bleed easily.  All other systems reviewed and are negative.      Objective:   Physical Exam Vitals and nursing note reviewed.  Constitutional:      Appearance: Normal appearance. He is  well-developed.  HENT:     Head: Normocephalic.     Nose: Nose normal.     Mouth/Throat:     Mouth: Mucous membranes are moist.     Pharynx: Oropharynx is clear.  Eyes:     Pupils: Pupils are equal, round, and reactive to light.  Neck:     Thyroid: No thyroid mass or thyromegaly.     Vascular: No carotid bruit or JVD.     Trachea: Phonation normal.  Cardiovascular:     Rate and Rhythm: Normal rate and regular rhythm.  Pulmonary:     Effort: Pulmonary effort is normal. No respiratory distress.     Breath sounds: Normal breath sounds.  Abdominal:     General: Bowel sounds are normal.     Palpations: Abdomen is soft.     Tenderness: There is no abdominal tenderness.  Musculoskeletal:        General: Normal range of motion.     Cervical back: Normal range of motion and neck supple.  Lymphadenopathy:     Cervical: No cervical adenopathy.  Skin:    General: Skin is warm and dry.  Neurological:     Mental Status: He is alert and oriented to person, place, and time.  Psychiatric:        Behavior: Behavior normal.        Thought Content: Thought content normal.        Judgment: Judgment normal.     BP 137/76   Pulse 78   Temp 97.6 F (36.4 C) (Temporal)   Resp 20   Ht 6\' 1"  (1.854 m)   Wt 201 lb (91.2 kg)   SpO2 100%   BMI 26.52 kg/m   Hgba1c 6.3%     Assessment & Plan:   Jonathon Adkins comes in today with chief complaint of Medical Management of Chronic Issues   Diagnosis and orders addressed:  1. Primary hypertension Low sodium diet - CBC with Differential/Platelet - CMP14+EGFR - amLODipine (NORVASC) 10 MG tablet; Take 1 tablet (10 mg total) by mouth daily.  Dispense: 90 tablet; Refill: 1 - losartan (COZAAR) 50 MG tablet; Take 1 tablet (50 mg total) by mouth daily.  Dispense: 90 tablet; Refill: 1  2. Hyperlipidemia associated with type 2 diabetes mellitus (HCC) Low fat diet - Lipid panel - atorvastatin (LIPITOR) 40 MG tablet; Take 1 tablet (40 mg total) by  mouth daily.  Dispense: 90 tablet; Refill: 1  3. Diabetes mellitus treated with oral medication (HCC) Continue to watch carbs in diet - Bayer DCA Hb A1c Waived - metFORMIN (GLUCOPHAGE) 500 MG tablet; Take 1 tablet (500 mg total) by mouth 2 (two) times daily with a meal.  Dispense: 180 tablet; Refill: 1  4. Benign prostatic hyperplasia without lower urinary tract symptoms  Report any voiding issues - PSA, total and free - finasteride (PROSCAR) 5 MG tablet; Take 1 tablet (5 mg total) by mouth daily.  Dispense: 90 tablet; Refill: 1  5. BMI 32.0-32.9,adult Discussed diet and exercise for person with BMI >25 Will recheck weight in 3-6 months   6. Cellulitis of right lower extremity Elevate legs when sitting - furosemide (LASIX) 20 MG tablet; Take 1 tablet (20 mg total) by mouth daily.  Dispense: 90 tablet; Refill: 1   Labs pending Health Maintenance reviewed Diet and exercise encouraged  Follow up plan: 6 months    Mary-Margaret Daphine Deutscher, FNP

## 2023-05-06 LAB — CBC WITH DIFFERENTIAL/PLATELET
Basophils Absolute: 0 10*3/uL (ref 0.0–0.2)
Basos: 1 %
EOS (ABSOLUTE): 0.1 10*3/uL (ref 0.0–0.4)
Eos: 3 %
Hematocrit: 34.5 % — ABNORMAL LOW (ref 37.5–51.0)
Hemoglobin: 11.5 g/dL — ABNORMAL LOW (ref 13.0–17.7)
Immature Grans (Abs): 0 10*3/uL (ref 0.0–0.1)
Immature Granulocytes: 0 %
Lymphocytes Absolute: 1.2 10*3/uL (ref 0.7–3.1)
Lymphs: 40 %
MCH: 27.5 pg (ref 26.6–33.0)
MCHC: 33.3 g/dL (ref 31.5–35.7)
MCV: 83 fL (ref 79–97)
Monocytes Absolute: 0.3 10*3/uL (ref 0.1–0.9)
Monocytes: 11 %
Neutrophils Absolute: 1.3 10*3/uL — ABNORMAL LOW (ref 1.4–7.0)
Neutrophils: 45 %
Platelets: 312 10*3/uL (ref 150–450)
RBC: 4.18 x10E6/uL (ref 4.14–5.80)
RDW: 13.6 % (ref 11.6–15.4)
WBC: 2.9 10*3/uL — ABNORMAL LOW (ref 3.4–10.8)

## 2023-05-06 LAB — CMP14+EGFR
ALT: 12 IU/L (ref 0–44)
AST: 15 IU/L (ref 0–40)
Albumin: 4 g/dL (ref 3.8–4.8)
Alkaline Phosphatase: 95 IU/L (ref 44–121)
BUN/Creatinine Ratio: 12 (ref 10–24)
BUN: 16 mg/dL (ref 8–27)
Bilirubin Total: 1.1 mg/dL (ref 0.0–1.2)
CO2: 23 mmol/L (ref 20–29)
Calcium: 9.6 mg/dL (ref 8.6–10.2)
Chloride: 103 mmol/L (ref 96–106)
Creatinine, Ser: 1.32 mg/dL — ABNORMAL HIGH (ref 0.76–1.27)
Globulin, Total: 3.1 g/dL (ref 1.5–4.5)
Glucose: 87 mg/dL (ref 70–99)
Potassium: 4.3 mmol/L (ref 3.5–5.2)
Sodium: 139 mmol/L (ref 134–144)
Total Protein: 7.1 g/dL (ref 6.0–8.5)
eGFR: 57 mL/min/{1.73_m2} — ABNORMAL LOW (ref >59)

## 2023-05-06 LAB — LIPID PANEL
Chol/HDL Ratio: 2.8 ratio (ref 0.0–5.0)
Cholesterol, Total: 123 mg/dL (ref 100–199)
HDL: 44 mg/dL (ref >39)
LDL Chol Calc (NIH): 66 mg/dL (ref 0–99)
Triglycerides: 61 mg/dL (ref 0–149)
VLDL Cholesterol Cal: 13 mg/dL (ref 5–40)

## 2023-05-06 LAB — PSA, TOTAL AND FREE
PSA, Free Pct: 19.4 %
PSA, Free: 1.22 ng/mL
Prostate Specific Ag, Serum: 6.3 ng/mL — ABNORMAL HIGH (ref 0.0–4.0)

## 2023-06-20 DIAGNOSIS — R972 Elevated prostate specific antigen [PSA]: Secondary | ICD-10-CM | POA: Diagnosis not present

## 2023-06-20 DIAGNOSIS — N400 Enlarged prostate without lower urinary tract symptoms: Secondary | ICD-10-CM | POA: Diagnosis not present

## 2023-07-07 IMAGING — MR MR HEAD W/O CM
11 of 12 series · 42 of 48 positions shown · non-contrast
Comparison: Head CT April 08, 2021.

CLINICAL DATA: Head trauma. Syncopal episode striking head on the
ground.

EXAM:
MRI HEAD WITHOUT CONTRAST
TECHNIQUE: Multiplanar, multiecho pulse sequences of the brain and surrounding
structures were obtained without intravenous contrast.

[Series 5: DWI · axial · 4.0mm · 0.92mm/px · z∈[-114,+41]mm · 4 of 40 slices shown (1 of 4)]
[im 1/40]
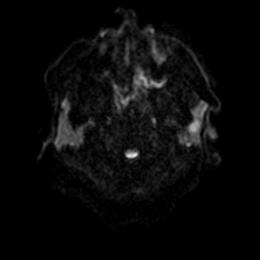
[im 14/40]
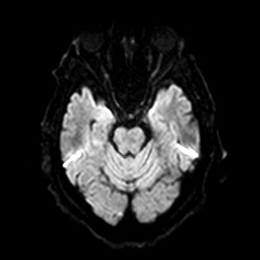
[im 27/40]
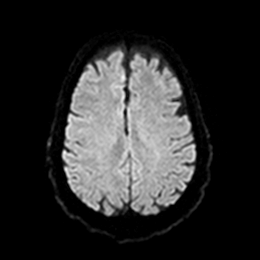
[im 40/40]
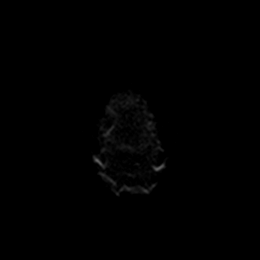

[Series 6: DWI · axial · 4.0mm · 0.92mm/px · z∈[-114,+41]mm · 4 of 40 slices shown (2 of 4)]
[im 1/40]
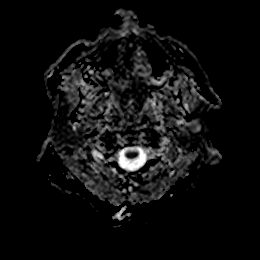
[im 14/40]
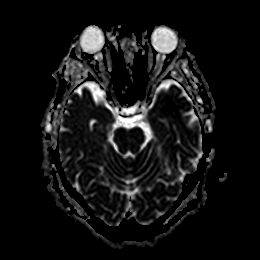
[im 27/40]
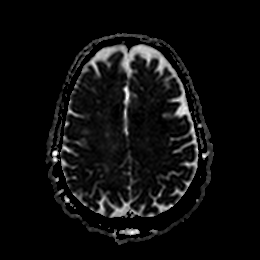
[im 40/40]
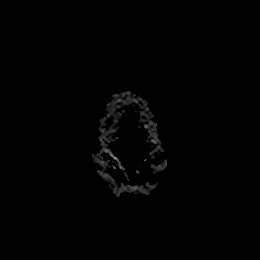

[Series 7: DWI · coronal · 5.0mm · 0.88mm/px · 3 of 32 slices shown (3 of 4)]
[im 1/32]
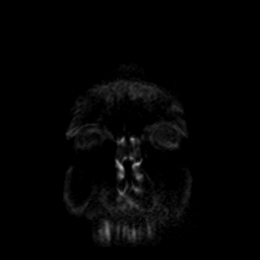
[im 16/32]
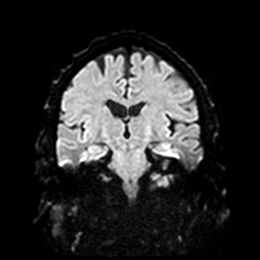
[im 32/32]
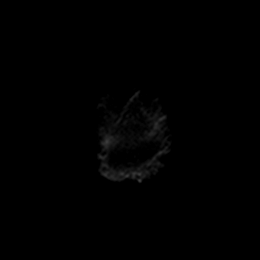

[Series 8: DWI · coronal · 5.0mm · 0.88mm/px · 3 of 32 slices shown (4 of 4)]
[im 1/32]
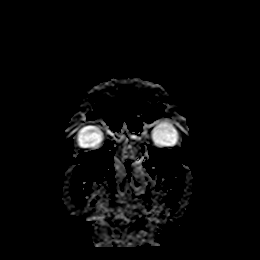
[im 16/32]
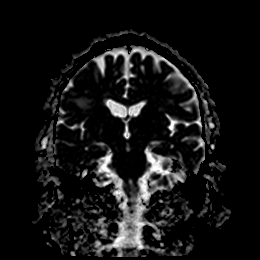
[im 32/32]
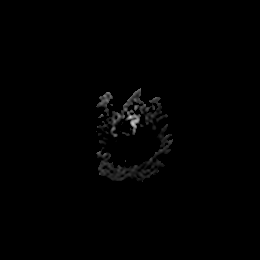

[Series 9: T1 · sagittal · 5.0mm · 0.78mm/px · 2 of 21 slices shown]
[im 1/21]
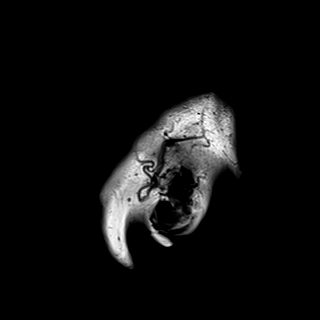
[im 21/21]
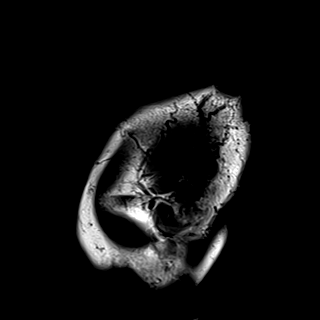

[Series 10: T2 · axial · 5.0mm · 0.72mm/px · z∈[-121,+38]mm · 2 of 24 slices shown (1 of 2)]
[im 1/24]
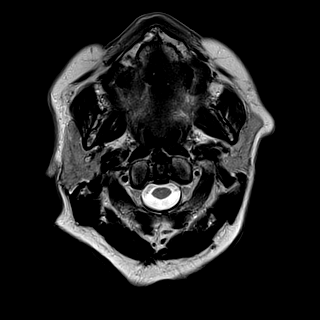
[im 24/24]
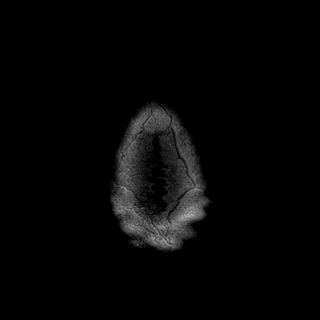

[Series 11: mag_images · axial · 3.0mm · 0.90mm/px · z∈[-122,+41]mm · 6 of 56 slices shown]
[im 1/56]
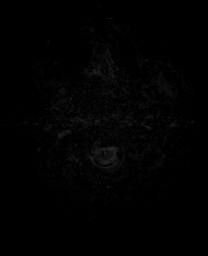
[im 12/56]
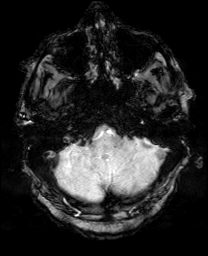
[im 23/56]
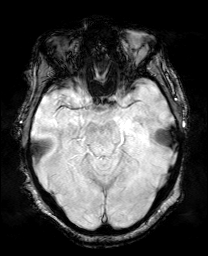
[im 34/56]
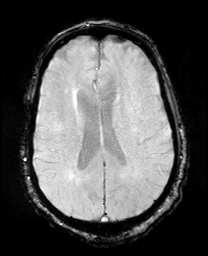
[im 45/56]
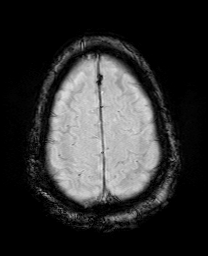
[im 56/56]
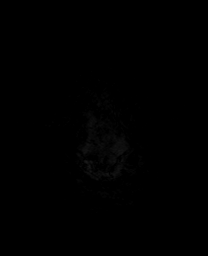

[Series 12: pha_images · axial · 3.0mm · 0.90mm/px · z∈[-119,+41]mm · 5 of 55 slices shown]
[im 1/55]
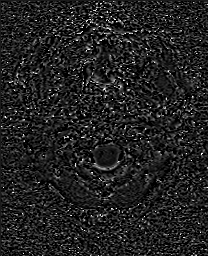
[im 14/55]
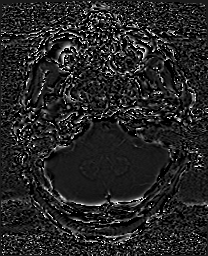
[im 28/55]
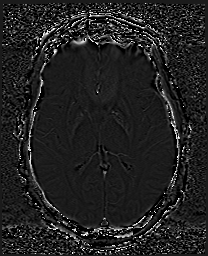
[im 41/55]
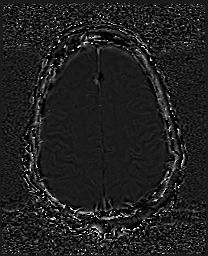
[im 55/55]
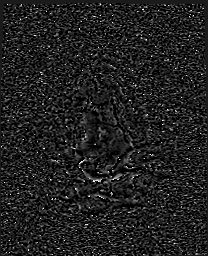

[Series 13: swi_images · axial · 3.0mm · 0.90mm/px · z∈[-122,+41]mm · 6 of 56 slices shown]
[im 1/56]
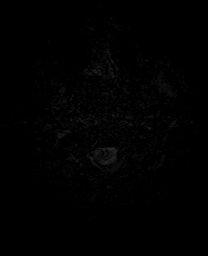
[im 12/56]
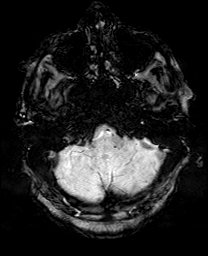
[im 23/56]
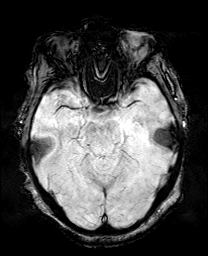
[im 34/56]
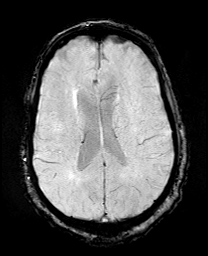
[im 45/56]
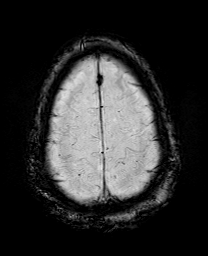
[im 56/56]
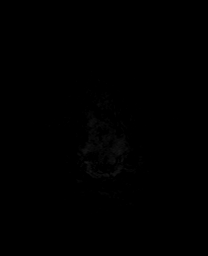

[Series 15: FLAIR · axial · 4.0mm · 0.45mm/px · z∈[-116,+31]mm · 4 of 38 slices shown]
[im 1/38]
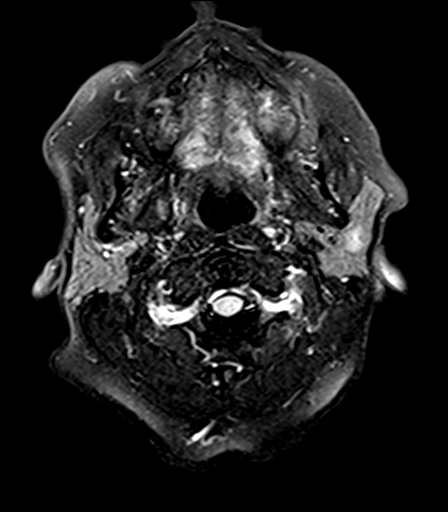
[im 13/38]
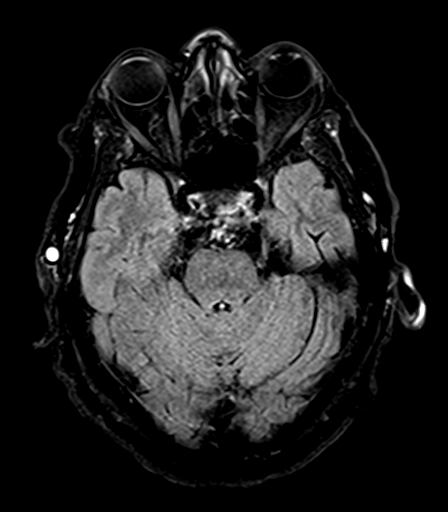
[im 25/38]
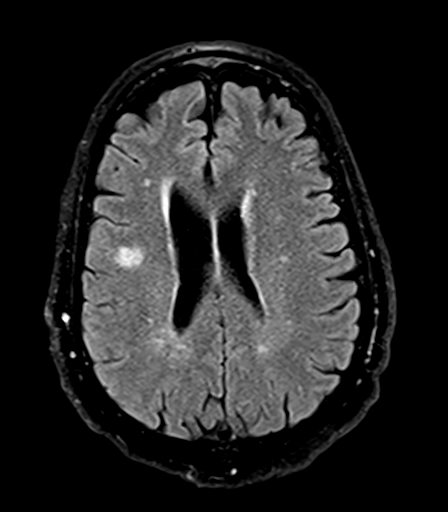
[im 38/38]
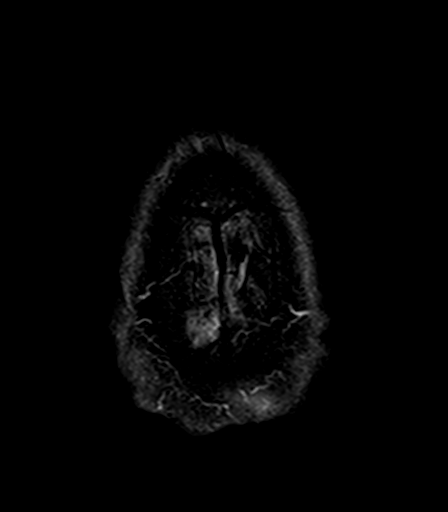

[Series 17: T2 · coronal · 5.0mm · 0.72mm/px · 3 of 32 slices shown (2 of 2)]
[im 1/32]
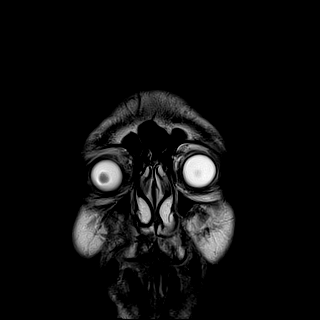
[im 16/32]
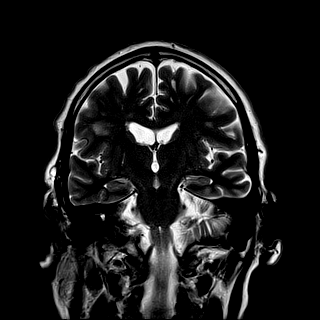
[im 32/32]
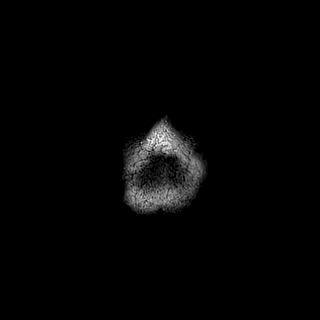

[42 of 48 positions shown; findings below may reference images not displayed]

FINDINGS: Brain: No acute infarction, hydrocephalus, extra-axial collection or
mass lesion. Elevated right parafalcine focus of susceptibility
artifact in the right frontal region corresponds to small subdural
bleed described on prior CT. No mass effect on the adjacent brain
parenchyma. Susceptibility artifact on the left side of the anterior
cerebral falx is related to ossification. Scattered foci of T2
hyperintensity are seen in the white matter of the cerebral
hemispheres, nonspecific, most likely related to chronic small
vessel ischemia.

Vascular: Normal flow voids.

Skull and upper cervical spine: Normal marrow signal.

Sinuses/Orbits: Negative.

Other: Area of decreased signal on T1 with hypointense focus in the
subcutaneous of the posterior aspect of the neck at the level of C3
only seen on sagittal T1 images, may be posttraumatic. Correlation
with direct exam suggested.
IMPRESSION: 1. No evidence of an acute or subacute infarct.
2. Small right parafalcine subdural hemorrhage, similar to recent
CT.
3. Mild chronic white matter changes, likely related to
microangiopathy.
4. Area of decreased signal on T1 in the subcutaneous of the
posterior aspect of the neck may be posttraumatic. Correlation with
direct exam suggested.

## 2023-10-25 ENCOUNTER — Encounter: Payer: Self-pay | Admitting: Nurse Practitioner

## 2023-10-25 ENCOUNTER — Ambulatory Visit (INDEPENDENT_AMBULATORY_CARE_PROVIDER_SITE_OTHER): Payer: Medicare HMO | Admitting: Nurse Practitioner

## 2023-10-25 VITALS — BP 146/81 | HR 88 | Temp 98.1°F | Ht 73.0 in | Wt 219.0 lb

## 2023-10-25 DIAGNOSIS — N400 Enlarged prostate without lower urinary tract symptoms: Secondary | ICD-10-CM

## 2023-10-25 DIAGNOSIS — I10 Essential (primary) hypertension: Secondary | ICD-10-CM

## 2023-10-25 DIAGNOSIS — Z6828 Body mass index (BMI) 28.0-28.9, adult: Secondary | ICD-10-CM | POA: Diagnosis not present

## 2023-10-25 DIAGNOSIS — E785 Hyperlipidemia, unspecified: Secondary | ICD-10-CM | POA: Diagnosis not present

## 2023-10-25 DIAGNOSIS — E1169 Type 2 diabetes mellitus with other specified complication: Secondary | ICD-10-CM

## 2023-10-25 DIAGNOSIS — R600 Localized edema: Secondary | ICD-10-CM

## 2023-10-25 DIAGNOSIS — Z7984 Long term (current) use of oral hypoglycemic drugs: Secondary | ICD-10-CM

## 2023-10-25 DIAGNOSIS — E119 Type 2 diabetes mellitus without complications: Secondary | ICD-10-CM | POA: Diagnosis not present

## 2023-10-25 LAB — BAYER DCA HB A1C WAIVED: HB A1C (BAYER DCA - WAIVED): 6.4 % — ABNORMAL HIGH (ref 4.8–5.6)

## 2023-10-25 LAB — LIPID PANEL

## 2023-10-25 MED ORDER — FUROSEMIDE 40 MG PO TABS: 40.0000 mg | ORAL_TABLET | Freq: Every day | ORAL | 3 refills | Status: DC

## 2023-10-25 MED ORDER — METFORMIN HCL 500 MG PO TABS: 500.0000 mg | ORAL_TABLET | Freq: Two times a day (BID) | ORAL | 1 refills | Status: DC

## 2023-10-25 MED ORDER — METOLAZONE 2.5 MG PO TABS: 2.5000 mg | ORAL_TABLET | Freq: Every day | ORAL | 0 refills | Status: AC

## 2023-10-25 MED ORDER — GLUCOSE BLOOD VI STRP: 1.0000 | ORAL_STRIP | 3 refills | Status: AC | PRN

## 2023-10-25 MED ORDER — FINASTERIDE 5 MG PO TABS
5.0000 mg | ORAL_TABLET | Freq: Every day | ORAL | 1 refills | Status: DC
Start: 2023-10-25 — End: 2024-04-26

## 2023-10-25 MED ORDER — ATORVASTATIN CALCIUM 40 MG PO TABS: 40.0000 mg | ORAL_TABLET | Freq: Every day | ORAL | 1 refills | Status: DC

## 2023-10-25 MED ORDER — LOSARTAN POTASSIUM 50 MG PO TABS: 50.0000 mg | ORAL_TABLET | Freq: Every day | ORAL | 1 refills | Status: DC

## 2023-10-25 NOTE — Progress Notes (Signed)
 Subjective:    Patient ID: Jonathon Adkins, male    DOB: 10-13-1950, 73 y.o.   MRN: [id number]   Chief Complaint: medical management of chronic issues     HPI:  Jonathon Adkins is a 73 y.o. who identifies as a male who was assigned male at birth.   Social history: Lives with: wife Work history: retired   Water engineer in today for follow up of the following chronic medical issues:  1. Primary hypertension No c/o chest pain, sob or headache. Does not check blood pressure at home. BP Readings from Last 3 Encounters:  05/05/23 137/76  11/05/22 133/76  11/02/22 132/77     2. Hyperlipidemia associated with type 2 diabetes mellitus (HCC) Does watch diet and tries  to stay active. Lab Results  Component Value Date   CHOL 123 05/05/2023   HDL 44 05/05/2023   LDLCALC 66 05/05/2023   TRIG 61 05/05/2023   CHOLHDL 2.8 05/05/2023   The ASCVD Risk score (Arnett DK, et al., 2019) failed to calculate for the following reasons:   The valid total cholesterol range is 130 to 320 mg/dL   3. Diabetes mellitus treated with oral medication (HCC) Fasting blod sugars are stable. Around 110-130. No low blood sugars Lab Results  Component Value Date   HGBA1C 6.3 (H) 05/05/2023     4. Benign prostatic hyperplasia without lower urinary tract symptoms No voiding issues Lab Results  Component Value Date   PSA1 6.3 (H) 05/05/2023   PSA1 5.4 (H) 11/02/2022   PSA1 4.7 (H) 08/03/2022      5. BMI 26.0-26.9,adult Weight is up18 lbs Wt Readings from Last 3 Encounters:  10/25/23 219 lb (99.3 kg)  05/05/23 201 lb (91.2 kg)  11/05/22 210 lb (95.3 kg)   BMI Readings from Last 3 Encounters:  10/25/23 28.89 kg/m  05/05/23 26.52 kg/m  11/05/22 27.71 kg/m       New complaints: None today  Allergies  Allergen Reactions   Codeine    Lisinopril    Outpatient Encounter Medications as of 10/25/2023  Medication Sig   amLODipine (NORVASC) 10 MG tablet Take 1 tablet (10 mg total) by mouth  daily.   atorvastatin (LIPITOR) 40 MG tablet Take 1 tablet (40 mg total) by mouth daily.   augmented betamethasone dipropionate (DIPROLENE-AF) 0.05 % ointment Apply 2 times daily to new blisters or itchy spots on scalp and body. Do not use on face, armpits or groin.   Blood Glucose Monitoring Suppl (ONETOUCH VERIO FLEX SYSTEM) w/Device KIT Test BS in morning, noon and at bedtime Dx !11.9   cholecalciferol (VITAMIN D) 1000 UNITS tablet Take 1 tablet (1,000 Units total) by mouth daily.   clobetasol ointment (TEMOVATE) 0.05 % Apply 2 times per day to blisters on scalp and body. Do not use on face.   finasteride (PROSCAR) 5 MG tablet Take 1 tablet (5 mg total) by mouth daily.   furosemide (LASIX) 20 MG tablet Take 1 tablet (20 mg total) by mouth daily.   losartan (COZAAR) 50 MG tablet Take 1 tablet (50 mg total) by mouth daily.   metFORMIN (GLUCOPHAGE) 500 MG tablet Take 1 tablet (500 mg total) by mouth 2 (two) times daily with a meal.   No facility-administered encounter medications on file as of 10/25/2023.    Past Surgical History:  Procedure Laterality Date   COLONOSCOPY N/A 02/06/2014   Procedure: COLONOSCOPY;  Surgeon: Malissa Hippo, MD;  Location: [facility];  Service: Endoscopy;  Laterality:  N/A;  100    Family History  Problem Relation Age of Onset   Diabetes Mother    Coronary artery disease Father       Controlled substance contract: n/a     Review of Systems  Constitutional:  Negative for diaphoresis.  Eyes:  Negative for pain.  Respiratory:  Negative for shortness of breath.   Cardiovascular:  Negative for chest pain, palpitations and leg swelling.  Gastrointestinal:  Negative for abdominal pain.  Endocrine: Negative for polydipsia.  Skin:  Negative for rash.  Neurological:  Negative for dizziness, weakness and headaches.  Hematological:  Does not bruise/bleed easily.  All other systems reviewed and are negative.      Objective:   Physical Exam Vitals and  nursing note reviewed.  Constitutional:      Appearance: Normal appearance. He is well-developed.  HENT:     Head: Normocephalic.     Nose: Nose normal.     Mouth/Throat:     Mouth: Mucous membranes are moist.     Pharynx: Oropharynx is clear.  Eyes:     Pupils: Pupils are equal, round, and reactive to light.  Neck:     Thyroid: No thyroid mass or thyromegaly.     Vascular: No carotid bruit or JVD.     Trachea: Phonation normal.  Cardiovascular:     Rate and Rhythm: Normal rate and regular rhythm.  Pulmonary:     Effort: Pulmonary effort is normal. No respiratory distress.     Breath sounds: Normal breath sounds.  Abdominal:     General: Bowel sounds are normal.     Palpations: Abdomen is soft.     Tenderness: There is no abdominal tenderness.  Musculoskeletal:        General: Normal range of motion.     Cervical back: Normal range of motion and neck supple.     Right lower leg: Edema (3+) present.     Left lower leg: Edema (2+) present.  Lymphadenopathy:     Cervical: No cervical adenopathy.  Skin:    General: Skin is warm and dry.  Neurological:     Mental Status: He is alert and oriented to person, place, and time.  Psychiatric:        Behavior: Behavior normal.        Thought Content: Thought content normal.        Judgment: Judgment normal.     BP (!) 146/81   Pulse 88   Temp 98.1 F (36.7 C) (Temporal)   Ht 6\' 1"  (1.854 m)   Wt 219 lb (99.3 kg)   SpO2 100%   BMI 28.89 kg/m    Hgba1c 6.4%     Assessment & Plan:   Jonathon Adkins comes in today with chief complaint of No chief complaint on file.   Diagnosis and orders addressed:  1. Primary hypertension Low sodium diet - CBC with Differential/Platelet - CMP14+EGFR - amLODipine (NORVASC) 10 MG tablet; Take 1 tablet (10 mg total) by mouth daily.  Dispense: 90 tablet; Refill: 1 - losartan (COZAAR) 50 MG tablet; Take 1 tablet (50 mg total) by mouth daily.  Dispense: 90 tablet; Refill: 1  2.  Hyperlipidemia associated with type 2 diabetes mellitus (HCC) Low fat diet - Lipid panel - atorvastatin (LIPITOR) 40 MG tablet; Take 1 tablet (40 mg total) by mouth daily.  Dispense: 90 tablet; Refill: 1  3. Diabetes mellitus treated with oral medication (HCC) Continue to watch carbs in diet - Bayer [facility]  Hb A1c Waived - metFORMIN (GLUCOPHAGE) 500 MG tablet; Take 1 tablet (500 mg total) by mouth 2 (two) times daily with a meal.  Dispense: 180 tablet; Refill: 1  4. Benign prostatic hyperplasia without lower urinary tract symptoms Report any voiding issues - PSA, total and free - finasteride (PROSCAR) 5 MG tablet; Take 1 tablet (5 mg total) by mouth daily.  Dispense: 90 tablet; Refill: 1  5. BMI 32.0-32.9,adult Some weight gain is probably fluid weight Discussed diet and exercise for person with BMI >25 Will recheck weight in 3-6 months   6. Peripheral edema Elevate legs when sitting Compression hose - or wrap legs Increase lasix to 40 mg daily Zaroxlyn 2.5mg - take 1 today- do  not take meds unless I tell yu to do so Daily weights- if 2lb weiht gain in 24 ours let me know - furosemide (LASIX) 40 MG tablet; Take 1 tablet (40 mg total) by mouth daily.  Dispense: 90 tablet; Refill: 1   Labs pending Health Maintenance reviewed Diet and exercise encouraged  Follow up plan: 6 months    Mary-Margaret Daphine Deutscher, FNP

## 2023-10-25 NOTE — Patient Instructions (Signed)
 Peripheral Edema  Peripheral edema is swelling that is caused by a buildup of fluid. Peripheral edema most often affects the lower legs, ankles, and feet. It can also develop in the arms, hands, and face. The area of the body that has peripheral edema will look swollen. It may also feel heavy or warm. Your clothes may start to feel tight. Pressing on the area may make a temporary dent in your skin (pitting edema). You may not be able to move your swollen arm or leg as much as usual. There are many causes of peripheral edema. It can happen because of a complication of other conditions such as heart failure, kidney disease, or a problem with your circulation. It also can be a side effect of certain medicines or happen because of an infection. It often happens to women during pregnancy. Sometimes, the cause is not known. Follow these instructions at home: Managing pain, stiffness, and swelling  Raise (elevate) your legs while you are sitting or lying down. Move around often to prevent stiffness and to reduce swelling. Do not sit or stand for long periods of time. Do not wear tight clothing. Do not wear garters on your upper legs. Exercise your legs to get your circulation going. This helps to move the fluid back into your blood vessels, and it may help the swelling go down. Wear compression stockings as told by your health care provider. These stockings help to prevent blood clots and reduce swelling in your legs. It is important that these are the correct size. These stockings should be prescribed by your doctor to prevent possible injuries. If elastic bandages or wraps are recommended, use them as told by your health care provider. Medicines Take over-the-counter and prescription medicines only as told by your health care provider. Your health care provider may prescribe medicine to help your body get rid of excess water (diuretic). Take this medicine if you are told to take it. General  instructions Eat a low-salt (low-sodium) diet as told by your health care provider. Sometimes, eating less salt may reduce swelling. Pay attention to any changes in your symptoms. Moisturize your skin daily to help prevent skin from cracking and draining. Keep all follow-up visits. This is important. Contact a health care provider if: You have a fever. You have swelling in only one leg. You have increased swelling, redness, or pain in one or both of your legs. You have drainage or sores at the area where you have edema. Get help right away if: You have edema that starts suddenly or is getting worse, especially if you are pregnant or have a medical condition. You develop shortness of breath, especially when you are lying down. You have pain in your chest or abdomen. You feel weak. You feel like you will faint. These symptoms may be an emergency. Get help right away. Call 911. Do not wait to see if the symptoms will go away. Do not drive yourself to the hospital. Summary Peripheral edema is swelling that is caused by a buildup of fluid. Peripheral edema most often affects the lower legs, ankles, and feet. Move around often to prevent stiffness and to reduce swelling. Do not sit or stand for long periods of time. Pay attention to any changes in your symptoms. Contact a health care provider if you have edema that starts suddenly or is getting worse, especially if you are pregnant or have a medical condition. Get help right away if you develop shortness of breath, especially when lying down.  This information is not intended to replace advice given to you by your health care provider. Make sure you discuss any questions you have with your health care provider. Document Revised: 04/13/2021 Document Reviewed: 04/13/2021 Elsevier Patient Education  2024 [employer].

## 2023-10-26 LAB — LIPID PANEL
Cholesterol, Total: 147 mg/dL (ref 100–199)
HDL: 47 mg/dL (ref >39)
LDL CALC COMMENT:: 3.1 ratio (ref 0.0–5.0)
LDL Chol Calc (NIH): 88 mg/dL (ref 0–99)
Triglycerides: 56 mg/dL (ref 0–149)
VLDL Cholesterol Cal: 12 mg/dL (ref 5–40)

## 2023-10-26 LAB — CBC WITH DIFFERENTIAL/PLATELET
Basophils Absolute: 0 10*3/uL (ref 0.0–0.2)
Basos: 1 %
EOS (ABSOLUTE): 0.2 10*3/uL (ref 0.0–0.4)
Eos: 5 %
Hematocrit: 35.3 % — ABNORMAL LOW (ref 37.5–51.0)
Hemoglobin: 11.3 g/dL — ABNORMAL LOW (ref 13.0–17.7)
Immature Grans (Abs): 0 10*3/uL (ref 0.0–0.1)
Immature Granulocytes: 0 %
Lymphocytes Absolute: 1.6 10*3/uL (ref 0.7–3.1)
Lymphs: 39 %
MCH: 26.3 pg — ABNORMAL LOW (ref 26.6–33.0)
MCHC: 32 g/dL (ref 31.5–35.7)
MCV: 82 fL (ref 79–97)
Monocytes Absolute: 0.4 10*3/uL (ref 0.1–0.9)
Monocytes: 9 %
Neutrophils Absolute: 1.8 10*3/uL (ref 1.4–7.0)
Neutrophils: 46 %
Platelets: 246 10*3/uL (ref 150–450)
RBC: 4.29 x10E6/uL (ref 4.14–5.80)
RDW: 14.8 % (ref 11.6–15.4)
WBC: 4 10*3/uL (ref 3.4–10.8)

## 2023-10-26 LAB — CMP14+EGFR
ALT: 20 IU/L (ref 0–44)
AST: 24 IU/L (ref 0–40)
Albumin: 4.2 g/dL (ref 3.8–4.8)
Alkaline Phosphatase: 108 IU/L (ref 44–121)
BUN/Creatinine Ratio: 13 (ref 10–24)
BUN: 18 mg/dL (ref 8–27)
Bilirubin Total: 0.9 mg/dL (ref 0.0–1.2)
CO2: 22 mmol/L (ref 20–29)
Calcium: 9.4 mg/dL (ref 8.6–10.2)
Chloride: 104 mmol/L (ref 96–106)
Creatinine, Ser: 1.38 mg/dL — ABNORMAL HIGH (ref 0.76–1.27)
Globulin, Total: 3 g/dL (ref 1.5–4.5)
Glucose: 135 mg/dL — ABNORMAL HIGH (ref 70–99)
Potassium: 4.2 mmol/L (ref 3.5–5.2)
Sodium: 141 mmol/L (ref 134–144)
Total Protein: 7.2 g/dL (ref 6.0–8.5)
eGFR: 54 mL/min/{1.73_m2} — ABNORMAL LOW (ref >59)

## 2023-10-26 LAB — MICROALBUMIN / CREATININE URINE RATIO
Creatinine, Urine: 102.5 mg/dL
Microalb/Creat Ratio: 151 mg/g{creat} — ABNORMAL HIGH (ref 0–29)
Microalbumin, Urine: 154.9 ug/mL

## 2023-11-30 ENCOUNTER — Other Ambulatory Visit: Payer: Self-pay | Admitting: Nurse Practitioner

## 2023-11-30 DIAGNOSIS — I10 Essential (primary) hypertension: Secondary | ICD-10-CM

## 2023-12-25 ENCOUNTER — Other Ambulatory Visit: Payer: Self-pay | Admitting: Nurse Practitioner

## 2023-12-25 DIAGNOSIS — L03115 Cellulitis of right lower limb: Secondary | ICD-10-CM

## 2023-12-26 NOTE — Telephone Encounter (Signed)
  The original prescription was discontinued on 10/25/2023 by Delfina Feller, FNP for the following reason: Dose change. Renewing this prescription may not be appropriate.

## 2024-01-20 ENCOUNTER — Other Ambulatory Visit: Payer: Self-pay | Admitting: Nurse Practitioner

## 2024-01-20 DIAGNOSIS — R600 Localized edema: Secondary | ICD-10-CM

## 2024-01-31 ENCOUNTER — Ambulatory Visit

## 2024-01-31 VITALS — BP 146/81 | HR 88 | Ht 73.0 in | Wt 219.0 lb

## 2024-01-31 DIAGNOSIS — Z0000 Encounter for general adult medical examination without abnormal findings: Secondary | ICD-10-CM | POA: Diagnosis not present

## 2024-01-31 NOTE — Patient Instructions (Signed)
 Jonathon Adkins , Thank you for taking time out of your busy schedule to complete your Annual Wellness Visit with me. I enjoyed our conversation and look forward to speaking with you again next year. I, as well as your care team,  appreciate your ongoing commitment to your health goals. Please review the following plan we discussed and let me know if I can assist you in the future. Your Game plan/ To Do List    Follow up Visits: Next Medicare AWV with our clinical staff: 01/31/25 at 12:30p.m.   Have you seen your provider in the last 6 months (3 months if uncontrolled diabetes)? Yes Next Office Visit with your provider: 04/26/24 at 8:15a.m.  Clinician Recommendations:  Aim for 30 minutes of exercise or brisk walking, 6-8 glasses of water , and 5 servings of fruits and vegetables each day. Please make sure the get the following done at your next visit: shingles vaccine and foot exam. Make sure to have diabetic eye appointment made and fax us  the visit report so we can update your chart.       This is a list of the screening recommended for you and due dates:  Health Maintenance  Topic Date Due   Zoster (Shingles) Vaccine (1 of 2) Never done   Complete foot exam   08/04/2023   Eye exam for diabetics  10/09/2023   Colon Cancer Screening  02/07/2024   DTaP/Tdap/Td vaccine (1 - Tdap) 10/24/2024*   COVID-19 Vaccine (4 - 2024-25 season) 02/15/2025*   Flu Shot  03/23/2024   Hemoglobin A1C  04/26/2024   Yearly kidney function blood test for diabetes  10/24/2024   Yearly kidney health urinalysis for diabetes  10/24/2024   Medicare Annual Wellness Visit  01/30/2025   Pneumonia Vaccine  Completed   Hepatitis C Screening  Completed   HPV Vaccine  Aged Out   Meningitis B Vaccine  Aged Out  *Topic was postponed. The date shown is not the original due date.    Advanced directives: (Declined) Advance directive discussed with you today. Even though you declined this today, please call our office should you  change your mind, and we can give you the proper paperwork for you to fill out. Advance Care Planning is important because it:  [x]  Makes sure you receive the medical care that is consistent with your values, goals, and preferences  [x]  It provides guidance to your family and loved ones and reduces their decisional burden about whether or not they are making the right decisions based on your wishes.  Follow the link provided in your after visit summary or read over the paperwork we have mailed to you to help you started getting your Advance Directives in place. If you need assistance in completing these, please reach out to us  so that we can help you!  See attachments for Preventive Care and Fall Prevention Tips.

## 2024-01-31 NOTE — Progress Notes (Signed)
 Subjective:   Jonathon Adkins is a 73 y.o. who presents for a Medicare Wellness preventive visit.  As a reminder, Annual Wellness Visits don't include a physical exam, and some assessments may be limited, especially if this visit is performed virtually. We may recommend an in-person follow-up visit with your provider if needed.  Visit Complete: Virtual I connected with  Jonathon Adkins on 01/31/24 by a audio enabled telemedicine application and verified that I am speaking with the correct person using two identifiers.  Patient Location: Home  Provider Location: Home Office  I discussed the limitations of evaluation and management by telemedicine. The patient expressed understanding and agreed to proceed.  Vital Signs: Because this visit was a virtual/telehealth visit, some criteria may be missing or patient reported. Any vitals not documented were not able to be obtained and vitals that have been documented are patient reported.  VideoDeclined- This patient declined Librarian, academic. Therefore the visit was completed with audio only.  Persons Participating in Visit: Patient.  AWV Questionnaire: No: Patient Medicare AWV questionnaire was not completed prior to this visit.  Cardiac Risk Factors include: advanced age (>70men, >19 women);male gender;diabetes mellitus;dyslipidemia;hypertension     Objective:     Today's Vitals   01/31/24 1304  BP: (!) 146/81  Pulse: 88  Weight: 219 lb (99.3 kg)  Height: 6\' 1"  (1.854 m)   Body mass index is 28.89 kg/m.     01/31/2024    1:26 PM 10/18/2022    8:28 AM 10/14/2021    8:22 AM 04/08/2021   11:23 AM 09/18/2014    9:47 AM 06/12/2014    9:24 AM 02/06/2014   11:45 AM  Advanced Directives  Does Patient Have a Medical Advance Directive? No No No No No No Patient does not have advance directive;Patient would like information  Would patient like information on creating a medical advance directive?  No - Patient  declined No - Patient declined  Yes - Transport planner given Yes - Transport planner given Advance directive packet given  Pre-existing out of facility DNR order (yellow form or pink MOST form)       No    Current Medications (verified) Outpatient Encounter Medications as of 01/31/2024  Medication Sig   amLODipine  (NORVASC ) 10 MG tablet TAKE 1 TABLET BY MOUTH EVERY DAY   atorvastatin  (LIPITOR) 40 MG tablet Take 1 tablet (40 mg total) by mouth daily.   augmented betamethasone dipropionate (DIPROLENE-AF) 0.05 % ointment Apply 2 times daily to new blisters or itchy spots on scalp and body. Do not use on face, armpits or groin.   Blood Glucose Monitoring Suppl (ONETOUCH VERIO FLEX SYSTEM) w/Device KIT Test BS in morning, noon and at bedtime Dx !11.9   cholecalciferol (VITAMIN D ) 1000 UNITS tablet Take 1 tablet (1,000 Units total) by mouth daily.   clobetasol ointment (TEMOVATE) 0.05 % Apply 2 times per day to blisters on scalp and body. Do not use on face.   finasteride  (PROSCAR ) 5 MG tablet Take 1 tablet (5 mg total) by mouth daily.   furosemide  (LASIX ) 40 MG tablet TAKE 1 TABLET BY MOUTH EVERY DAY   glucose blood test strip 1 each by Other route as needed for other. Use to check blood sugar TID   losartan  (COZAAR ) 50 MG tablet Take 1 tablet (50 mg total) by mouth daily.   metFORMIN  (GLUCOPHAGE ) 500 MG tablet Take 1 tablet (500 mg total) by mouth 2 (two) times daily with a meal.  metolazone  (ZAROXOLYN ) 2.5 MG tablet Take 1 tablet (2.5 mg total) by mouth daily.   No facility-administered encounter medications on file as of 01/31/2024.    Allergies (verified) Codeine and Lisinopril    History: Past Medical History:  Diagnosis Date   Diabetes mellitus without complication (HCC)    Enlarged prostate    Hyperlipidemia    Hypertension    Past Surgical History:  Procedure Laterality Date   COLONOSCOPY N/A 02/06/2014   Procedure: COLONOSCOPY;  Surgeon: Ruby Corporal, MD;  [facility]  [facility];  Service: Endoscopy;  Laterality: N/A;  100   Family History  Problem Relation Age of Onset   Diabetes Mother    Coronary artery disease Father    Social History   Socioeconomic History   Marital status: Married    Spouse name: Cato Cockayne   Number of children: 1   Years of education: Not on file   Highest education level: Not on file  Occupational History   Not on file  Tobacco Use   Smoking status: Never   Smokeless tobacco: Never  Substance and Sexual Activity   Alcohol use: No   Drug use: No   Sexual activity: Not Currently  Other Topics Concern   Not on file  Social History Narrative   Married x 39 years 04/29/2022.   1 daughter, lives in [place].   Social Drivers of Corporate investment banker Strain: Low Risk  (01/31/2024)   Overall Financial Resource Strain (CARDIA)    Difficulty of Paying Living Expenses: Not hard at all  Food Insecurity: No Food Insecurity (01/31/2024)   Hunger Vital Sign    Worried About Running Out of Food in the Last Year: Never true    Ran Out of Food in the Last Year: Never true  Transportation Needs: No Transportation Needs (10/18/2022)   PRAPARE - Administrator, Civil Service (Medical): No    Lack of Transportation (Non-Medical): No  Physical Activity: Sufficiently Active (01/31/2024)   Exercise Vital Sign    Days of Exercise per Week: 3 days    Minutes of Exercise per Session: 50 min  Stress: No Stress Concern Present (01/31/2024)   [employer] of Occupational Health - Occupational Stress Questionnaire    Feeling of Stress : Not at all  Social Connections: Socially Integrated (01/31/2024)   Social Connection and Isolation Panel [NHANES]    Frequency of Communication with Friends and Family: More than three times a week    Frequency of Social Gatherings with Friends and Family: More than three times a week    Attends Religious Services: More than 4 times per year    Active Member of [employer] or Organizations: Yes     Attends Engineer, structural: More than 4 times per year    Marital Status: Married    Tobacco Counseling Counseling given: Yes    Clinical Intake:  Pre-visit preparation completed: Yes  Pain : No/denies pain     BMI - recorded: 28.89 Nutritional Status: BMI 25 -29 Overweight Nutritional Risks: None Diabetes: Yes CBG done?: No  Lab Results  Component Value Date   HGBA1C 6.4 (H) 10/25/2023   HGBA1C 6.3 (H) 05/05/2023   HGBA1C 6.8 (H) 11/02/2022     How often do you need to have someone help you when you read instructions, pamphlets, or other written materials from your doctor or pharmacy?: 1 - Never  Interpreter Needed?: No  Information entered by :: alia t/cma   Activities of  Daily Living     01/31/2024    1:12 PM  In your present state of health, do you have any difficulty performing the following activities:  Hearing? 0  Vision? 0  Difficulty concentrating or making decisions? 0  Walking or climbing stairs? 0  Dressing or bathing? 0  Doing errands, shopping? 0  Preparing Food and eating ? N  Using the Toilet? N  In the past six months, have you accidently leaked urine? N  Do you have problems with loss of bowel control? N  Managing your Medications? N  Managing your Finances? N  Housekeeping or managing your Housekeeping? N    Patient Care Team: Delfina Feller, FNP as PCP - General (Nurse Practitioner)  I have updated your Care Teams any recent Medical Services you may have received from other providers in the past year.     Assessment:    This is a routine wellness examination for Jonathon Adkins.  Hearing/Vision screen Hearing Screening - Comments:: Pt denies hearing dif Vision Screening - Comments:: pt goes Dr. Lavern Potash appt in 8/25   Goals Addressed             This Visit's Progress    Exercise 3x per week (30 min per time)   On track    Continue to increase exercise. Fish more.       Depression Screen      01/31/2024    1:23 PM 10/25/2023    8:15 AM 05/05/2023    9:34 AM 11/05/2022    8:50 AM 11/02/2022    9:34 AM 10/18/2022    8:27 AM 08/03/2022    8:40 AM  PHQ 2/9 Scores  PHQ - 2 Score 0 0 0 0 0 0 0  PHQ- 9 Score   0 0 0  0    Fall Risk     01/31/2024    1:18 PM 10/25/2023    8:15 AM 05/05/2023    9:33 AM 11/05/2022    8:50 AM 11/02/2022    9:33 AM  Fall Risk   Falls in the past year? 0 0 0 0 0  Number falls in past yr: 0    0  Injury with Fall? 0    0  Risk for fall due to : No Fall Risks    No Fall Risks  Follow up Falls evaluation completed    Falls evaluation completed    MEDICARE RISK AT HOME:  Medicare Risk at Home Any stairs in or around the home?: No If so, are there any without handrails?: No Home free of loose throw rugs in walkways, pet beds, electrical cords, etc?: Yes Adequate lighting in your home to reduce risk of falls?: Yes Life alert?: No Use of a cane, walker or w/c?: No Grab bars in the bathroom?: Yes Shower chair or bench in shower?: Yes Elevated toilet seat or a handicapped toilet?: Yes  TIMED UP AND GO:  Was the test performed?  no  Cognitive Function: 6CIT completed        01/31/2024    1:24 PM 10/18/2022    8:29 AM 10/14/2021    8:28 AM  6CIT Screen  What Year? 0 points 0 points 0 points  What month? 0 points 0 points 0 points  What time? 0 points 0 points 0 points  Count back from 20 0 points 0 points 0 points  Months in reverse 0 points 0 points 0 points  Repeat phrase 8 points 0 points 2 points  Total  Score 8 points 0 points 2 points    Immunizations Immunization History  Administered Date(s) Administered   Influenza,inj,Quad PF,6+ Mos 07/10/2015   Moderna Sars-Covid-2 Vaccination 01/26/2020, 02/23/2020, 09/22/2020   Pneumococcal Conjugate-13 10/13/2015   Pneumococcal Polysaccharide-23 03/02/2015    Screening Tests Health Maintenance  Topic Date Due   Zoster Vaccines- Shingrix (1 of 2) Never done   FOOT EXAM  08/04/2023    OPHTHALMOLOGY EXAM  10/09/2023   Colonoscopy  02/07/2024   DTaP/Tdap/Td (1 - Tdap) 10/24/2024 (Originally 03/16/1970)   COVID-19 Vaccine (4 - 2024-25 season) 02/15/2025 (Originally 04/24/2023)   INFLUENZA VACCINE  03/23/2024   HEMOGLOBIN A1C  04/26/2024   Diabetic kidney evaluation - eGFR measurement  10/24/2024   Diabetic kidney evaluation - Urine ACR  10/24/2024   Medicare Annual Wellness (AWV)  01/30/2025   Pneumonia Vaccine 43+ Years old  Completed   Hepatitis C Screening  Completed   HPV VACCINES  Aged Out   Meningococcal B Vaccine  Aged Out    Health Maintenance  Health Maintenance Due  Topic Date Due   Zoster Vaccines- Shingrix (1 of 2) Never done   FOOT EXAM  08/04/2023   OPHTHALMOLOGY EXAM  10/09/2023   Colonoscopy  02/07/2024   Health Maintenance Items Addressed: See Nurse Notes at the end of this note  Additional Screening:  Vision Screening: Recommended annual ophthalmology exams for early detection of glaucoma and other disorders of the eye. Would you like a referral to an eye doctor? No    Dental Screening: Recommended annual dental exams for proper oral hygiene  Community Resource Referral / Chronic Care Management: CRR required this visit?  No   CCM required this visit?  No   Plan:    I have personally reviewed and noted the following in the patient's chart:   Medical and social history Use of alcohol, tobacco or illicit drugs  Current medications and supplements including opioid prescriptions. Patient is not currently taking opioid prescriptions. Functional ability and status Nutritional status Physical activity Advanced directives List of other physicians Hospitalizations, surgeries, and ER visits in previous 12 months Vitals Screenings to include cognitive, depression, and falls Referrals and appointments  In addition, I have reviewed and discussed with patient certain preventive protocols, quality metrics, and best practice recommendations. A  written personalized care plan for preventive services as well as general preventive health recommendations were provided to patient.   Michaelle Adolphus, CMA   01/31/2024   After Visit Summary: (MyChart) Due to this being a telephonic visit, the after visit summary with patients personalized plan was offered to patient via MyChart   Notes:  Please make sure the get the following done at your next visit: shingles vaccine and foot exam. Make sure to have diabetic eye appointment made and fax us  the visit report so we can update your chart.

## 2024-02-27 ENCOUNTER — Other Ambulatory Visit: Payer: Self-pay | Admitting: Nurse Practitioner

## 2024-02-27 DIAGNOSIS — E1169 Type 2 diabetes mellitus with other specified complication: Secondary | ICD-10-CM

## 2024-04-17 DIAGNOSIS — H524 Presbyopia: Secondary | ICD-10-CM | POA: Diagnosis not present

## 2024-04-17 LAB — HM DIABETES EYE EXAM

## 2024-04-26 ENCOUNTER — Encounter: Payer: Self-pay | Admitting: Nurse Practitioner

## 2024-04-26 ENCOUNTER — Ambulatory Visit: Admitting: Nurse Practitioner

## 2024-04-26 VITALS — BP 124/76 | HR 91 | Temp 97.4°F | Ht 73.0 in | Wt 214.0 lb

## 2024-04-26 DIAGNOSIS — Z0001 Encounter for general adult medical examination with abnormal findings: Secondary | ICD-10-CM | POA: Diagnosis not present

## 2024-04-26 DIAGNOSIS — Z7984 Long term (current) use of oral hypoglycemic drugs: Secondary | ICD-10-CM

## 2024-04-26 DIAGNOSIS — Z0000 Encounter for general adult medical examination without abnormal findings: Secondary | ICD-10-CM

## 2024-04-26 DIAGNOSIS — E119 Type 2 diabetes mellitus without complications: Secondary | ICD-10-CM | POA: Diagnosis not present

## 2024-04-26 DIAGNOSIS — E785 Hyperlipidemia, unspecified: Secondary | ICD-10-CM | POA: Diagnosis not present

## 2024-04-26 DIAGNOSIS — E1169 Type 2 diabetes mellitus with other specified complication: Secondary | ICD-10-CM

## 2024-04-26 DIAGNOSIS — Z6832 Body mass index (BMI) 32.0-32.9, adult: Secondary | ICD-10-CM

## 2024-04-26 DIAGNOSIS — I10 Essential (primary) hypertension: Secondary | ICD-10-CM | POA: Diagnosis not present

## 2024-04-26 DIAGNOSIS — R600 Localized edema: Secondary | ICD-10-CM | POA: Diagnosis not present

## 2024-04-26 DIAGNOSIS — N400 Enlarged prostate without lower urinary tract symptoms: Secondary | ICD-10-CM | POA: Diagnosis not present

## 2024-04-26 LAB — LIPID PANEL

## 2024-04-26 LAB — BAYER DCA HB A1C WAIVED: HB A1C (BAYER DCA - WAIVED): 6.4 % — ABNORMAL HIGH (ref 4.8–5.6)

## 2024-04-26 MED ORDER — LOSARTAN POTASSIUM 50 MG PO TABS: 50.0000 mg | ORAL_TABLET | Freq: Every day | ORAL | 1 refills | Status: AC

## 2024-04-26 MED ORDER — AMLODIPINE BESYLATE 10 MG PO TABS: 10.0000 mg | ORAL_TABLET | Freq: Every day | ORAL | 1 refills | Status: AC

## 2024-04-26 MED ORDER — FINASTERIDE 5 MG PO TABS: 5.0000 mg | ORAL_TABLET | Freq: Every day | ORAL | 1 refills | Status: AC

## 2024-04-26 MED ORDER — METFORMIN HCL 500 MG PO TABS: 500.0000 mg | ORAL_TABLET | Freq: Two times a day (BID) | ORAL | 1 refills | Status: AC

## 2024-04-26 MED ORDER — FUROSEMIDE 40 MG PO TABS: 40.0000 mg | ORAL_TABLET | Freq: Every day | ORAL | 1 refills | Status: AC

## 2024-04-26 MED ORDER — ATORVASTATIN CALCIUM 40 MG PO TABS
40.0000 mg | ORAL_TABLET | Freq: Every day | ORAL | 1 refills | Status: AC
Start: 2024-04-26 — End: ?

## 2024-04-26 NOTE — Patient Instructions (Signed)

## 2024-04-26 NOTE — Progress Notes (Signed)
 Subjective:    Patient ID: Jonathon Adkins, male    DOB: Oct 17, 1950, 73 y.o.   MRN: [id number]   Chief Complaint: annual physical     HPI:  Jonathon Adkins is a 73 y.o. who identifies as a male who was assigned male at birth.   Social history: Lives with: wife Work history: retired   Water engineer in today for follow up of the following chronic medical issues:  1. Primary hypertension No c/o chest pain, sob or headache. Does not check blood pressure at home. BP Readings from Last 3 Encounters:  01/31/24 (!) 146/81  10/25/23 (!) 146/81  05/05/23 137/76     2. Hyperlipidemia associated with type 2 diabetes mellitus (HCC) Does watch diet and tries  to stay active. Lab Results  Component Value Date   CHOL 147 10/25/2023   HDL 47 10/25/2023   LDLCALC 88 10/25/2023   TRIG 56 10/25/2023   CHOLHDL 3.1 10/25/2023   The 10-year ASCVD risk score (Arnett DK, et al., 2019) is: 43.4%   3. Diabetes mellitus treated with oral medication (HCC) Fasting blod sugars are stable. Around 110-130. No low blood sugars Lab Results  Component Value Date   HGBA1C 6.4 (H) 10/25/2023     4. Benign prostatic hyperplasia without lower urinary tract symptoms No voiding issues Lab Results  Component Value Date   PSA1 6.3 (H) 05/05/2023   PSA1 5.4 (H) 11/02/2022   PSA1 4.7 (H) 08/03/2022      5. BMI 26.0-26.9,adult Weight is down 5 lbs  Wt Readings from Last 3 Encounters:  04/26/24 214 lb (97.1 kg)  01/31/24 219 lb (99.3 kg)  10/25/23 219 lb (99.3 kg)   BMI Readings from Last 3 Encounters:  04/26/24 28.23 kg/m  01/31/24 28.89 kg/m  10/25/23 28.89 kg/m        New complaints: None today  Allergies  Allergen Reactions   Codeine    Lisinopril     Outpatient Encounter Medications as of 04/26/2024  Medication Sig   amLODipine  (NORVASC ) 10 MG tablet TAKE 1 TABLET BY MOUTH EVERY DAY   atorvastatin  (LIPITOR) 40 MG tablet TAKE 1 TABLET BY MOUTH EVERY DAY   augmented betamethasone  dipropionate (DIPROLENE-AF) 0.05 % ointment Apply 2 times daily to new blisters or itchy spots on scalp and body. Do not use on face, armpits or groin.   Blood Glucose Monitoring Suppl (ONETOUCH VERIO FLEX SYSTEM) w/Device KIT Test BS in morning, noon and at bedtime Dx !11.9   cholecalciferol (VITAMIN D ) 1000 UNITS tablet Take 1 tablet (1,000 Units total) by mouth daily.   clobetasol ointment (TEMOVATE) 0.05 % Apply 2 times per day to blisters on scalp and body. Do not use on face.   finasteride  (PROSCAR ) 5 MG tablet Take 1 tablet (5 mg total) by mouth daily.   furosemide  (LASIX ) 40 MG tablet TAKE 1 TABLET BY MOUTH EVERY DAY   glucose blood test strip 1 each by Other route as needed for other. Use to check blood sugar TID   losartan  (COZAAR ) 50 MG tablet Take 1 tablet (50 mg total) by mouth daily.   metFORMIN  (GLUCOPHAGE ) 500 MG tablet Take 1 tablet (500 mg total) by mouth 2 (two) times daily with a meal.   metolazone  (ZAROXOLYN ) 2.5 MG tablet Take 1 tablet (2.5 mg total) by mouth daily.   No facility-administered encounter medications on file as of 04/26/2024.    Past Surgical History:  Procedure Laterality Date   COLONOSCOPY N/A 02/06/2014   Procedure:  COLONOSCOPY;  Surgeon: Claudis RAYMOND Rivet, MD;  Location: [facility];  Service: Endoscopy;  Laterality: N/A;  100    Family History  Problem Relation Age of Onset   Diabetes Mother    Coronary artery disease Father       Controlled substance contract: n/a     Review of Systems  Constitutional:  Negative for diaphoresis.  Eyes:  Negative for pain.  Respiratory:  Negative for shortness of breath.   Cardiovascular:  Negative for chest pain, palpitations and leg swelling.  Gastrointestinal:  Negative for abdominal pain.  Endocrine: Negative for polydipsia.  Skin:  Negative for rash.  Neurological:  Negative for dizziness, weakness and headaches.  Hematological:  Does not bruise/bleed easily.  All other systems reviewed and are  negative.      Objective:   Physical Exam Vitals and nursing note reviewed.  Constitutional:      Appearance: Normal appearance. He is well-developed.  HENT:     Head: Normocephalic.     Nose: Nose normal.     Mouth/Throat:     Mouth: Mucous membranes are moist.     Pharynx: Oropharynx is clear.  Eyes:     Pupils: Pupils are equal, round, and reactive to light.  Neck:     Thyroid: No thyroid mass or thyromegaly.     Vascular: No carotid bruit or JVD.     Trachea: Phonation normal.  Cardiovascular:     Rate and Rhythm: Normal rate and regular rhythm.  Pulmonary:     Effort: Pulmonary effort is normal. No respiratory distress.     Breath sounds: Normal breath sounds.  Abdominal:     General: Bowel sounds are normal.     Palpations: Abdomen is soft.     Tenderness: There is no abdominal tenderness.  Musculoskeletal:        General: Normal range of motion.     Cervical back: Normal range of motion and neck supple.     Right lower leg: Edema (3+) present.     Left lower leg: Edema (2+) present.  Lymphadenopathy:     Cervical: No cervical adenopathy.  Skin:    General: Skin is warm and dry.  Neurological:     Mental Status: He is alert and oriented to person, place, and time.  Psychiatric:        Behavior: Behavior normal.        Thought Content: Thought content normal.        Judgment: Judgment normal.     BP 124/76   Pulse 91   Temp (!) 97.4 F (36.3 C) (Temporal)   Ht 6' 1 (1.854 m)   Wt 214 lb (97.1 kg)   SpO2 94%   BMI 28.23 kg/m     Hgba1c 6.4%     Assessment & Plan:   Jonathon Adkins comes in today with chief complaint of annual physical  Diagnosis and orders addressed:  1. Primary hypertension Low sodium diet - CBC with Differential/Platelet - CMP14+EGFR - amLODipine  (NORVASC ) 10 MG tablet; Take 1 tablet (10 mg total) by mouth daily.  Dispense: 90 tablet; Refill: 1 - losartan  (COZAAR ) 50 MG tablet; Take 1 tablet (50 mg total) by mouth daily.   Dispense: 90 tablet; Refill: 1  2. Hyperlipidemia associated with type 2 diabetes mellitus (HCC) Low fat diet - Lipid panel - atorvastatin  (LIPITOR) 40 MG tablet; Take 1 tablet (40 mg total) by mouth daily.  Dispense: 90 tablet; Refill: 1  3. Diabetes mellitus  treated with oral medication (HCC) Continue to watch carbs in diet - Bayer DCA Hb A1c Waived - metFORMIN  (GLUCOPHAGE ) 500 MG tablet; Take 1 tablet (500 mg total) by mouth 2 (two) times daily with a meal.  Dispense: 180 tablet; Refill: 1  4. Benign prostatic hyperplasia without lower urinary tract symptoms Report any voiding issues - PSA, total and free - finasteride  (PROSCAR ) 5 MG tablet; Take 1 tablet (5 mg total) by mouth daily.  Dispense: 90 tablet; Refill: 1  5. BMI 32.0-32.9,adult Some weight gain is probably fluid weight Discussed diet and exercise for person with BMI >25 Will recheck weight in 3-6 months   6. Peripheral edema Elevate legs when sitting Compression hose - or wrap legs Increase lasix  to 40 mg daily Zaroxlyn 2.5mg - take 1 today- do  not take meds unless I tell yu to do so Daily weights- if 2lb weiht gain in 24 ours let me know - furosemide  (LASIX ) 40 MG tablet; Take 1 tablet (40 mg total) by mouth daily.  Dispense: 90 tablet; Refill: 1   Labs pending Health Maintenance reviewed Diet and exercise encouraged  Follow up plan: 6 months    Mary-Margaret Gladis, FNP

## 2024-04-27 ENCOUNTER — Ambulatory Visit: Payer: Self-pay | Admitting: Nurse Practitioner

## 2024-04-27 LAB — PSA, TOTAL AND FREE
PSA, Free Pct: 24.7
PSA, Free: 1.26 ng/mL
Prostate Specific Ag, Serum: 5.1 ng/mL — AB (ref 0.0–4.0)

## 2024-04-27 LAB — CMP14+EGFR
ALT: 17 IU/L (ref 0–44)
AST: 20 IU/L (ref 0–40)
Albumin: 4.2 g/dL (ref 3.8–4.8)
Alkaline Phosphatase: 102 IU/L (ref 44–121)
BUN/Creatinine Ratio: 14 (ref 10–24)
BUN: 21 mg/dL (ref 8–27)
Bilirubin Total: 0.8 mg/dL (ref 0.0–1.2)
CO2: 21 mmol/L (ref 20–29)
Calcium: 9.8 mg/dL (ref 8.6–10.2)
Chloride: 103 mmol/L (ref 96–106)
Creatinine, Ser: 1.48 mg/dL — AB (ref 0.76–1.27)
Globulin, Total: 3.4 g/dL (ref 1.5–4.5)
Glucose: 109 mg/dL — AB (ref 70–99)
Potassium: 4.6 mmol/L (ref 3.5–5.2)
Sodium: 141 mmol/L (ref 134–144)
Total Protein: 7.6 g/dL (ref 6.0–8.5)
eGFR: 49 mL/min/1.73 — AB (ref >59)

## 2024-04-27 LAB — LIPID PANEL
Cholesterol, Total: 134 mg/dL (ref 100–199)
HDL: 43 mg/dL (ref >39)
LDL CALC COMMENT:: 3.1 ratio (ref 0.0–5.0)
LDL Chol Calc (NIH): 75 mg/dL (ref 0–99)
Triglycerides: 83 mg/dL (ref 0–149)
VLDL Cholesterol Cal: 16 mg/dL (ref 5–40)

## 2024-04-27 LAB — CBC WITH DIFFERENTIAL/PLATELET
Basophils Absolute: 0.1 x10E3/uL (ref 0.0–0.2)
Basos: 1 %
EOS (ABSOLUTE): 0.2 x10E3/uL (ref 0.0–0.4)
Eos: 5 %
Hematocrit: 38.8 % (ref 37.5–51.0)
Hemoglobin: 11.7 g/dL — ABNORMAL LOW (ref 13.0–17.7)
Immature Grans (Abs): 0 x10E3/uL (ref 0.0–0.1)
Immature Granulocytes: 0 %
Lymphocytes Absolute: 1.4 x10E3/uL (ref 0.7–3.1)
Lymphs: 34 %
MCH: 25.9 pg — ABNORMAL LOW (ref 26.6–33.0)
MCHC: 30.2 g/dL — ABNORMAL LOW (ref 31.5–35.7)
MCV: 86 fL (ref 79–97)
Monocytes Absolute: 0.5 x10E3/uL (ref 0.1–0.9)
Monocytes: 11 %
Neutrophils Absolute: 2 x10E3/uL (ref 1.4–7.0)
Neutrophils: 49 %
Platelets: 256 x10E3/uL (ref 150–450)
RBC: 4.52 x10E6/uL (ref 4.14–5.80)
RDW: 15.4 % (ref 11.6–15.4)
WBC: 4.1 x10E3/uL (ref 3.4–10.8)

## 2024-04-27 LAB — VITAMIN B12: Vitamin B-12: 331 pg/mL (ref 232–1245)

## 2024-06-08 NOTE — Progress Notes (Addendum)
 Jonathon Adkins                                          MRN: [id number]   06/08/2024   The [facility] Quality Team Specialist reviewed this patient medical record for the purposes of chart review for care gap closure. The following were reviewed: abstraction for care gap closure-controlling blood pressure.   Jonathon Adkins                                          MRN: [id number]   09/27/2024   The [facility] Quality Team Specialist reviewed this patient medical record for the purposes of chart review for care gap closure. The following were reviewed: chart review for care gap closure-diabetic eye exam.    [facility] Quality Team

## 2024-06-11 DIAGNOSIS — R972 Elevated prostate specific antigen [PSA]: Secondary | ICD-10-CM | POA: Diagnosis not present

## 2024-06-18 DIAGNOSIS — N400 Enlarged prostate without lower urinary tract symptoms: Secondary | ICD-10-CM | POA: Diagnosis not present

## 2024-06-18 DIAGNOSIS — R972 Elevated prostate specific antigen [PSA]: Secondary | ICD-10-CM | POA: Diagnosis not present

## 2024-06-18 DIAGNOSIS — R399 Unspecified symptoms and signs involving the genitourinary system: Secondary | ICD-10-CM | POA: Diagnosis not present

## 2024-10-22 ENCOUNTER — Ambulatory Visit: Payer: Self-pay | Admitting: Nurse Practitioner

## 2025-01-31 ENCOUNTER — Ambulatory Visit: Payer: Self-pay

## 2025-02-12 ENCOUNTER — Ambulatory Visit: Payer: PRIVATE HEALTH INSURANCE
